# Patient Record
Sex: Female | Born: 1970
Health system: Southern US, Community
[De-identification: ages and names within clinical notes are randomized; demographics above are authoritative.]

## PROBLEM LIST (undated history)

## (undated) DIAGNOSIS — I10 Essential (primary) hypertension: Secondary | ICD-10-CM

## (undated) DIAGNOSIS — D649 Anemia, unspecified: Secondary | ICD-10-CM

## (undated) DIAGNOSIS — I2510 Atherosclerotic heart disease of native coronary artery without angina pectoris: Secondary | ICD-10-CM

## (undated) DIAGNOSIS — E119 Type 2 diabetes mellitus without complications: Secondary | ICD-10-CM

## (undated) DIAGNOSIS — F32A Depression, unspecified: Secondary | ICD-10-CM

## (undated) DIAGNOSIS — G4730 Sleep apnea, unspecified: Secondary | ICD-10-CM

## (undated) DIAGNOSIS — Z9981 Dependence on supplemental oxygen: Secondary | ICD-10-CM

## (undated) HISTORY — PX: CATARACT EXTRACTION: SUR2

## (undated) HISTORY — DX: Atherosclerotic heart disease of native coronary artery without angina pectoris: I25.10

---

## 2012-12-01 ENCOUNTER — Ambulatory Visit: Payer: Self-pay | Admitting: Family Medicine

## 2019-03-03 ENCOUNTER — Other Ambulatory Visit: Payer: Self-pay | Admitting: Family

## 2019-03-03 DIAGNOSIS — Z1231 Encounter for screening mammogram for malignant neoplasm of breast: Secondary | ICD-10-CM

## 2019-12-16 ENCOUNTER — Emergency Department (HOSPITAL_COMMUNITY)
Admission: EM | Admit: 2019-12-16 | Discharge: 2019-12-16 | Disposition: A | Discharge status: Home / Self Care | Payer: Medicare Other | Source: Home / Self Care

## 2019-12-16 ENCOUNTER — Other Ambulatory Visit: Payer: Self-pay

## 2019-12-16 ENCOUNTER — Inpatient Hospital Stay (HOSPITAL_COMMUNITY)
Admission: EM | Admit: 2019-12-16 | Discharge: 2019-12-22 | DRG: 291 | Disposition: A | Discharge status: Home Health | Payer: Medicare Other | Attending: Internal Medicine | Admitting: Internal Medicine

## 2019-12-16 ENCOUNTER — Encounter (HOSPITAL_COMMUNITY): Payer: Self-pay | Admitting: Pediatrics

## 2019-12-16 ENCOUNTER — Emergency Department (HOSPITAL_COMMUNITY): Payer: Medicare Other

## 2019-12-16 ENCOUNTER — Encounter (HOSPITAL_COMMUNITY): Payer: Self-pay

## 2019-12-16 DIAGNOSIS — Z6841 Body Mass Index (BMI) 40.0 and over, adult: Secondary | ICD-10-CM | POA: Diagnosis not present

## 2019-12-16 DIAGNOSIS — Z20822 Contact with and (suspected) exposure to covid-19: Secondary | ICD-10-CM | POA: Diagnosis present

## 2019-12-16 DIAGNOSIS — I441 Atrioventricular block, second degree: Secondary | ICD-10-CM | POA: Diagnosis present

## 2019-12-16 DIAGNOSIS — I110 Hypertensive heart disease with heart failure: Secondary | ICD-10-CM | POA: Diagnosis not present

## 2019-12-16 DIAGNOSIS — Z79899 Other long term (current) drug therapy: Secondary | ICD-10-CM | POA: Diagnosis not present

## 2019-12-16 DIAGNOSIS — E66813 Obesity, class 3: Secondary | ICD-10-CM

## 2019-12-16 DIAGNOSIS — Z7984 Long term (current) use of oral hypoglycemic drugs: Secondary | ICD-10-CM | POA: Diagnosis not present

## 2019-12-16 DIAGNOSIS — F329 Major depressive disorder, single episode, unspecified: Secondary | ICD-10-CM | POA: Diagnosis present

## 2019-12-16 DIAGNOSIS — I248 Other forms of acute ischemic heart disease: Secondary | ICD-10-CM | POA: Diagnosis present

## 2019-12-16 DIAGNOSIS — Z5321 Procedure and treatment not carried out due to patient leaving prior to being seen by health care provider: Secondary | ICD-10-CM | POA: Insufficient documentation

## 2019-12-16 DIAGNOSIS — J9601 Acute respiratory failure with hypoxia: Secondary | ICD-10-CM

## 2019-12-16 DIAGNOSIS — I509 Heart failure, unspecified: Secondary | ICD-10-CM

## 2019-12-16 DIAGNOSIS — R222 Localized swelling, mass and lump, trunk: Secondary | ICD-10-CM | POA: Insufficient documentation

## 2019-12-16 DIAGNOSIS — N1831 Chronic kidney disease, stage 3a: Secondary | ICD-10-CM | POA: Diagnosis present

## 2019-12-16 DIAGNOSIS — Z7401 Bed confinement status: Secondary | ICD-10-CM | POA: Diagnosis not present

## 2019-12-16 DIAGNOSIS — N179 Acute kidney failure, unspecified: Secondary | ICD-10-CM

## 2019-12-16 DIAGNOSIS — E119 Type 2 diabetes mellitus without complications: Secondary | ICD-10-CM

## 2019-12-16 DIAGNOSIS — R601 Generalized edema: Secondary | ICD-10-CM

## 2019-12-16 DIAGNOSIS — E1122 Type 2 diabetes mellitus with diabetic chronic kidney disease: Secondary | ICD-10-CM | POA: Diagnosis present

## 2019-12-16 DIAGNOSIS — E11649 Type 2 diabetes mellitus with hypoglycemia without coma: Secondary | ICD-10-CM | POA: Diagnosis not present

## 2019-12-16 DIAGNOSIS — I10 Essential (primary) hypertension: Secondary | ICD-10-CM

## 2019-12-16 DIAGNOSIS — I130 Hypertensive heart and chronic kidney disease with heart failure and stage 1 through stage 4 chronic kidney disease, or unspecified chronic kidney disease: Principal | ICD-10-CM | POA: Diagnosis present

## 2019-12-16 DIAGNOSIS — I5033 Acute on chronic diastolic (congestive) heart failure: Secondary | ICD-10-CM

## 2019-12-16 DIAGNOSIS — I5031 Acute diastolic (congestive) heart failure: Secondary | ICD-10-CM | POA: Diagnosis present

## 2019-12-16 DIAGNOSIS — F79 Unspecified intellectual disabilities: Secondary | ICD-10-CM

## 2019-12-16 DIAGNOSIS — I160 Hypertensive urgency: Secondary | ICD-10-CM

## 2019-12-16 DIAGNOSIS — F32A Depression, unspecified: Secondary | ICD-10-CM

## 2019-12-16 HISTORY — DX: Heart failure, unspecified: I50.9

## 2019-12-16 HISTORY — DX: Unspecified intellectual disabilities: F79

## 2019-12-16 HISTORY — DX: Type 2 diabetes mellitus without complications: E11.9

## 2019-12-16 HISTORY — DX: Morbid (severe) obesity due to excess calories: E66.01

## 2019-12-16 HISTORY — DX: Essential (primary) hypertension: I10

## 2019-12-16 HISTORY — DX: Acute diastolic (congestive) heart failure: I50.31

## 2019-12-16 HISTORY — DX: Generalized edema: R60.1

## 2019-12-16 HISTORY — DX: Acute respiratory failure with hypoxia: J96.01

## 2019-12-16 HISTORY — DX: Obesity, class 3: E66.813

## 2019-12-16 HISTORY — DX: Acute kidney failure, unspecified: N17.9

## 2019-12-16 HISTORY — DX: Hypertensive urgency: I16.0

## 2019-12-16 LAB — CBC
HCT: 34.4 % — ABNORMAL LOW (ref 36.0–46.0)
Hemoglobin: 10.3 g/dL — ABNORMAL LOW (ref 12.0–15.0)
MCH: 26 pg (ref 26.0–34.0)
MCHC: 29.9 g/dL — ABNORMAL LOW (ref 30.0–36.0)
MCV: 86.9 fL (ref 80.0–100.0)
Platelets: 355 10*3/uL (ref 150–400)
RBC: 3.96 MIL/uL (ref 3.87–5.11)
RDW: 17.8 % — ABNORMAL HIGH (ref 11.5–15.5)
WBC: 6.4 10*3/uL (ref 4.0–10.5)
nRBC: 0 % (ref 0.0–0.2)

## 2019-12-16 LAB — HEPATIC FUNCTION PANEL
ALT: 22 U/L (ref 0–44)
AST: 22 U/L (ref 15–41)
Albumin: 2.4 g/dL — ABNORMAL LOW (ref 3.5–5.0)
Alkaline Phosphatase: 43 U/L (ref 38–126)
Bilirubin, Direct: 0.2 mg/dL (ref 0.0–0.2)
Indirect Bilirubin: 0.4 mg/dL (ref 0.3–0.9)
Total Bilirubin: 0.6 mg/dL (ref 0.3–1.2)
Total Protein: 5.8 g/dL — ABNORMAL LOW (ref 6.5–8.1)

## 2019-12-16 LAB — BASIC METABOLIC PANEL
Anion gap: 9 (ref 5–15)
BUN: 20 mg/dL (ref 6–20)
CO2: 22 mmol/L (ref 22–32)
Calcium: 8.4 mg/dL — ABNORMAL LOW (ref 8.9–10.3)
Chloride: 108 mmol/L (ref 98–111)
Creatinine, Ser: 1.79 mg/dL — ABNORMAL HIGH (ref 0.44–1.00)
GFR calc Af Amer: 39 mL/min — ABNORMAL LOW (ref >60)
GFR calc non Af Amer: 33 mL/min — ABNORMAL LOW (ref >60)
Glucose, Bld: 169 mg/dL — ABNORMAL HIGH (ref 70–99)
Potassium: 4.2 mmol/L (ref 3.5–5.1)
Sodium: 139 mmol/L (ref 135–145)

## 2019-12-16 LAB — CBG MONITORING, ED: Glucose-Capillary: 86 mg/dL (ref 70–99)

## 2019-12-16 LAB — BRAIN NATRIURETIC PEPTIDE: B Natriuretic Peptide: 1325.4 pg/mL — ABNORMAL HIGH (ref 0.0–100.0)

## 2019-12-16 MED ORDER — FUROSEMIDE 10 MG/ML IJ SOLN
20.0000 mg | Freq: Once | INTRAMUSCULAR | Status: AC
  Administered 2019-12-16: 20 mg via INTRAVENOUS
  Filled 2019-12-16: qty 2

## 2019-12-16 MED ORDER — SODIUM CHLORIDE 0.9 % IV SOLN: 250.0000 mL | INTRAVENOUS | Status: DC | PRN

## 2019-12-16 MED ORDER — SODIUM CHLORIDE 0.9% FLUSH
3.0000 mL | Freq: Two times a day (BID) | INTRAVENOUS | Status: DC
  Administered 2019-12-16 – 2019-12-22 (×12): 3 mL via INTRAVENOUS

## 2019-12-16 MED ORDER — INSULIN ASPART 100 UNIT/ML ~~LOC~~ SOLN: 0.0000 [IU] | Freq: Every day | SUBCUTANEOUS | Status: DC

## 2019-12-16 MED ORDER — ACETAMINOPHEN 325 MG PO TABS
650.0000 mg | ORAL_TABLET | ORAL | Status: DC | PRN
  Administered 2019-12-17 (×2): 650 mg via ORAL
  Filled 2019-12-16 (×2): qty 2

## 2019-12-16 MED ORDER — SODIUM CHLORIDE 0.9% FLUSH: 3.0000 mL | INTRAVENOUS | Status: DC | PRN

## 2019-12-16 MED ORDER — ASPIRIN EC 81 MG PO TBEC
81.0000 mg | DELAYED_RELEASE_TABLET | Freq: Every day | ORAL | Status: DC
  Administered 2019-12-17 – 2019-12-22 (×6): 81 mg via ORAL
  Filled 2019-12-16 (×6): qty 1

## 2019-12-16 MED ORDER — ENOXAPARIN SODIUM 40 MG/0.4ML ~~LOC~~ SOLN
40.0000 mg | Freq: Every day | SUBCUTANEOUS | Status: DC
  Administered 2019-12-17 – 2019-12-22 (×6): 40 mg via SUBCUTANEOUS
  Filled 2019-12-16 (×6): qty 0.4

## 2019-12-16 MED ORDER — FUROSEMIDE 10 MG/ML IJ SOLN
60.0000 mg | Freq: Two times a day (BID) | INTRAMUSCULAR | Status: DC
  Administered 2019-12-16 – 2019-12-20 (×9): 60 mg via INTRAVENOUS
  Filled 2019-12-16 (×10): qty 6

## 2019-12-16 MED ORDER — SODIUM CHLORIDE 0.9% FLUSH: 3.0000 mL | Freq: Once | INTRAVENOUS | Status: DC

## 2019-12-16 MED ORDER — HYDRALAZINE HCL 25 MG PO TABS
25.0000 mg | ORAL_TABLET | Freq: Three times a day (TID) | ORAL | Status: DC
  Administered 2019-12-16 – 2019-12-18 (×6): 25 mg via ORAL
  Filled 2019-12-16 (×6): qty 1

## 2019-12-16 MED ORDER — INSULIN ASPART 100 UNIT/ML ~~LOC~~ SOLN: 0.0000 [IU] | Freq: Three times a day (TID) | SUBCUTANEOUS | Status: DC

## 2019-12-16 MED ORDER — ONDANSETRON HCL 4 MG/2ML IJ SOLN: 4.0000 mg | Freq: Four times a day (QID) | INTRAMUSCULAR | Status: DC | PRN

## 2019-12-16 NOTE — ED Triage Notes (Signed)
Patient c/o abdominal swelling and redness; patient concern for bed bite. Noted patient is edematous on bilateral legs as well.

## 2019-12-16 NOTE — ED Provider Notes (Signed)
[facility] EMERGENCY DEPARTMENT Provider Note   CSN: [id number] Arrival date & time: 12/16/19  1952     History No chief complaint on file.   Joanna [name] is a 49 y.o. female presenting for evaluation of shortness of breath.  Patient states for the past several days, she has had increased shortness of breath.  She also reports abdominal swelling as well as bilateral leg swelling.  She was in the ER earlier today, but left without being seen.  She went to an urgent care, was found to be hypoxic, thus sent to the ER.  She reports a history of diabetes and hypertension for which he takes medication, no other medical problems.  She denies recent fever, chills, cough, chest pain, nausea, vomiting, abd pain, urinary symptoms, normal bowel movements.  She denies a previous history of heart failure, is not on any fluid pills.  Patient reports shortness of breath is worse with exertion.  She states she never lays flat, does not know if she has orthopnea.  Additional history obtained from chart review.  Reviewed labs and x-ray from earlier today.  HPI     Past Medical History:  Diagnosis Date  . Diabetes mellitus without complication ([place])   . Hypertension     There are no problems to display for this patient.   History reviewed. No pertinent surgical history.   OB History   No obstetric history on file.     History reviewed. No pertinent family history.  Social History   Tobacco Use  . Smoking status: Never Smoker  . Smokeless tobacco: Never Used  Substance Use Topics  . Alcohol use: Not Currently  . Drug use: Not Currently    Home Medications Prior to Admission medications   Not on File    Allergies    Patient has no known allergies.  Review of Systems   Review of Systems  Respiratory: Positive for shortness of breath.   Cardiovascular: Positive for leg swelling.  Gastrointestinal: Positive for abdominal distention.  All other systems reviewed  and are negative.   Physical Exam Updated Vital Signs BP (!) 190/95   Pulse (!) 103   Temp 97.7 F (36.5 C) (Oral)   Resp 19   SpO2 95%   Physical Exam Vitals and nursing note reviewed.  Constitutional:      General: She is not in acute distress.    Appearance: She is well-developed.     Comments: Appears nontoxic  HENT:     Head: Normocephalic and atraumatic.  Eyes:     Conjunctiva/sclera: Conjunctivae normal.     Pupils: Pupils are equal, round, and reactive to light.  Cardiovascular:     Rate and Rhythm: Normal rate and regular rhythm.     Pulses: Normal pulses.  Pulmonary:     Effort: Pulmonary effort is normal. No respiratory distress.     Breath sounds: Normal breath sounds. No wheezing.     Comments: Rales in lower lobes bilaterally Abdominal:     General: There is distension.     Palpations: Abdomen is soft. There is no mass.     Tenderness: There is no abdominal tenderness. There is no guarding or rebound.     Comments: abd distended, nontender  Musculoskeletal:        General: Normal range of motion.     Cervical back: Normal range of motion and neck supple.     Right lower leg: Edema present.     Left lower leg:  Edema present.     Comments: Bilateral pitting edema. Symmetrical.   Skin:    General: Skin is warm and dry.     Capillary Refill: Capillary refill takes less than 2 seconds.  Neurological:     Mental Status: She is alert and oriented to person, place, and time.     ED Results / Procedures / Treatments   Labs (all labs ordered are listed, but only abnormal results are displayed) Labs Reviewed  HEPATIC FUNCTION PANEL - Abnormal; Notable for the following components:      Result Value   Total Protein 5.8 (*)    Albumin 2.4 (*)    All other components within normal limits  SARS CORONAVIRUS 2 BY RT PCR (HOSPITAL ORDER, [facility])    EKG None  Radiology DG Chest 2 View  Result Date: 12/16/2019 CLINICAL DATA:   Provided history: Swelling. Additional history provided: Patient reports abdominal swelling and redness; concern for bug bite, bilateral leg swelling and shortness of breath. EXAM: CHEST - 2 VIEW COMPARISON:  No pertinent prior studies available for comparison. FINDINGS: A metallic clothes pin projects in the region of the left hemithorax. Shallow inspiration radiograph with accentuation of heart size. There is apparent mild cardiomegaly. There is prominence of the interstitial lung markings within the mid to lower lung fields. No definite pleural effusion. No evidence of pneumothorax. No acute bony abnormality identified. IMPRESSION: Shallow inspiration radiograph. Prominence of the interstitial lung markings within the mid to lower lung field, which may reflect atelectasis, interstitial edema or atypical/viral pneumonia. Mild cardiomegaly. A metallic clothes pin projects in the region of the left hemithorax. This is presumed external to the patient. However, clinical correlation is necessary. Electronically Signed   By: Kellie Simmering DO   On: 12/16/2019 14:07    Procedures Procedures (including critical care time)  Medications Ordered in ED Medications  furosemide (LASIX) injection 20 mg (20 mg Intravenous Given 12/16/19 2022)    ED Course  I have reviewed the triage vital signs and the nursing notes.  Pertinent labs & imaging results that were available during my care of the patient were reviewed by me and considered in my medical decision making (see chart for details).    MDM Rules/Calculators/A&P                          Patient presenting for evaluation of shortness of breath, leg swelling, abdominal swelling.  On exam, patient appears nontoxic.  She does have some mild rales in the bilateral lower bases.  Clearly has edema of the lower extremities extending to her stomach.  Per EMS, patient was hypoxic on arrival, this improved with 2 L via nasal cannula.  Sats maintaining in the ER on room  air.  Patient is mildly tachycardic.  Exam consistent with new onset heart failure.  I reviewed labs from earlier today, patient was found to have an elevated BNP at 1500.  Mildly elevated creatinine at 1.79.  EKG nonischemic.  Chest x-ray viewed interpreted by me, shows interstitial fluid consistent with pulmonary edema.  Electrolytes stable.  Will add on LFTs.  Will give Lasix and reassess.  On reassessment, patient reports improvement with Lasix.  Considering she has new onset heart failure, episodes of hypoxia, and pulmonary edema, will call for admission.  Case discussed with attending, Dr. Tyrone Nine evaluated the patient.  Discussed with Dr. Damita Dunnings from [facility] hospital[facility], patient to be admitted.  Final  Clinical Impression(s) / ED Diagnoses Final diagnoses:  New onset of congestive heart failure [facility])    Rx / DC Orders ED Discharge Orders    None       Franchot Heidelberg, PA-C 12/16/19 2310    Deno Etienne, DO 12/16/19 2337

## 2019-12-16 NOTE — ED Triage Notes (Signed)
Pt bib ems, coming from [facility]. Was beeing seen for shortness of breath and abdominal itching. Pt presents with diffuse edema and pt was satting 88% on ra.   170/100 90hr 100% on 2L 90cbg

## 2019-12-16 NOTE — ED Notes (Signed)
Pt decided to leave the emergency department

## 2019-12-16 NOTE — H&P (Signed)
History and Physical    Joanna [name] CBU:[id number] DOB: [birth year] DOA: 12/16/2019  PCP: Marliss Coots, NP   Patient coming from: Home  I have personally briefly reviewed patient's old medical records in [place]  Chief Complaint: Swelling in the legs and abdomen, shortness of breath, hypoxia sent from urgent care  HPI: Joanna [name] is a 49 y.o. female with medical history significant for diabetes, hypertension, morbid obesity and intellectual disability/illiteracy who presents to the emergency room from urgent care with hypoxia with O2 sats 88% and concern for congestive heart failure.  Patient initially presented to [facility] emergency room gloomy in the day but left without being seen and went to the urgent care.  History is taken from patient, but also from her niece and caregiver and her caregivers mother.  Patient is intellectually disabled and is unable to read and is cared for by her sister.  Patient has been having lower extremity edema for several months and over the past 2 weeks she started having swelling in the lower abdomen.  Neither patient or family noticed shortness of breath but when questioned further, patient states she sometimes gets out of breath when she is walking in the home.  She mentions that she got bitten by an insect a few days ago on her lower abdomen and her aunt said she had been treating it with Benadryl and topical hydrocortisone.  She has had no fever or chills.  Reported no cough.  Patient's blood pressure was noted to be high in urgent care and she did say that her blood pressure is usually up when she goes to the doctor.  The aunt said they spoke to the doctor about the swelling that has been ongoing for months but no fluid pill have been prescribed.  She denies nausea or vomiting, abdominal pain or change in bowel habits.  Denies dysuria.  History is limited due to patient being mentally challenged.   ED Course: Patient arrived to the ER on O2 at 2 L with sats of  95%.  Blood pressure 190/95 heart rate 103.  She was afebrile.  Blood work revealed BNP of 1324, creatinine of 1.79.  Normal liver function.  WBC 6.4, hemoglobin 10.3.  Troponin not done in the ER.  EKG showed sinus rhythm at a rate of 98 with no acute ST-T wave changes.  Chest x-ray showed mild cardiomegaly, prominence of the interstitial lung markings reflecting either atelectasis and interstitial edema or atypical/viral pneumonia.  Patient was given a dose of Lasix.  Hospitalist consulted for admission.   Review of Systems: As per HPI otherwise all other systems on review of systems negative.    Past Medical History:  Diagnosis Date  . Diabetes mellitus without complication ([place])   . Hypertension     History reviewed. No pertinent surgical history.   reports that she has never smoked. She has never used smokeless tobacco. She reports previous alcohol use. She reports previous drug use.  No Known Allergies  History reviewed. Patient unaware of family history, limited due to mental disability   Prior to Admission medications   Medication Sig Start Date End Date Taking? Authorizing Provider  amLODipine (NORVASC) 10 MG tablet Take 10 mg by mouth daily.    [provider]  glimepiride (AMARYL) 2 MG tablet Take 2 mg by mouth daily with breakfast.    [provider]  hydrochlorothiazide (HYDRODIURIL) 25 MG tablet Take 25 mg by mouth daily.    [provider]  metformin (FORTAMET) 500 MG (OSM) 24 hr tablet Take 500 mg by mouth daily with breakfast.    [provider]    Physical Exam: Vitals:   12/16/19 2016 12/16/19 2019  BP: (!) 190/95   Pulse: (!) 103   Resp: 19   Temp:  97.7 F (36.5 C)  TempSrc:  Oral  SpO2: 95%      Vitals:   12/16/19 2016 12/16/19 2019  BP: (!) 190/95   Pulse: (!) 103   Resp: 19   Temp:  97.7 F (36.5 C)  TempSrc:  Oral  SpO2: 95%     Constitutional: Alert and oriented x 2 .  Conversational dyspnea HEENT:       Head: Normocephalic and atraumatic.         Eyes: PERLA, EOMI, Conjunctivae are normal. Sclera is non-icteric.       Mouth/Throat: Mucous membranes are moist.       Neck: Supple with no signs of meningismus. Cardiovascular: Regular rate and rhythm. No murmurs, gallops, or rubs. 2+ symmetrical distal pulses are present upper extremities.  Difficult to locate lower extremities due to edema. No JVD. 4+ pitting LE edema Respiratory: Respiratory effort increased.Lungs sounds diminished bilaterally.  Basilar wheezes and crackles Gastrointestinal:  Marked abdominal wall edema.  Soft, non tender, and, positive bowel sounds. No rebound or guarding. Genitourinary: No CVA tenderness. Musculoskeletal: Nontender with normal range of motion in all extremities.  Marked edema up to thighs. No cyanosis, or erythema of extremities. Neurologic: Normal speech and language. Face is symmetric. Moving all extremities. No gross focal neurologic deficits . Skin: Skin is warm, dry.  No rash or ulcers Psychiatric: Mood and affect are normal Speech and behavior are normal   Labs on Admission: I have personally reviewed following labs and imaging studies  CBC: Recent Labs  Lab 12/16/19 1339  WBC 6.4  HGB 10.3*  HCT 34.4*  MCV 86.9  PLT 159   Basic Metabolic Panel: Recent Labs  Lab 12/16/19 1339  NA 139  K 4.2  CL 108  CO2 22  GLUCOSE 169*  BUN 20  CREATININE 1.79*  CALCIUM 8.4*   GFR: CrCl cannot be calculated (Unknown ideal weight.). Liver Function Tests: Recent Labs  Lab 12/16/19 2006  AST 22  ALT 22  ALKPHOS 43  BILITOT 0.6  PROT 5.8*  ALBUMIN 2.4*   No results for input(s): LIPASE, AMYLASE in the last 168 hours. No results for input(s): AMMONIA in the last 168 hours. Coagulation Profile: No results for input(s): INR, PROTIME in the last 168 hours. Cardiac Enzymes: No results for input(s): CKTOTAL, CKMB, CKMBINDEX, TROPONINI in the last 168 hours. BNP (last 3 results) No results for  input(s): PROBNP in the last 8760 hours. HbA1C: No results for input(s): HGBA1C in the last 72 hours. CBG: No results for input(s): GLUCAP in the last 168 hours. Lipid Profile: No results for input(s): CHOL, HDL, LDLCALC, TRIG, CHOLHDL, LDLDIRECT in the last 72 hours. Thyroid Function Tests: No results for input(s): TSH, T4TOTAL, FREET4, T3FREE, THYROIDAB in the last 72 hours. Anemia Panel: No results for input(s): VITAMINB12, FOLATE, FERRITIN, TIBC, IRON, RETICCTPCT in the last 72 hours. Urine analysis: No results found for: COLORURINE, APPEARANCEUR, LABSPEC, PHURINE, GLUCOSEU, HGBUR, BILIRUBINUR, KETONESUR, PROTEINUR, UROBILINOGEN, NITRITE, LEUKOCYTESUR  Radiological Exams on Admission: DG Chest 2 View  Result Date: 12/16/2019 CLINICAL DATA:  Provided history: Swelling. Additional history provided: Patient reports abdominal swelling and redness; concern for bug bite, bilateral leg swelling and shortness of breath.  EXAM: CHEST - 2 VIEW COMPARISON:  No pertinent prior studies available for comparison. FINDINGS: A metallic clothes pin projects in the region of the left hemithorax. Shallow inspiration radiograph with accentuation of heart size. There is apparent mild cardiomegaly. There is prominence of the interstitial lung markings within the mid to lower lung fields. No definite pleural effusion. No evidence of pneumothorax. No acute bony abnormality identified. IMPRESSION: Shallow inspiration radiograph. Prominence of the interstitial lung markings within the mid to lower lung field, which may reflect atelectasis, interstitial edema or atypical/viral pneumonia. Mild cardiomegaly. A metallic clothes pin projects in the region of the left hemithorax. This is presumed external to the patient. However, clinical correlation is necessary. Electronically Signed   By: Kellie Simmering DO   On: 12/16/2019 14:07    EKG: Independently reviewed. Interpretation : Normal sinus rhythm rate 98 with no acute ST-T  wave changes  Assessment/Plan 49 year old female with medical history significant for diabetes, hypertension, morbid obesity and intellectual disability/illiteracy who presents to the emergency room from urgent care with a several month history of progressively worsening lower extremity swelling now involving the abdomen and breast, associated with elevated blood pressure, hypoxia with O2 sats 88%.  Work-up consistent with congestive heart failure    New onset of congestive heart failure with acute exacerbation ([place])   Anasarca -Patient presents with 4+ edema, interstitial edema on chest x-ray BNP over 1300 -Likely related to uncontrolled hypertension, but possible wide differential -Follow sed rate and TSH -Follow troponins, not ordered from the ER, although no chest pain no acute changes on EKG -IV Lasix -Blood pressure control with hydralazine for now.  No ACE/ARB due to AKI.  No beta-blocker due to acute heart failure -Daily weights, intake and output monitoring -Echocardiogram in the a.m. -Cardiology consult in the a.m.  Acute respiratory failure with hypoxia ([facility]) -O2 sat 88% on room air with EMS -Secondary to above -Continue O2 to keep sats over 94% and wean as tolerated  Hypertensive urgency -BP 190/95 -Hydralazine 25 mg 3 times daily -We will hold home amlodipine due to anasarca.  Will hold home HCTZ    Type 2 diabetes mellitus without complication ([facility]) -Sliding scale insulin coverage pending med rec.  Holding oral glimepiride and Metformin    AKI (acute kidney injury) ([place]) -Creatinine 1.79 -Renal sonogram ordered    Obesity, Class III, BMI 40-49.9 (morbid obesity) ([place]) -This complicates overall prognosis and care    Intellectual disability with illiteracy -Patient aunt that she is intellectually delayed and unable to read -Increase nursing assistance with written communication   DVT prophylaxis: Lovenox  Code Status: full code  Family Communication: Naaman Plummer at [phone] Disposition Plan: Back to previous home environment Consults called: none  Status:At the time of admission, it appears that the appropriate admission status for this patient is INPATIENT. This is judged to be reasonable and necessary in order to provide the required intensity of service to ensure the patient's safety given the presenting symptoms, physical exam findings, and initial radiographic and laboratory data in the context of their  Comorbid conditions.   Patient requires inpatient status due to high intensity of service, high risk for further deterioration and high frequency of surveillance required.   I certify that at the point of admission it is my clinical judgment that the patient will require inpatient hospital care spanning beyond Between [facility]     12/16/2019, 10:19 PM

## 2019-12-17 ENCOUNTER — Inpatient Hospital Stay (HOSPITAL_COMMUNITY): Payer: Medicare Other

## 2019-12-17 DIAGNOSIS — I5033 Acute on chronic diastolic (congestive) heart failure: Secondary | ICD-10-CM

## 2019-12-17 DIAGNOSIS — R601 Generalized edema: Secondary | ICD-10-CM

## 2019-12-17 DIAGNOSIS — J9601 Acute respiratory failure with hypoxia: Secondary | ICD-10-CM

## 2019-12-17 LAB — CBC
HCT: 34.7 % — ABNORMAL LOW (ref 36.0–46.0)
Hemoglobin: 10.1 g/dL — ABNORMAL LOW (ref 12.0–15.0)
MCH: 25.6 pg — ABNORMAL LOW (ref 26.0–34.0)
MCHC: 29.1 g/dL — ABNORMAL LOW (ref 30.0–36.0)
MCV: 87.8 fL (ref 80.0–100.0)
Platelets: 337 10*3/uL (ref 150–400)
RBC: 3.95 MIL/uL (ref 3.87–5.11)
RDW: 17.8 % — ABNORMAL HIGH (ref 11.5–15.5)
WBC: 6.8 10*3/uL (ref 4.0–10.5)
nRBC: 0 % (ref 0.0–0.2)

## 2019-12-17 LAB — CBG MONITORING, ED
Glucose-Capillary: 78 mg/dL (ref 70–99)
Glucose-Capillary: 68 mg/dL — ABNORMAL LOW (ref 70–99)
Glucose-Capillary: 64 mg/dL — ABNORMAL LOW (ref 70–99)
Glucose-Capillary: 38 mg/dL — CL (ref 70–99)
Glucose-Capillary: 62 mg/dL — ABNORMAL LOW (ref 70–99)
Glucose-Capillary: 78 mg/dL (ref 70–99)
Glucose-Capillary: 48 mg/dL — ABNORMAL LOW (ref 70–99)
Glucose-Capillary: 65 mg/dL — ABNORMAL LOW (ref 70–99)
Glucose-Capillary: 58 mg/dL — ABNORMAL LOW (ref 70–99)

## 2019-12-17 LAB — BASIC METABOLIC PANEL
Anion gap: 10 (ref 5–15)
BUN: 24 mg/dL — ABNORMAL HIGH (ref 6–20)
CO2: 22 mmol/L (ref 22–32)
Calcium: 8.4 mg/dL — ABNORMAL LOW (ref 8.9–10.3)
Chloride: 108 mmol/L (ref 98–111)
Creatinine, Ser: 1.86 mg/dL — ABNORMAL HIGH (ref 0.44–1.00)
GFR calc Af Amer: 37 mL/min — ABNORMAL LOW (ref >60)
GFR calc non Af Amer: 32 mL/min — ABNORMAL LOW (ref >60)
Glucose, Bld: 83 mg/dL (ref 70–99)
Potassium: 4.9 mmol/L (ref 3.5–5.1)
Sodium: 140 mmol/L (ref 135–145)

## 2019-12-17 LAB — HIV ANTIBODY (ROUTINE TESTING W REFLEX): HIV Screen 4th Generation wRfx: NONREACTIVE

## 2019-12-17 LAB — GLUCOSE, CAPILLARY
Glucose-Capillary: 41 mg/dL — CL (ref 70–99)
Glucose-Capillary: 61 mg/dL — ABNORMAL LOW (ref 70–99)
Glucose-Capillary: 68 mg/dL — ABNORMAL LOW (ref 70–99)

## 2019-12-17 LAB — HEMOGLOBIN A1C
Hgb A1c MFr Bld: 7.4 % — ABNORMAL HIGH (ref 4.8–5.6)
Mean Plasma Glucose: 165.68 mg/dL

## 2019-12-17 LAB — C-REACTIVE PROTEIN: CRP: 4.6 mg/dL — ABNORMAL HIGH (ref <1.0)

## 2019-12-17 LAB — SARS CORONAVIRUS 2 BY RT PCR (HOSPITAL ORDER, PERFORMED IN ~~LOC~~ HOSPITAL LAB): SARS Coronavirus 2: NEGATIVE

## 2019-12-17 LAB — TROPONIN I (HIGH SENSITIVITY): Troponin I (High Sensitivity): 31 ng/L — ABNORMAL HIGH (ref <18)

## 2019-12-17 LAB — TSH: TSH: 2.496 u[IU]/mL (ref 0.350–4.500)

## 2019-12-17 MED ORDER — INSULIN ASPART 100 UNIT/ML ~~LOC~~ SOLN
0.0000 [IU] | Freq: Three times a day (TID) | SUBCUTANEOUS | Status: DC
  Administered 2019-12-19: 1 [IU] via SUBCUTANEOUS
  Administered 2019-12-19 (×2): 2 [IU] via SUBCUTANEOUS
  Administered 2019-12-20: 1 [IU] via SUBCUTANEOUS
  Administered 2019-12-20 – 2019-12-21 (×3): 2 [IU] via SUBCUTANEOUS
  Administered 2019-12-21: 1 [IU] via SUBCUTANEOUS
  Administered 2019-12-22: 2 [IU] via SUBCUTANEOUS

## 2019-12-17 MED ORDER — INSULIN ASPART 100 UNIT/ML ~~LOC~~ SOLN: 0.0000 [IU] | Freq: Every day | SUBCUTANEOUS | Status: DC

## 2019-12-17 MED ORDER — DEXTROSE 10 % IV SOLN: INTRAVENOUS | Status: DC

## 2019-12-17 MED ORDER — DEXTROSE 50 % IV SOLN
INTRAVENOUS | Status: AC
  Filled 2019-12-17: qty 50

## 2019-12-17 NOTE — ED Notes (Signed)
Gave patient some orange juice and apple sauce patient is resting with call bell in reach

## 2019-12-17 NOTE — ED Notes (Signed)
Tele  Breakfast Ordered

## 2019-12-17 NOTE — Progress Notes (Signed)
PROGRESS NOTE    Joanna [name]  JKD:[id number] DOB: [birth year] DOA: 12/16/2019 PCP: Marliss Coots, NP    Brief Narrative:  49 y.o. female with medical history significant for diabetes, hypertension, morbid obesity and intellectual disability/illiteracy who presents to the emergency room from urgent care with hypoxia with O2 sats 88% and concern for congestive heart failure.  Patient initially presented to [facility] emergency room gloomy in the day but left without being seen and went to the urgent care.  History is taken from patient, but also from her niece and caregiver and her caregivers mother.  Patient is intellectually disabled and is unable to read and is cared for by her sister.  Patient has been having lower extremity edema for several months and over the past 2 weeks she started having swelling in the lower abdomen.  Neither patient or family noticed shortness of breath but when questioned further, patient states she sometimes gets out of breath when she is walking in the home.  She mentions that she got bitten by an insect a few days ago on her lower abdomen and her aunt said she had been treating it with Benadryl and topical hydrocortisone.  She has had no fever or chills.  Reported no cough.  Patient's blood pressure was noted to be high in urgent care and she did say that her blood pressure is usually up when she goes to the doctor.  The aunt said they spoke to the doctor about the swelling that has been ongoing for months but no fluid pill have been prescribed.  She denies nausea or vomiting, abdominal pain or change in bowel habits.  Denies dysuria.  History is limited due to patient being mentally challenged.   ED Course: Patient arrived to the ER on O2 at 2 L with sats of 95%.  Blood pressure 190/95 heart rate 103.  She was afebrile.  Blood work revealed BNP of 1324, creatinine of 1.79.  Normal liver function.  WBC 6.4, hemoglobin 10.3.  Troponin not done in the ER.  EKG showed sinus rhythm at a  rate of 98 with no acute ST-T wave changes.  Chest x-ray showed mild cardiomegaly, prominence of the interstitial lung markings reflecting either atelectasis and interstitial edema or atypical/viral pneumonia.  Patient was given a dose of Lasix.  Hospitalist consulted for admission.  Assessment & Plan:   Principal Problem:   New onset of congestive heart failure (HCC) Active Problems:   HTN (hypertension)   Type 2 diabetes mellitus without complication (HCC)   Anasarca   AKI (acute kidney injury) ([place])   Obesity, Class III, BMI 40-49.9 (morbid obesity) ([place])   Depression   Intellectual disability   Acute diastolic heart failure (HCC)   Acute respiratory failure with hypoxia (HCC)   Hypertensive urgency   New onset of congestive heart failure with acute exacerbation ([place])   Anasarca -Patient presents with 4+ edema, interstitial edema on chest x-ray BNP over 1300 -Will continue with hydralazine for now for bp management.  No ACE/ARB due to AKI.  No beta-blocker due to acute heart failure -Will continue with lasix 38m IV bid -Echocardiogram performed, pending results -repeat bmet in AM  Acute respiratory failure with hypoxia ([place] -O2 sat 88% on room air with EMS -Secondary to above -weaned to room air this afternoon  Hypertensive urgency -BP 190/95 at presentation -continued with Hydralazine 25 mg 3 times daily -Holding home amlodipine due to anasarca.  Holding home HCTZ -bp currently much better controlled  Type 2 diabetes mellitus without complication (HCC) -Sliding scale insulin coverage pending med rec.  Holding oral glimepiride and Metformin while in hospital -Hypoglycemic this afternoon, will start D10 at 20cc/hr    AKI (acute kidney injury) ([place]) -Creatinine 1.79 at presentation -Repeat bmet in AM    Obesity, Class III, BMI 40-49.9 (morbid obesity) ([place]) -This complicates overall prognosis and care    Intellectual disability with illiteracy -Patient  aunt that she is intellectually delayed and unable to read -Increase nursing assistance with written communication  DVT prophylaxis: Lovenox subq Code Status: Full Family Communication: Pt in room, family not at bedside  Status is: Inpatient  Remains inpatient appropriate because:IV treatments appropriate due to intensity of illness or inability to take PO   Dispo: The patient is from: Home              Anticipated d/c is to: Home              Anticipated d/c date is: 3 days              Patient currently is not medically stable to d/c.       Consultants:     Procedures:     Antimicrobials: Anti-infectives (From admission, onward)   None       Subjective: Reports feeling better. Still swollen all over  Objective: Vitals:   12/16/19 2343 12/17/19 1017 12/17/19 1616 12/17/19 1620  BP: 131/73 (!) 142/79 135/78   Pulse: 84 75 90 90  Resp: 16 14 (!) 33 (!) 21  Temp:      TempSrc:      SpO2: 95% 100% 100% 100%   No intake or output data in the 24 hours ending 12/17/19 1745 There were no vitals filed for this visit.  Examination:  General exam: Appears calm and comfortable  Respiratory system: Clear to auscultation. Respiratory effort normal. Cardiovascular system: S1 & S2 heard, Regular Gastrointestinal system: Abdomen is nondistended, anasarca Central nervous system: Alert and oriented. No focal neurological deficits. Extremities: Symmetric 5 x 5 power gross anasarca throughout Skin: No rashes, lesions Psychiatry: Judgement and insight appear normal. Mood & affect appropriate.   Data Reviewed: I have personally reviewed following labs and imaging studies  CBC: Recent Labs  Lab 12/16/19 1339 12/17/19 0758  WBC 6.4 6.8  HGB 10.3* 10.1*  HCT 34.4* 34.7*  MCV 86.9 87.8  PLT 355 782   Basic Metabolic Panel: Recent Labs  Lab 12/16/19 1339 12/17/19 0758  NA 139 140  K 4.2 4.9  CL 108 108  CO2 22 22  GLUCOSE 169* 83  BUN 20 24*  CREATININE  1.79* 1.86*  CALCIUM 8.4* 8.4*   GFR: CrCl cannot be calculated (Unknown ideal weight.). Liver Function Tests: Recent Labs  Lab 12/16/19 2006  AST 22  ALT 22  ALKPHOS 43  BILITOT 0.6  PROT 5.8*  ALBUMIN 2.4*   No results for input(s): LIPASE, AMYLASE in the last 168 hours. No results for input(s): AMMONIA in the last 168 hours. Coagulation Profile: No results for input(s): INR, PROTIME in the last 168 hours. Cardiac Enzymes: No results for input(s): CKTOTAL, CKMB, CKMBINDEX, TROPONINI in the last 168 hours. BNP (last 3 results) No results for input(s): PROBNP in the last 8760 hours. HbA1C: Recent Labs    12/17/19 0758  HGBA1C 7.4*   CBG: Recent Labs  Lab 12/17/19 1509 12/17/19 1535 12/17/19 1602 12/17/19 1637 12/17/19 1712  GLUCAP 38* 78 62* 48* 65*   Lipid Profile: No  results for input(s): CHOL, HDL, LDLCALC, TRIG, CHOLHDL, LDLDIRECT in the last 72 hours. Thyroid Function Tests: Recent Labs    12/17/19 0758  TSH 2.496   Anemia Panel: No results for input(s): VITAMINB12, FOLATE, FERRITIN, TIBC, IRON, RETICCTPCT in the last 72 hours. Sepsis Labs: No results for input(s): PROCALCITON, LATICACIDVEN in the last 168 hours.  Recent Results (from the past 240 hour(s))  SARS Coronavirus 2 by RT PCR (hospital order, performed in [facility] hospital lab) Nasopharyngeal Nasopharyngeal Swab     Status: None   Collection Time: 12/16/19  9:32 PM   Specimen: Nasopharyngeal Swab  Result Value Ref Range Status   SARS Coronavirus 2 NEGATIVE NEGATIVE Final    Comment: (NOTE) SARS-CoV-2 target nucleic acids are NOT DETECTED.  The SARS-CoV-2 RNA is generally detectable in upper and lower respiratory specimens during the acute phase of infection. The lowest concentration of SARS-CoV-2 viral copies this assay can detect is 250 copies / mL. A negative result does not preclude SARS-CoV-2 infection and should not be used as the sole basis for treatment or other patient  management decisions.  A negative result may occur with improper specimen collection / handling, submission of specimen other than nasopharyngeal swab, presence of viral mutation(s) within the areas targeted by this assay, and inadequate number of viral copies (<250 copies / mL). A negative result must be combined with clinical observations, patient history, and epidemiological information.  Fact Sheet for Patients:   StrictlyIdeas.no  Fact Sheet for Healthcare Providers: BankingDealers.co.za  This test is not yet approved or  cleared by the [place] FDA and has been authorized for detection and/or diagnosis of SARS-CoV-2 by FDA under an Emergency Use Authorization (EUA).  This EUA will remain in effect (meaning this test can be used) for the duration of the COVID-19 declaration under Section 564(b)(1) of the Act, 21 U.S.C. section 360bbb-3(b)(1), unless the authorization is terminated or revoked sooner.  Performed at [facility], [place] [address] [place], [place] [address]      Radiology Studies: DG Chest 2 View  Result Date: 12/16/2019 CLINICAL DATA:  Provided history: Swelling. Additional history provided: Patient reports abdominal swelling and redness; concern for bug bite, bilateral leg swelling and shortness of breath. EXAM: CHEST - 2 VIEW COMPARISON:  No pertinent prior studies available for comparison. FINDINGS: A metallic clothes pin projects in the region of the left hemithorax. Shallow inspiration radiograph with accentuation of heart size. There is apparent mild cardiomegaly. There is prominence of the interstitial lung markings within the mid to lower lung fields. No definite pleural effusion. No evidence of pneumothorax. No acute bony abnormality identified. IMPRESSION: Shallow inspiration radiograph. Prominence of the interstitial lung markings within the mid to lower lung field, which may reflect atelectasis,  interstitial edema or atypical/viral pneumonia. Mild cardiomegaly. A metallic clothes pin projects in the region of the left hemithorax. This is presumed external to the patient. However, clinical correlation is necessary. Electronically Signed   By: Kellie Simmering DO   On: 12/16/2019 14:07    Scheduled Meds: . aspirin EC  81 mg Oral Daily  . enoxaparin (LOVENOX) injection  40 mg Subcutaneous Daily  . furosemide  60 mg Intravenous Q12H  . hydrALAZINE  25 mg Oral Q8H  . insulin aspart  0-5 Units Subcutaneous QHS  . insulin aspart  0-9 Units Subcutaneous TID WC  . sodium chloride flush  3 mL Intravenous Q12H   Continuous Infusions: . sodium chloride    . dextrose  20 mL/hr at 12/17/19 1645     LOS: 1 day   Marylu Lund, MD [facility] Pager On Amion  If 7PM-7AM, please contact night-coverage 12/17/2019, 5:45 PM

## 2019-12-17 NOTE — Progress Notes (Signed)
  Echocardiogram 2D Echocardiogram has been performed.  Joanna [name] 12/17/2019, 5:29 PM

## 2019-12-17 NOTE — Progress Notes (Signed)
Inpatient Diabetes Program Recommendations  AACE/ADA: New Consensus Statement on Inpatient Glycemic Control (2015)  Target Ranges:  Prepandial:   less than 140 mg/dL      Peak postprandial:   less than 180 mg/dL (1-2 hours)      Critically ill patients:  140 - 180 mg/dL   Lab Results  Component Value Date   GLUCAP 68 (L) 12/17/2019   HGBA1C 7.4 (H) 12/17/2019    Review of Glycemic Control Results for Joanna [name], Joanna [name] (MRN [id number]) as of 12/17/2019 12:20  Ref. Range 12/16/2019 22:47 12/17/2019 08:02 12/17/2019 12:06  Glucose-Capillary Latest Ref Range: 70 - 99 mg/dL 86 78 68 (L)   Diabetes history: DM 2 Outpatient Diabetes medications: Fortamet (metformin) 500 mg Daily, Amaryl 2 mg Daily Current orders for Inpatient glycemic control:  Novolog 0-20 units tid + hs  Inpatient Diabetes Program Recommendations:    Elevated renal function. Glucose trends below 100, only on oral meds at home. Hypoglycemia 68.  -  Consider reducing Novolog Correction scale to 0-9 units tid + hs.  Thanks,  Tama Headings RN, MSN, BC-ADM Inpatient Diabetes Coordinator Team Pager [phone] (8a-5p)

## 2019-12-17 NOTE — ED Notes (Signed)
Checked patient cbg it was 56 notified RN of blood sugar gave patient her lunch tray patient is resting wth call bell in reach

## 2019-12-17 NOTE — ED Notes (Signed)
Report called to [place]

## 2019-12-17 NOTE — ED Notes (Signed)
PT BG 38. Amp D50 administered IV. Pt asymptomatic. A&Ox4, NAD. Pt provided with juice, crackers, and sandwich at this time. Will monitor.

## 2019-12-17 NOTE — Progress Notes (Addendum)
Patient arrived to unit, is alert and oriented X4, denies any complaints at this time. Current vitals are as follows:   12/17/19 1834  Vitals  Temp 98.7 F (37.1 C)  Temp Source Oral  BP (!) 153/105  MAP (mmHg) 119  BP Location Left Arm  BP Method Automatic  Patient Position (if appropriate) Lying  Pulse Rate 89  Pulse Rate Source Monitor  Resp 16  Oxygen Therapy  SpO2 98 %  O2 Device Nasal Cannula  Pain Assessment  Pain Scale 0-10  Pain Score 0  MEWS Score  MEWS Temp 0  MEWS Systolic 0  MEWS Pulse 0  MEWS RR 0  MEWS LOC 0  MEWS Score 0  MEWS Score Color Green   Current CBG is 41, patient is asymptomatic (denies dizziness or visual changes, no tremors, no sweating) . Patient eating dinner tray currently (pork chops, green beans, mashed potatoes, side salad, fruit cup). Crackers and peanut butter given with apple juice and chocolate ensure. Patient ate 100 % of meal and requesting more food. IV team paged for IV team consult as Right AC PIV is occluded.  ADDENDUM: Upon rechecking CBG is 61.

## 2019-12-17 NOTE — ED Notes (Signed)
Pt provided with more OJ, crackers, and apple sauce for BG 62. Dr. Wyline Copas paged

## 2019-12-17 NOTE — ED Notes (Signed)
Pt BG 68 but asymptomatic. Pt provided with crackers, cheese, and OJ. Will reassess

## 2019-12-17 NOTE — ED Notes (Signed)
GAVE PATIENT SOME ORANGE JUICE PATIENT IS RESTING WITH CALL BELL IN REACH

## 2019-12-18 ENCOUNTER — Encounter (HOSPITAL_COMMUNITY): Payer: Self-pay | Admitting: Internal Medicine

## 2019-12-18 ENCOUNTER — Inpatient Hospital Stay (HOSPITAL_COMMUNITY): Payer: Medicare Other

## 2019-12-18 DIAGNOSIS — I160 Hypertensive urgency: Secondary | ICD-10-CM

## 2019-12-18 LAB — BASIC METABOLIC PANEL
Anion gap: 8 (ref 5–15)
BUN: 27 mg/dL — ABNORMAL HIGH (ref 6–20)
CO2: 28 mmol/L (ref 22–32)
Calcium: 8.3 mg/dL — ABNORMAL LOW (ref 8.9–10.3)
Chloride: 104 mmol/L (ref 98–111)
Creatinine, Ser: 2.06 mg/dL — ABNORMAL HIGH (ref 0.44–1.00)
GFR calc Af Amer: 33 mL/min — ABNORMAL LOW (ref >60)
GFR calc non Af Amer: 28 mL/min — ABNORMAL LOW (ref >60)
Glucose, Bld: 61 mg/dL — ABNORMAL LOW (ref 70–99)
Potassium: 4.8 mmol/L (ref 3.5–5.1)
Sodium: 140 mmol/L (ref 135–145)

## 2019-12-18 LAB — URINALYSIS, ROUTINE W REFLEX MICROSCOPIC
Bilirubin Urine: NEGATIVE
Glucose, UA: NEGATIVE mg/dL
Hgb urine dipstick: NEGATIVE
Ketones, ur: NEGATIVE mg/dL
Leukocytes,Ua: NEGATIVE
Nitrite: NEGATIVE
Protein, ur: NEGATIVE mg/dL
Specific Gravity, Urine: 1.005 (ref 1.005–1.030)
pH: 7 (ref 5.0–8.0)

## 2019-12-18 LAB — GLUCOSE, CAPILLARY
Glucose-Capillary: 84 mg/dL (ref 70–99)
Glucose-Capillary: 177 mg/dL — ABNORMAL HIGH (ref 70–99)
Glucose-Capillary: 349 mg/dL — ABNORMAL HIGH (ref 70–99)
Glucose-Capillary: 98 mg/dL (ref 70–99)
Glucose-Capillary: 88 mg/dL (ref 70–99)
Glucose-Capillary: 93 mg/dL (ref 70–99)
Glucose-Capillary: 123 mg/dL — ABNORMAL HIGH (ref 70–99)

## 2019-12-18 LAB — ECHOCARDIOGRAM COMPLETE: Height: 59 in

## 2019-12-18 MED ORDER — PRO-STAT SUGAR FREE PO LIQD
30.0000 mL | Freq: Two times a day (BID) | ORAL | Status: DC
  Administered 2019-12-18 – 2019-12-22 (×8): 30 mL via ORAL
  Filled 2019-12-18 (×9): qty 30

## 2019-12-18 MED ORDER — ISOSORBIDE MONONITRATE ER 30 MG PO TB24
30.0000 mg | ORAL_TABLET | Freq: Every day | ORAL | Status: DC
  Administered 2019-12-19: 30 mg via ORAL
  Filled 2019-12-18: qty 1

## 2019-12-18 MED ORDER — HYDRALAZINE HCL 50 MG PO TABS
50.0000 mg | ORAL_TABLET | Freq: Three times a day (TID) | ORAL | Status: DC
  Administered 2019-12-18 – 2019-12-22 (×12): 50 mg via ORAL
  Filled 2019-12-18 (×12): qty 1

## 2019-12-18 MED ORDER — ISOSORBIDE MONONITRATE ER 30 MG PO TB24
15.0000 mg | ORAL_TABLET | Freq: Once | ORAL | Status: AC
  Administered 2019-12-18: 15 mg via ORAL
  Filled 2019-12-18: qty 1

## 2019-12-18 MED ORDER — HYDRALAZINE HCL 25 MG PO TABS
25.0000 mg | ORAL_TABLET | Freq: Once | ORAL | Status: AC
  Administered 2019-12-18: 25 mg via ORAL
  Filled 2019-12-18: qty 1

## 2019-12-18 MED ORDER — DEXTROSE 50 % IV SOLN
INTRAVENOUS | Status: AC
  Administered 2019-12-18: 50 mL
  Filled 2019-12-18: qty 50

## 2019-12-18 MED ORDER — ADULT MULTIVITAMIN W/MINERALS CH
1.0000 | ORAL_TABLET | Freq: Every day | ORAL | Status: DC
  Administered 2019-12-18 – 2019-12-22 (×5): 1 via ORAL
  Filled 2019-12-18 (×4): qty 1

## 2019-12-18 MED ORDER — ISOSORBIDE MONONITRATE ER 30 MG PO TB24
15.0000 mg | ORAL_TABLET | Freq: Every day | ORAL | Status: DC
  Administered 2019-12-18: 15 mg via ORAL
  Filled 2019-12-18: qty 1

## 2019-12-18 NOTE — Progress Notes (Signed)
PROGRESS NOTE    Joanna [name]  OIT:[id number] DOB: 07/25/1970 DOA: 12/16/2019 PCP: Marliss Coots, NP    Brief Narrative:  49 y.o. female with medical history significant for diabetes, hypertension, morbid obesity and intellectual disability/illiteracy who presents to the emergency room from urgent care with hypoxia with O2 sats 88% and concern for congestive heart failure.  Patient initially presented to [facility] emergency room gloomy in the day but left without being seen and went to the urgent care.  History is taken from patient, but also from her niece and caregiver and her caregivers mother.  Patient is intellectually disabled and is unable to read and is cared for by her sister.  Patient has been having lower extremity edema for several months and over the past 2 weeks she started having swelling in the lower abdomen.  Neither patient or family noticed shortness of breath but when questioned further, patient states she sometimes gets out of breath when she is walking in the home.  She mentions that she got bitten by an insect a few days ago on her lower abdomen and her aunt said she had been treating it with Benadryl and topical hydrocortisone.  She has had no fever or chills.  Reported no cough.  Patient's blood pressure was noted to be high in urgent care and she did say that her blood pressure is usually up when she goes to the doctor.  The aunt said they spoke to the doctor about the swelling that has been ongoing for months but no fluid pill have been prescribed.  She denies nausea or vomiting, abdominal pain or change in bowel habits.  Denies dysuria.  History is limited due to patient being mentally challenged.   ED Course: Patient arrived to the ER on O2 at 2 L with sats of 95%.  Blood pressure 190/95 heart rate 103.  She was afebrile.  Blood work revealed BNP of 1324, creatinine of 1.79.  Normal liver function.  WBC 6.4, hemoglobin 10.3.  Troponin not done in the ER.  EKG showed sinus rhythm at a  rate of 98 with no acute ST-T wave changes.  Chest x-ray showed mild cardiomegaly, prominence of the interstitial lung markings reflecting either atelectasis and interstitial edema or atypical/viral pneumonia.  Patient was given a dose of Lasix.  Hospitalist consulted for admission.  Assessment & Plan:   Principal Problem:   New onset of congestive heart failure (HCC) Active Problems:   HTN (hypertension)   Type 2 diabetes mellitus without complication (HCC)   Anasarca   AKI (acute kidney injury) ([place])   Obesity, Class III, BMI 40-49.9 (morbid obesity) ([place])   Depression   Intellectual disability   Acute diastolic heart failure (HCC)   Acute respiratory failure with hypoxia (HCC)   Hypertensive urgency   New onset of diastolic congestive heart failure with acute exacerbation ([place])   Anasarca -Patient presents with 4+ edema, interstitial edema on chest x-ray BNP over 1300 -Will continue with hydralazine for now for bp management.  No ACE/ARB due to AKI.  No beta-blocker due to acute heart failure,  -Patient is continued with lasix 17m IV bid with very good output today -Echocardiogram performed, normal LVEF -Given rising Cr to over 2 and new hx of heart failure, have consulted Cardiology for assistance, appreciate input -Repeat bmet in AM  Acute respiratory failure with [place] -O2 sat 88% on room air with EMS at time of presentation -Secondary to above -now on minimal O2 support  Hypertensive  urgency -BP 190/95 at presentation -continued with Hydralazine 25 mg 3 times daily, now increased to 58m q8h per Cardiology -Holding home amlodipine due to anasarca.  Holding home HCTZ -bp remains poorly controlled. Have added Imdur initially at 15mdaily with dose increased to 3063mer Cardiology    Type 2 diabetes mellitus without complication (HCC) -Sliding scale insulin coverage pending med rec.  Holding oral glimepiride and Metformin while in hospital -Hypoglycemic  recently, continued on gentle d10 IVF. Glucose thus far stable    AKI (acute kidney injury) ([place] 1.79 at presentation -Cr has trended up to over 2 -Ordered and reviewed renal US.[place] evidence of obstructive disease, however L kidney not visualized -Repeat bmet in AM    Obesity, Class III, BMI 40-49.9 (morbid obesity) ([place] complicates overall prognosis and care -Recommend diet/lifestyle modification    Intellectual disability with illiteracy -Patient aunt that she is intellectually delayed and unable to read -Increase nursing assistance with written communication  DVT prophylaxis: Lovenox subq Code Status: Full Family Communication: Pt in room, family not at bedside  Status is: Inpatient  Remains inpatient appropriate because:IV treatments appropriate due to intensity of illness or inability to take PO   Dispo: The patient is from: Home              Anticipated d/c is to: Home              Anticipated d/c date is: > 3 days              Patient currently is not medically stable to d/c.  Consultants:   Cardiology  Procedures:     Antimicrobials: Anti-infectives (From admission, onward)   None      Subjective: States feeling better and breathing better  Objective: Vitals:   12/18/19 1005 12/18/19 1123 12/18/19 1422 12/18/19 1630  BP: (!) 151/93 (!) 168/100 (!) 170/97 (!) 166/92  Pulse: 90 84 94 88  Resp: 16 20  20   Temp:  97.8 F (36.6 C)  97.9 F (36.6 C)  TempSrc:  Oral  Oral  SpO2: 100% 100%  100%  Weight:      Height:        Intake/Output Summary (Last 24 hours) at 12/18/2019 1732 Last data filed at 12/18/2019 1541 Gross per 24 hour  Intake 699.44 ml  Output 4450 ml  Net -3750.56 ml   Filed Weights   12/17/19 1759 12/17/19 1813 12/18/19 0357  Weight: (!) 146 kg (!) 144.5 kg (!) 143.3 kg    Examination: General exam: Awake, laying in bed, in nad Respiratory system: Normal respiratory effort, no wheezing Cardiovascular  system: regular rate, s1, s2 Gastrointestinal system: Soft, nondistended, positive BS Central nervous system: CN2-12 grossly intact, strength intact Extremities: Perfused, no clubbing Skin: Normal skin turgor, no notable skin lesions seen Psychiatry: Mood normal // no visual hallucinations   Data Reviewed: I have personally reviewed following labs and imaging studies  CBC: Recent Labs  Lab 12/16/19 1339 12/17/19 0758  WBC 6.4 6.8  HGB 10.3* 10.1*  HCT 34.4* 34.7*  MCV 86.9 87.8  PLT 355 [id number] Metabolic Panel: Recent Labs  Lab 12/16/19 1339 12/17/19 0758 12/18/19 0441  NA 139 140 140  K 4.2 4.9 4.8  CL 108 108 104  CO2 22 22 28   GLUCOSE 169* 83 61*  BUN 20 24* 27*  CREATININE 1.79* 1.86* 2.06*  CALCIUM 8.4* 8.4* 8.3*   GFR: Estimated Creatinine Clearance: 44.8 mL/min (A) (by C-G  formula based on SCr of 2.06 mg/dL (H)). Liver Function Tests: Recent Labs  Lab 12/16/19 2006  AST 22  ALT 22  ALKPHOS 43  BILITOT 0.6  PROT 5.8*  ALBUMIN 2.4*   No results for input(s): LIPASE, AMYLASE in the last 168 hours. No results for input(s): AMMONIA in the last 168 hours. Coagulation Profile: No results for input(s): INR, PROTIME in the last 168 hours. Cardiac Enzymes: No results for input(s): CKTOTAL, CKMB, CKMBINDEX, TROPONINI in the last 168 hours. BNP (last 3 results) No results for input(s): PROBNP in the last 8760 hours. HbA1C: Recent Labs    12/17/19 0758  HGBA1C 7.4*   CBG: Recent Labs  Lab 12/18/19 0609 12/18/19 0611 12/18/19 0806 12/18/19 1124 12/18/19 1618  GLUCAP 349* 177* 98 88 93   Lipid Profile: No results for input(s): CHOL, HDL, LDLCALC, TRIG, CHOLHDL, LDLDIRECT in the last 72 hours. Thyroid Function Tests: Recent Labs    12/17/19 0758  TSH 2.496   Anemia Panel: No results for input(s): VITAMINB12, FOLATE, FERRITIN, TIBC, IRON, RETICCTPCT in the last 72 hours. Sepsis Labs: No results for input(s): PROCALCITON, LATICACIDVEN in the  last 168 hours.  Recent Results (from the past 240 hour(s))  SARS Coronavirus 2 by RT PCR (hospital order, performed in [facility] hospital lab) Nasopharyngeal Nasopharyngeal Swab     Status: None   Collection Time: 12/16/19  9:32 PM   Specimen: Nasopharyngeal Swab  Result Value Ref Range Status   SARS Coronavirus 2 NEGATIVE NEGATIVE Final    Comment: (NOTE) SARS-CoV-2 target nucleic acids are NOT DETECTED.  The SARS-CoV-2 RNA is generally detectable in upper and lower respiratory specimens during the acute phase of infection. The lowest concentration of SARS-CoV-2 viral copies this assay can detect is 250 copies / mL. A negative result does not preclude SARS-CoV-2 infection and should not be used as the sole basis for treatment or other patient management decisions.  A negative result may occur with improper specimen collection / handling, submission of specimen other than nasopharyngeal swab, presence of viral mutation(s) within the areas targeted by this assay, and inadequate number of viral copies (<250 copies / mL). A negative result must be combined with clinical observations, patient history, and epidemiological information.  Fact Sheet for Patients:   StrictlyIdeas.no  Fact Sheet for Healthcare Providers: BankingDealers.co.za  This test is not yet approved or  cleared by the [place] FDA and has been authorized for detection and/or diagnosis of SARS-CoV-2 by FDA under an Emergency Use Authorization (EUA).  This EUA will remain in effect (meaning this test can be used) for the duration of the COVID-19 declaration under Section 564(b)(1) of the Act, 21 U.S.C. section 360bbb-3(b)(1), unless the authorization is terminated or revoked sooner.  Performed at [facility], [place] [address] [place], [place] [address]      Radiology Studies: US RENAL  Result Date: 12/18/2019 CLINICAL DATA:  Acute renal failure.   Hypertension and diabetes. EXAM: RENAL / URINARY TRACT ULTRASOUND COMPLETE COMPARISON:  None. FINDINGS: Right Kidney: Renal measurements: 9.2 x 4.5 x 5.0 cm = volume: 107 mL. Increased echogenicity of the renal parenchymal tissue. No hydronephrosis. No focal lesion. Left Kidney: Not found by sonography. Bladder: Appears normal for degree of bladder distention. Other: None. IMPRESSION: Right kidney slightly small and echogenic but without obstruction. Left kidney could not be found by the sonographer. Urine present in the bladder. Electronically Signed   By: Nelson Chimes M.D.   On: 12/18/2019 11:32  ECHOCARDIOGRAM COMPLETE  Result Date: 12/18/2019    ECHOCARDIOGRAM REPORT   Patient Name:   Joanna [name]  Date of Exam: 12/17/2019 Medical Rec #:  [id number]  Height:       59.0 in Accession #:    [id number] Weight:       318.6 lb Date of Birth:  [birth year]  BSA:          2.248 m Patient Age:    43 years   BP:           135/78 mmHg Patient Gender: F          HR:           90 bpm. Exam Location:  Inpatient Procedure: 2D Echo, Cardiac Doppler and Color Doppler Indications:    I50.33 Acute on chronic diastolic (congestive) heart failure  History:        Patient has no prior history of Echocardiogram examinations.                 Risk Factors:Hypertension and Diabetes.  Sonographer:    Jonelle Sidle Dance Referring Phys: [id number] Athena Masse  Sonographer Comments: Patient is morbidly obese. IMPRESSIONS  1. Left ventricular ejection fraction, by estimation, is 60 to 65%. The left ventricle has normal function. The left ventricle has no regional wall motion abnormalities. There is moderate left ventricular hypertrophy. Left ventricular diastolic parameters are indeterminate.  2. Right ventricular systolic function is normal. The right ventricular size is normal. There is mildly elevated pulmonary artery systolic pressure. The estimated right ventricular systolic pressure is 14.9 mmHg.  3. Left atrial size was mild to moderately  dilated.  4. Right atrial size was mildly dilated.  5. Small pericardial effusion. The pericardial effusion is circumferential. Mild septal shudder and dilated IVC. No respiratory variation in mitral inflow, no RV diastolic collapse. No tamponade physiology noted.  6. The mitral valve is normal in structure. Trivial mitral valve regurgitation. No evidence of mitral stenosis.  7. The aortic valve is grossly normal. Aortic valve regurgitation is trivial. No aortic stenosis is present.  8. The inferior vena cava is dilated in size with <50% respiratory variability, suggesting right atrial pressure of 15 mmHg. FINDINGS  Left Ventricle: Left ventricular ejection fraction, by estimation, is 60 to 65%. The left ventricle has normal function. The left ventricle has no regional wall motion abnormalities. The left ventricular internal cavity size was normal in size. There is  moderate left ventricular hypertrophy. Left ventricular diastolic parameters are indeterminate. Right Ventricle: The right ventricular size is normal. No increase in right ventricular wall thickness. Right ventricular systolic function is normal. There is mildly elevated pulmonary artery systolic pressure. The tricuspid regurgitant velocity is 2.67  m/s, and with an assumed right atrial pressure of 15 mmHg, the estimated right ventricular systolic pressure is 70.2 mmHg. Left Atrium: Left atrial size was mild to moderately dilated. Right Atrium: Right atrial size was mildly dilated. Pericardium: Small pericardial effusion. A small pericardial effusion is present. The pericardial effusion is circumferential. Mitral Valve: The mitral valve is normal in structure. Normal mobility of the mitral valve leaflets. Trivial mitral valve regurgitation. No evidence of mitral valve stenosis. Tricuspid Valve: The tricuspid valve is normal in structure. Tricuspid valve regurgitation is trivial. No evidence of tricuspid stenosis. Aortic Valve: The aortic valve is grossly  normal. Aortic valve regurgitation is trivial. No aortic stenosis is present. Pulmonic Valve: The pulmonic valve was not well visualized. Pulmonic valve regurgitation is mild. No evidence of pulmonic  stenosis. Aorta: The aortic root is normal in size and structure. Venous: The inferior vena cava is dilated in size with less than 50% respiratory variability, suggesting right atrial pressure of 15 mmHg. IAS/Shunts: The interatrial septum was not well visualized.  LEFT VENTRICLE PLAX 2D LVIDd:         4.92 cm  Diastology LVIDs:         3.40 cm  LV e' lateral:   6.20 cm/s LV PW:         1.56 cm  LV E/e' lateral: 17.7 LV IVS:        1.40 cm  LV e' medial:    6.42 cm/s LVOT diam:     2.00 cm  LV E/e' medial:  17.1 LV SV:         60 LV SV Index:   27 LVOT Area:     3.14 cm  RIGHT VENTRICLE RV Basal diam:  3.06 cm RV Mid diam:    2.65 cm RV S prime:     14.80 cm/s TAPSE (M-mode): 2.1 cm LEFT ATRIUM              Index       RIGHT ATRIUM           Index LA diam:        5.40 cm  2.40 cm/m  RA Area:     21.10 cm LA Vol (A2C):   101.0 ml 44.93 ml/m RA Volume:   60.00 ml  26.69 ml/m LA Vol (A4C):   87.7 ml  39.02 ml/m LA Biplane Vol: 96.0 ml  42.71 ml/m  AORTIC VALVE LVOT Vmax:   103.00 cm/s LVOT Vmean:  66.900 cm/s LVOT VTI:    0.190 m  AORTA Ao Root diam: 3.10 cm Ao Asc diam:  3.30 cm MITRAL VALVE                TRICUSPID VALVE MV Area (PHT): 2.83 cm     TR Peak grad:   28.5 mmHg MV Decel Time: 268 msec     TR Vmax:        267.00 cm/s MV E velocity: 109.50 cm/s                             SHUNTS                             Systemic VTI:  0.19 m                             Systemic Diam: 2.00 cm Cherlynn Kaiser MD Electronically signed by Cherlynn Kaiser MD Signature Date/Time: 12/18/2019/12:36:02 AM    Final     Scheduled Meds: . aspirin EC  81 mg Oral Daily  . enoxaparin (LOVENOX) injection  40 mg Subcutaneous Daily  . feeding supplement (PRO-STAT SUGAR FREE 64)  30 mL Oral BID  . furosemide  60 mg Intravenous  Q12H  . hydrALAZINE  50 mg Oral Q8H  . insulin aspart  0-5 Units Subcutaneous QHS  . insulin aspart  0-9 Units Subcutaneous TID WC  . isosorbide mononitrate  15 mg Oral Once  . [START ON 12/19/2019] isosorbide mononitrate  30 mg Oral Daily  . multivitamin with minerals  1 tablet Oral Daily  . sodium chloride flush  3 mL Intravenous Q12H   Continuous  Infusions: . sodium chloride    . dextrose 20 mL/hr at 12/18/19 0100     LOS: 2 days   Marylu Lund, MD [facility] Pager On Amion  If 7PM-7AM, please contact night-coverage 12/18/2019, 5:32 PM

## 2019-12-18 NOTE — Progress Notes (Signed)
Initial Nutrition Assessment  DOCUMENTATION CODES:   Morbid obesity  INTERVENTION:  - will order 30 ml prostat BID, each supplement provides 100 kcal and 15 grams protein. - will order 1 tablet multivitamin with minerals/day. - will place diet education handout in discharge instructions.  NUTRITION DIAGNOSIS:   Food and nutrition related knowledge deficit related to other (see comment) (new dx of CHF) as evidenced by other (comment) (need for diet education).  GOAL:   Patient will meet greater than or equal to 90% of their needs  MONITOR:   PO intake, Supplement acceptance, Labs, Weight trends  REASON FOR ASSESSMENT:   Consult Assessment of nutrition requirement/status, Diet education  ASSESSMENT:   49 y.o. female with medical history of DM, HTN, morbid obesity, and intellectual disability/illiteracy. She presented to the ED from Urgent Care with hypoxia and concern for CHF. She is unable to read and is cared for by her sister and another caregiver.  RD is working on another campus and was unable to reach patient by phone. Notes indicate that several people help care for patient. Patient is unable to read but caregivers are able so will place handout in Discharge Instruction.   Patient ate 100% of breakfast and 100% of lunch today. No PTA weight information is available. Yesterday she weighed 318 lb and today she weighs 316 lb.   Cardiology is following. Patient with anasarca and new dx of CHF and noted renal insufficiency. She was also admitted with hypertensive urgency.    Labs reviewed; CBGs: 84, 349, 177, 98, 88 mg/dl, BUN: 27 mg/dl, creatinine: 2.06 mg/dl, Ca: 8.3 mg/dl, GFR: 28 ml/min. Medications reviewed; 60 mg IV lasix BID, sliding scale novolog. IVF; D10 @ 20 ml/hr (163 kcal).      NUTRITION - FOCUSED PHYSICAL EXAM:  unable to complete as RD is working on another campus.  Diet Order:   Diet Order            Diet heart healthy/carb modified Room service  appropriate? Yes; Fluid consistency: Thin; Fluid restriction: 1800 mL Fluid  Diet effective now                 EDUCATION NEEDS:   Education needs have been addressed  Skin:  Skin Assessment: Reviewed RN Assessment  Last BM:  PTA/unknown  Height:   Ht Readings from Last 1 Encounters:  12/17/19 4' 11"  (1.499 m)    Weight:   Wt Readings from Last 1 Encounters:  12/18/19 (!) 143.3 kg     Estimated Nutritional Needs:  Kcal:  2000-2200 kcal Protein:  105-115 grams Fluid:  >/= 1.3 L/day     Jarome Matin, MS, RD, LDN, CNSC Inpatient Clinical Dietitian RD pager # available in AMION  After hours/weekend pager # available in [facility]

## 2019-12-18 NOTE — Consult Note (Addendum)
Cardiology Consultation:   Patient ID: Joanna [name] MRN: [id number]; DOB: 1971/03/01  Admit date: 12/16/2019 Date of Consult: 12/18/2019  Primary Care Provider: Marliss Coots, NP [facility] Cardiologist: New to [facility] Electrophysiologist:  None    Patient Profile:   Joanna [name] is a 49 y.o. female with a hx of DM, HTN, severe morbid obesity, intellectual disability/illiteracy who is being seen today for the evaluation of CHF at the request of Dr. Wyline Copas.  History of Present Illness:   The patient is a poor historian at times, so history is supplemented by admission documentation. She perseverates on the comment that her sister urged her to come up to the hospital many times before she was finally willing to. Per notes, she presented to the hospital 12/16/2019 with several month history of progressively worsening lower extremity edema involving her abdomen. She reports it was especially bad over the last week. She initially said something had bit her on her stomach so they were using Benadryl/hydrocortisone to treat this. She also appears to have impressive lower extremity edema on exam. She initially denied SOB but later said she had difficulty breathing at times, requiring her to stand up against a wall. She denies any chest pain. Upon arrival to the ED, she was found to have hypertensive urgency, acute respiratory failure with hypoxia (pulse ox of 88%) , and renal insufficiency with Cr of 1.79 (of unknown chronicity). Labwork otherwise reveals Hgb of 10.3, albumin 2.4, BNP 1325, CRP 4.6, hsTroponin of 31, TSH wnl, A1C 7.4 although hypoglycemic at times, Covid negative. CXR showed shallow inspiration with prominence of interstitial lung markings and mild cardiomegaly. Renal US showed slightly small right kidney without obstruction, left kidney could not be located. Attempted to call contact on chart but no answer. 2D Echo 7/1 showed EF 60-65%, moderate LVH, mildly elevated PASP  43.48mHg, mild-moderately dilated LA, mildly dilated RA, small circumferential pericardial effusion without tamponade physiology, dilated IVC.  She received 285mIV Lasix on arrival and has been started on 6057mV BID with further rise in Cr to 2.06, - 1.5L, weight 321.8->315.9 today. She reports symptomatic improvement but still appears grossly volume overloaded.    Past Medical History:  Diagnosis Date  . [place] mellitus without complication ([place] . Hypertension   . [place]bid [place] ([place]   History reviewed. No pertinent surgical history.   Home Medications:  Prior to Admission medications   Medication Sig Start Date End Date Taking? Authorizing Provider  amLODipine (NORVASC) 10 MG tablet Take 10 mg by mouth daily.   Yes [provider]  glimepiride (AMARYL) 2 MG tablet Take 2 mg by mouth daily with breakfast.   Yes [provider]  hydrochlorothiazide (HYDRODIURIL) 25 MG tablet Take 25 mg by mouth daily.   Yes [provider]  metformin (FORTAMET) 500 MG (OSM) 24 hr tablet Take 500 mg by mouth daily with breakfast.   Yes [provider]    Inpatient Medications: Scheduled Meds: . aspirin EC  81 mg Oral Daily  . enoxaparin (LOVENOX) injection  40 mg Subcutaneous Daily  . furosemide  60 mg Intravenous Q12H  . hydrALAZINE  25 mg Oral Q8H  . insulin aspart  0-5 Units Subcutaneous QHS  . insulin aspart  0-9 Units Subcutaneous TID WC  . isosorbide mononitrate  15 mg Oral Daily  . sodium chloride flush  3 mL Intravenous Q12H   Continuous Infusions: . sodium chloride    . dextrose 20  mL/hr at 12/18/19 0100   PRN Meds: sodium chloride, acetaminophen, ondansetron (ZOFRAN) IV, sodium chloride flush  Allergies:   No Known Allergies  Social History:   Social History   Socioeconomic History  . Marital status: Single    Spouse name: Not on file  . Number of children: Not on file  . Years of education: Not on file  . Highest education  level: Not on file  Occupational History  . Not on file  Tobacco Use  . Smoking status: Never Smoker  . Smokeless tobacco: Never Used  Substance and Sexual Activity  . Alcohol use: Not Currently  . Drug use: Not Currently  . Sexual activity: Not on file  Other Topics Concern  . Not on file  Social History Narrative  . Not on file   Social Determinants of Health   Financial Resource Strain:   . Difficulty of Paying Living Expenses:   Food Insecurity:   . Worried About Charity fundraiser in the Last Year:   . Arboriculturist in the Last Year:   Transportation Needs:   . Film/video editor (Medical):   Marland Kitchen Lack of Transportation (Non-Medical):   Physical Activity:   . Days of Exercise per Week:   . Minutes of Exercise per Session:   Stress:   . Feeling of Stress :   Social Connections:   . Frequency of Communication with Friends and Family:   . Frequency of Social Gatherings with Friends and Family:   . Attends Religious Services:   . Active Member of Clubs or Organizations:   . Attends Archivist Meetings:   Marland Kitchen Marital Status:   Intimate Partner Violence:   . Fear of Current or Ex-Partner:   . Emotionally Abused:   Marland Kitchen Physically Abused:   . Sexually Abused:     Family History:   Unknown to patient due to intellectual disability   ROS:  Please see the history of present illness.  All other ROS reviewed and negative.     Physical Exam/Data:   Vitals:   12/18/19 0357 12/18/19 0810 12/18/19 1005 12/18/19 1123  BP: (!) 179/111 (!) 157/88 (!) 151/93 (!) 168/100  Pulse: 93 91 90 84  Resp: 19 (!) 23 16 20   Temp: (!) 97.3 F (36.3 C) 98.5 F (36.9 C)  97.8 F (36.6 C)  TempSrc: Oral Oral  Oral  SpO2: 100% 100% 100% 100%  Weight: (!) 143.3 kg     Height:        Intake/Output Summary (Last 24 hours) at 12/18/2019 1346 Last data filed at 12/18/2019 1315 Gross per 24 hour  Intake 699.44 ml  Output 3450 ml  Net -2750.56 ml   Last 3 Weights 12/18/2019  12/17/2019 12/17/2019  Weight (lbs) 315 lb 14.7 oz 318 lb 9 oz 321 lb 14 oz  Weight (kg) 143.3 kg 144.5 kg 146 kg     Body mass index is 63.81 kg/m.  General:  Morbidly obese AAF in no acute distress. Head: Normocephalic, atraumatic, sclera non-icteric, no xanthomas, nares are without discharge. Neck: Negative for carotid bruits. JVP not elevated. Lungs: Diffusely diminished bilaterally to auscultation without wheezes, rales, or rhonchi. Breathing is unlabored. Heart: RRR S1 S2 without murmurs, rubs, or gallops.  Abdomen: Distended, nontender with diffuse striae. No rebound/guarding. Extremities: No clubbing or cyanosis. Distal pulses difficult to assess given severity of edema. Severe edema bilateral lower extremities up to thighs and sacrum/abdomen Neuro: Alert and oriented X 3 although poor  historian at times, requiring repeat of question, perseveration/stutter noted at times, moves all extremities spontaneously and follows commands Psych:  Pleasant affect.  EKG:  The EKG was personally reviewed and demonstrates:  12/16/19 NSR 98bpm, low voltage QRS, cannot rule out prior anterior infarct,  nonspecific TW changes, no prior to compare to  Telemetry:  Telemetry was personally reviewed and demonstrates: NSR with 1 episode of Wenckebach  Relevant CV Studies: 2D Echo 12/18/19  1. Left ventricular ejection fraction, by estimation, is 60 to 65%. The  left ventricle has normal function. The left ventricle has no regional  wall motion abnormalities. There is moderate left ventricular hypertrophy.  Left ventricular diastolic  parameters are indeterminate.  2. Right ventricular systolic function is normal. The right ventricular  size is normal. There is mildly elevated pulmonary artery systolic  pressure. The estimated right ventricular systolic pressure is 85.0 mmHg.  3. Left atrial size was mild to moderately dilated.  4. Right atrial size was mildly dilated.  5. Small pericardial effusion.  The pericardial effusion is  circumferential. Mild septal shudder and dilated IVC. No respiratory  variation in mitral inflow, no RV diastolic collapse. No tamponade  physiology noted.  6. The mitral valve is normal in structure. Trivial mitral valve  regurgitation. No evidence of mitral stenosis.  7. The aortic valve is grossly normal. Aortic valve regurgitation is  trivial. No aortic stenosis is present.  8. The inferior vena cava is dilated in size with <50% respiratory  variability, suggesting right atrial pressure of 15 mmHg.   Laboratory Data:  High Sensitivity Troponin:   Recent Labs  Lab 12/17/19 0758  TROPONINIHS 31*     Chemistry Recent Labs  Lab 12/16/19 1339 12/17/19 0758 12/18/19 0441  NA 139 140 140  K 4.2 4.9 4.8  CL 108 108 104  CO2 22 22 28   GLUCOSE 169* 83 61*  BUN 20 24* 27*  CREATININE 1.79* 1.86* 2.06*  CALCIUM 8.4* 8.4* 8.3*  GFRNONAA 33* 32* 28*  GFRAA 39* 37* 33*  ANIONGAP 9 10 8     Recent Labs  Lab 12/16/19 2006  PROT 5.8*  ALBUMIN 2.4*  AST 22  ALT 22  ALKPHOS 43  BILITOT 0.6   Hematology Recent Labs  Lab 12/16/19 1339 12/17/19 0758  WBC 6.4 6.8  RBC 3.96 3.95  HGB 10.3* 10.1*  HCT 34.4* 34.7*  MCV 86.9 87.8  MCH 26.0 25.6*  MCHC 29.9* 29.1*  RDW 17.8* 17.8*  PLT 355 337   BNP Recent Labs  Lab 12/16/19 1339  BNP 1,325.4*    DDimer No results for input(s): DDIMER in the last 168 hours.   Radiology/Studies:  DG Chest 2 View  Result Date: 12/16/2019 CLINICAL DATA:  Provided history: Swelling. Additional history provided: Patient reports abdominal swelling and redness; concern for bug bite, bilateral leg swelling and shortness of breath. EXAM: CHEST - 2 VIEW COMPARISON:  No pertinent prior studies available for comparison. FINDINGS: A metallic clothes pin projects in the region of the left hemithorax. Shallow inspiration radiograph with accentuation of heart size. There is apparent mild cardiomegaly. There is  prominence of the interstitial lung markings within the mid to lower lung fields. No definite pleural effusion. No evidence of pneumothorax. No acute bony abnormality identified. IMPRESSION: Shallow inspiration radiograph. Prominence of the interstitial lung markings within the mid to lower lung field, which may reflect atelectasis, interstitial edema or atypical/viral pneumonia. Mild cardiomegaly. A metallic clothes pin projects in the region of the left hemithorax.  This is presumed external to the patient. However, clinical correlation is necessary. Electronically Signed   By: Kellie Simmering DO   On: 12/16/2019 14:07   US RENAL  Result Date: 12/18/2019 CLINICAL DATA:  Acute renal failure.  Hypertension and diabetes. EXAM: RENAL / URINARY TRACT ULTRASOUND COMPLETE COMPARISON:  None. FINDINGS: Right Kidney: Renal measurements: 9.2 x 4.5 x 5.0 cm = volume: 107 mL. Increased echogenicity of the renal parenchymal tissue. No hydronephrosis. No focal lesion. Left Kidney: Not found by sonography. Bladder: Appears normal for degree of bladder distention. Other: None. IMPRESSION: Right kidney slightly small and echogenic but without obstruction. Left kidney could not be found by the sonographer. Urine present in the bladder. Electronically Signed   By: Nelson Chimes M.D.   On: 12/18/2019 11:32   ECHOCARDIOGRAM COMPLETE  Result Date: 12/18/2019    ECHOCARDIOGRAM REPORT   Patient Name:   Joanna [name]  Date of Exam: 12/17/2019 Medical Rec #:  [id number]  Height:       59.0 in Accession #:    [id number] Weight:       318.6 lb Date of Birth:  Apr 02, 1971  BSA:          2.248 m Patient Age:    13 years   BP:           135/78 mmHg Patient Gender: F          HR:           90 bpm. Exam Location:  Inpatient Procedure: 2D Echo, Cardiac Doppler and Color Doppler Indications:    I50.33 Acute on chronic diastolic (congestive) heart failure  History:        Patient has no prior history of Echocardiogram examinations.                 Risk  Factors:Hypertension and Diabetes.  Sonographer:    Jonelle Sidle Dance Referring Phys: [id number] Athena Masse  Sonographer Comments: Patient is morbidly obese. IMPRESSIONS  1. Left ventricular ejection fraction, by estimation, is 60 to 65%. The left ventricle has normal function. The left ventricle has no regional wall motion abnormalities. There is moderate left ventricular hypertrophy. Left ventricular diastolic parameters are indeterminate.  2. Right ventricular systolic function is normal. The right ventricular size is normal. There is mildly elevated pulmonary artery systolic pressure. The estimated right ventricular systolic pressure is 32.9 mmHg.  3. Left atrial size was mild to moderately dilated.  4. Right atrial size was mildly dilated.  5. Small pericardial effusion. The pericardial effusion is circumferential. Mild septal shudder and dilated IVC. No respiratory variation in mitral inflow, no RV diastolic collapse. No tamponade physiology noted.  6. The mitral valve is normal in structure. Trivial mitral valve regurgitation. No evidence of mitral stenosis.  7. The aortic valve is grossly normal. Aortic valve regurgitation is trivial. No aortic stenosis is present.  8. The inferior vena cava is dilated in size with <50% respiratory variability, suggesting right atrial pressure of 15 mmHg. FINDINGS  Left Ventricle: Left ventricular ejection fraction, by estimation, is 60 to 65%. The left ventricle has normal function. The left ventricle has no regional wall motion abnormalities. The left ventricular internal cavity size was normal in size. There is  moderate left ventricular hypertrophy. Left ventricular diastolic parameters are indeterminate. Right Ventricle: The right ventricular size is normal. No increase in right ventricular wall thickness. Right ventricular systolic function is normal. There is mildly elevated pulmonary artery systolic pressure. The tricuspid regurgitant velocity is  2.67  m/s, and with an  assumed right atrial pressure of 15 mmHg, the estimated right ventricular systolic pressure is 65.4 mmHg. Left Atrium: Left atrial size was mild to moderately dilated. Right Atrium: Right atrial size was mildly dilated. Pericardium: Small pericardial effusion. A small pericardial effusion is present. The pericardial effusion is circumferential. Mitral Valve: The mitral valve is normal in structure. Normal mobility of the mitral valve leaflets. Trivial mitral valve regurgitation. No evidence of mitral valve stenosis. Tricuspid Valve: The tricuspid valve is normal in structure. Tricuspid valve regurgitation is trivial. No evidence of tricuspid stenosis. Aortic Valve: The aortic valve is grossly normal. Aortic valve regurgitation is trivial. No aortic stenosis is present. Pulmonic Valve: The pulmonic valve was not well visualized. Pulmonic valve regurgitation is mild. No evidence of pulmonic stenosis. Aorta: The aortic root is normal in size and structure. Venous: The inferior vena cava is dilated in size with less than 50% respiratory variability, suggesting right atrial pressure of 15 mmHg. IAS/Shunts: The interatrial septum was not well visualized.  LEFT VENTRICLE PLAX 2D LVIDd:         4.92 cm  Diastology LVIDs:         3.40 cm  LV e' lateral:   6.20 cm/s LV PW:         1.56 cm  LV E/e' lateral: 17.7 LV IVS:        1.40 cm  LV e' medial:    6.42 cm/s LVOT diam:     2.00 cm  LV E/e' medial:  17.1 LV SV:         60 LV SV Index:   27 LVOT Area:     3.14 cm  RIGHT VENTRICLE RV Basal diam:  3.06 cm RV Mid diam:    2.65 cm RV S prime:     14.80 cm/s TAPSE (M-mode): 2.1 cm LEFT ATRIUM              Index       RIGHT ATRIUM           Index LA diam:        5.40 cm  2.40 cm/m  RA Area:     21.10 cm LA Vol (A2C):   101.0 ml 44.93 ml/m RA Volume:   60.00 ml  26.69 ml/m LA Vol (A4C):   87.7 ml  39.02 ml/m LA Biplane Vol: 96.0 ml  42.71 ml/m  AORTIC VALVE LVOT Vmax:   103.00 cm/s LVOT Vmean:  66.900 cm/s LVOT VTI:    0.190  m  AORTA Ao Root diam: 3.10 cm Ao Asc diam:  3.30 cm MITRAL VALVE                TRICUSPID VALVE MV Area (PHT): 2.83 cm     TR Peak grad:   28.5 mmHg MV Decel Time: 268 msec     TR Vmax:        267.00 cm/s MV E velocity: 109.50 cm/s                             SHUNTS                             Systemic VTI:  0.19 m  Systemic Diam: 2.00 cm Cherlynn Kaiser MD Electronically signed by Cherlynn Kaiser MD Signature Date/Time: 12/18/2019/12:36:02 AM    Final      Assessment and Plan:   1. [place], likely multifactorial from acute diastolic CHF in the context of acute respiratory failure and severe morbid obesity with possible OHS, complicated by renal insufficiency of unknown chronicity - by I/O's and daily weights, appears to be diuresing, although hindered by elevation in renal function. May be component of cardiorenal syndrome - not yet clear where baseline creatinine lies. Albumin noted to be 2.4 although UA without significant proteinuria. Will discuss diuretic regimen with Dr. Debara Pickett. Place fluid restriction on diet. Recommend sleep study as OP as she likely has undiagnosed OSA. She seems to understand the importance of lifestyle modification/weight management as she says her sister stressed the importance - she would benefit from outpatient assistance with this, perhaps referral to medical nutrition program. Will consult inpatient dietitian.  2. Renal insufficiency of unknown chronicity - slight bump in creatinine with diuresis, would continue to follow daily for now. If she develops further worsening will need nephrology to see. Renal US showed small R kidney but interestingly cannot locate L kidney. Could not find prior labwork in [place] or [facility] (which pulls in labs from primary care).  3. Type I second degree AV block - asymptomatic with this, of no specific hemodynamic consequence at this time. Not currently on AVN blocking agents.  4. Hypertensive urgency - home  amlodipine on hold due to swelling and HCTZ due to need for Lasix. Will discuss management in context of #1. Has been started on Imdur/hydralazine combination which likely needs further titration.  5. Diabetes mellitus - per primary team. Has required dextrose infusion for episodic hypoglycemia.  6. Minimally elevated troponin - no recent anginal chest pain. LVEF preserved. Suspect demand process in context of #1.  7. Normocytic anemia - unclear baseline, check anemia panel in AM, monitor for bleeding.  For questions or updates, please contact [place] Please consult [email or web address] for contact info under    Signed, Charlie Pitter, PA-C  12/18/2019 1:46 PM

## 2019-12-18 NOTE — Discharge Instructions (Signed)
Low-Sodium Nutrition Therapy Eating less sodium can help you if you have high blood pressure, heart failure, or kidney or liver disease. Your body needs a little sodium, but too much sodium can cause your body to hold onto extra water.  This extra water will raise your blood pressure and can cause damage to your heart, kidneys, or liver as they are forced to work harder. Sometimes you can see how the extra fluid affects you because your hands, legs, or belly swell.  You may also hold water around your heart and lungs, which makes it hard to breathe. Even if you take medication for blood pressure or a water pill (diuretic) to remove fluid, it is still important to have less salt in your diet. Check with your primary care provider before drinking alcohol since it may affect the amount of fluid in your body and how your heart, kidneys, or liver work.  Sodium in Food A low-sodium meal plan limits the sodium that you get from food and beverages to 1,500-2,000 milligrams (mg) per day.  Salt is the main source of sodium.  Read the nutrition label on the package to find out how much sodium is in one serving of a food. Select foods with 140 milligrams (mg) of sodium or less per serving. You may be able to eat one or two servings of foods with a little more than 140 milligrams (mg) of sodium if you are closely watching how much sodium you eat in a day. Check the serving size on the label.  The amount of sodium listed on the label shows the amount in one serving of the food.  So, if you eat more than one serving, you will get more sodium than the amount listed.  Cutting Back on Sodium Eat more fresh foods. Fresh fruits and vegetables are low in sodium, as well as frozen vegetables and fruits that have no added juices or sauces. Fresh meats are lower in sodium than processed meats, such as bacon, sausage, and hotdogs. Not all processed foods are unhealthy, but some processed foods may have too much  sodium. Eat less salt at the table and when cooking.  One of the ingredients in salt is sodium. One teaspoon of table salt has 2,300 milligrams of sodium. Leave the salt out of recipes for pasta, casseroles, and soups. Be a Paramedic. Food packages that say "Salt-free", sodium-free", "very low sodium," and "low sodium" have less than 140 milligrams of sodium per serving. Beware of products identified as "Unsalted," "No Salt Added," "Reduced Sodium," or "Lower Sodium."  These items may still be high in sodium.  You should always check the nutrition label. Add flavors to your food without adding sodium. Try lemon juice, lime juice, or vinegar. Dry or fresh herbs add flavor. Buy a sodium-free seasoning blend or make your own at home. You can purchase salt-free or sodium-free condiments like barbeque sauce in stores and online.   Eating in Restaurants Choose foods carefully when you eat outside your home.  Restaurant foods can be very high in sodium.  Many restaurants provide nutrition facts on their menus or their websites.  If you cannot find that information, ask your server.  Let your server know that you want your food to be cooked without salt and that you would like your salad dressing and sauces to be served on the side.  Foods Recommended  Grains Bread, bagels, rolls without salted tops Homemade bread made with reduced-sodium baking powder Cold cereals, especially shredded  wheat and puffed rice Oats, grits, or cream of wheat Pastas, quinoa, and rice Popcorn, pretzels or crackers without salt Corn tortillas Protein Foods Fresh meats and fish; [place] bacon (check the nutrition labels - make sure they are not packaged in a sodium solution) Canned or packed tuna (no more than 4 ounces at 1 serving) Beans and peas Soybeans) and tofu Eggs Nuts or nut butters without salt Dairy Milk or milk powder Plant milks, such as rice and soy Yogurt, including Greek yogurt Small  amounts of natural cheese (blocks of cheese) or reduced-sodium cheese can be used in moderation. (Swiss, ricotta, and fresh mozzarella cheese are lower in sodium than the others) Cream cheese Low sodium cottage cheese Vegetables Fresh and frozen vegetables without added sauces or salt Homemade soups (without salt) Low-sodium, salt-free or sodium-free canned vegetables and soups Fruit Fresh and canned fruits Dried fruits, such as raisins, cranberries, and prunes Oils Tub or liquid margarine, regular or without salt Canola, corn, peanut, olive, safflower, or sunflower oils Condiments Fresh or dried herbs such as basil, bay leaf, dill, mustard (dry), nutmeg, paprika, parsley, rosemary, sage, or thyme. Low sodium ketchup Vinegar Lemon or lime juice Pepper, red pepper flakes, and cayenne. Hot sauce contains sodium, but if you use just a drop or two, it will not add up to much. Salt-free or sodium-free seasoning mixes and marinades Simple salad dressings: vinegar and oil  Foods Not Recommended  Grains Breads or crackers topped with salt Cereals (hot/cold) with more than 300 mg sodium per serving Biscuits, cornbread, and other "quick" breads prepared with baking soda Pre-packaged bread crumbs Seasoned and packaged rice and pasta mixes Self-rising flours Protein Foods Cured meats: Bacon, [place], sausage, pepperoni and hot dogs Canned meats (chili, vienna sausage, or sardines) Smoked fish and meats Frozen meals that have more than 600 mg of sodium per serving Egg substitute (with added sodium) Dairy Buttermilk Processed cheese spreads Cottage cheese (1 cup may have over 500 mg of sodium; look for low-sodium.) American or feta cheese Shredded Cheese has more sodium than blocks of cheese String cheese Vegetables Canned vegetables (unless they are salt-free, sodium-free or low sodium) Frozen vegetables with seasoning and sauces Sauerkraut and pickled vegetables Canned or dried  soups (unless they are salt-free, sodium-free, or low sodium) [place] fries and onion rings Fruit  Dried fruits preserved with additives that have sodium Oils  Salted butter or margarine, all types of olives Condiments Salt, sea salt, kosher salt, onion salt, and garlic salt Seasoning mixes with salt Bouillon cubes Ketchup Barbeque sauce and Worcestershire sauce unless low sodium Soy sauce Salsa, pickles, olives, relish Salad dressings: ranch, blue cheese, [place], and [place].    Sodium-Free Flavoring Tips  When cooking, the following items may be used for flavoring instead of salt or seasonings that contain sodium. Remember: A little bit of spice goes a long way! Be careful not to overseason. Spice Blend Recipe (makes about 1/4 cup) 5 teaspoons onion powder 2 teaspoons garlic powder 2 teaspoons paprika 2 teaspoon dry mustard 1 teaspoon crushed thyme leaves  teaspoon white pepper  teaspoon celery seed  Food Item Flavorings Beef  Basil, bay leaf, caraway, curry, dill, dry mustard, garlic, grape jelly, green pepper, mace, marjoram, mushrooms (fresh), nutmeg, onion or onion powder, parsley, pepper, rosemary, sage Chicken  Basil, cloves, cranberries, mace, mushrooms (fresh), nutmeg, oregano, paprika, parsley, pineapple, saffron, sage, savory, tarragon, thyme, tomato, turmeric Egg  Chervil, curry, dill, dry mustard, garlic or garlic powder, green pepper, jelly, mushrooms (fresh),  nutmeg, onion powder, paprika, parsley, rosemary, tarragon, tomato Fish  Basil, bay leaf, chervil, curry, dill, dry mustard, green pepper, lemon juice, marjoram, mushrooms (fresh), paprika, pepper, tarragon, tomato, turmeric Lamb  Cloves, curry, dill, garlic or garlic powder, mace, mint, mint jelly, onion, oregano, parsley, pineapple, rosemary, tarragon, thyme Pork  Applesauce, basil, caraway, chives, cloves, garlic or garlic powder, onion or onion powder, rosemary, thyme Veal  Apricots, basil,  bay leaf, currant jelly, curry, ginger, marjoram, mushrooms (fresh), oregano, paprika Vegetables  Basil, dill, garlic or garlic powder, ginger, lemon juice, mace, marjoram, nutmeg, onion or onion powder, tarragon, tomato, sugar or sugar substitute, salt-free salad dressing, vinegar Desserts  Allspice, anise, cinnamon, cloves, ginger, mace, nutmeg, vanilla extract, other extracts

## 2019-12-18 NOTE — Progress Notes (Signed)
Patient returned to unit. No complaints at this time.

## 2019-12-18 NOTE — Progress Notes (Signed)
Patient off of unit to ultrasound.

## 2019-12-19 DIAGNOSIS — N179 Acute kidney failure, unspecified: Secondary | ICD-10-CM

## 2019-12-19 LAB — FOLATE: Folate: 8.6 ng/mL (ref >5.9)

## 2019-12-19 LAB — FERRITIN: Ferritin: 51 ng/mL (ref 11–307)

## 2019-12-19 LAB — BASIC METABOLIC PANEL
Anion gap: 7 (ref 5–15)
BUN: 25 mg/dL — ABNORMAL HIGH (ref 6–20)
CO2: 29 mmol/L (ref 22–32)
Calcium: 8.3 mg/dL — ABNORMAL LOW (ref 8.9–10.3)
Chloride: 102 mmol/L (ref 98–111)
Creatinine, Ser: 1.74 mg/dL — ABNORMAL HIGH (ref 0.44–1.00)
GFR calc Af Amer: 40 mL/min — ABNORMAL LOW (ref >60)
GFR calc non Af Amer: 35 mL/min — ABNORMAL LOW (ref >60)
Glucose, Bld: 166 mg/dL — ABNORMAL HIGH (ref 70–99)
Potassium: 4.3 mmol/L (ref 3.5–5.1)
Sodium: 138 mmol/L (ref 135–145)

## 2019-12-19 LAB — RETICULOCYTES
Immature Retic Fract: 28.8 % — ABNORMAL HIGH (ref 2.3–15.9)
RBC.: 3.8 MIL/uL — ABNORMAL LOW (ref 3.87–5.11)
Retic Count, Absolute: 84.7 10*3/uL (ref 19.0–186.0)
Retic Ct Pct: 2.2 % (ref 0.4–3.1)

## 2019-12-19 LAB — IRON AND TIBC
Iron: 32 ug/dL (ref 28–170)
Saturation Ratios: 9 % — ABNORMAL LOW (ref 10.4–31.8)
TIBC: 360 ug/dL (ref 250–450)
UIBC: 328 ug/dL

## 2019-12-19 LAB — GLUCOSE, CAPILLARY
Glucose-Capillary: 125 mg/dL — ABNORMAL HIGH (ref 70–99)
Glucose-Capillary: 136 mg/dL — ABNORMAL HIGH (ref 70–99)
Glucose-Capillary: 137 mg/dL — ABNORMAL HIGH (ref 70–99)
Glucose-Capillary: 145 mg/dL — ABNORMAL HIGH (ref 70–99)
Glucose-Capillary: 153 mg/dL — ABNORMAL HIGH (ref 70–99)
Glucose-Capillary: 164 mg/dL — ABNORMAL HIGH (ref 70–99)

## 2019-12-19 LAB — VITAMIN B12: Vitamin B-12: 357 pg/mL (ref 180–914)

## 2019-12-19 MED ORDER — ISOSORBIDE MONONITRATE ER 60 MG PO TB24
120.0000 mg | ORAL_TABLET | Freq: Every day | ORAL | Status: DC
  Administered 2019-12-20 – 2019-12-22 (×3): 120 mg via ORAL
  Filled 2019-12-19 (×3): qty 2

## 2019-12-19 NOTE — Progress Notes (Signed)
Progress Note  Patient Name: Joanna [name] Date of Encounter: 12/19/2019  Primary Cardiologist:   Pixie Casino, MD   Subjective   No chest pain.  Breathing better but not at baseline.    Inpatient Medications    Scheduled Meds: . aspirin EC  81 mg Oral Daily  . enoxaparin (LOVENOX) injection  40 mg Subcutaneous Daily  . feeding supplement (PRO-STAT SUGAR FREE 64)  30 mL Oral BID  . furosemide  60 mg Intravenous Q12H  . hydrALAZINE  50 mg Oral Q8H  . insulin aspart  0-5 Units Subcutaneous QHS  . insulin aspart  0-9 Units Subcutaneous TID WC  . isosorbide mononitrate  30 mg Oral Daily  . multivitamin with minerals  1 tablet Oral Daily  . sodium chloride flush  3 mL Intravenous Q12H   Continuous Infusions: . sodium chloride    . dextrose 20 mL/hr at 12/18/19 0100   PRN Meds: sodium chloride, acetaminophen, ondansetron (ZOFRAN) IV, sodium chloride flush   Vital Signs    Vitals:   12/18/19 1957 12/19/19 0225 12/19/19 0354 12/19/19 0835  BP: (!) 174/105 (!) 161/88 (!) 184/97 (!) 152/100  Pulse: 95 100 88 97  Resp: 20 19 19    Temp: 97.8 F (36.6 C) 97.7 F (36.5 C) 97.8 F (36.6 C)   TempSrc: Oral Oral Oral   SpO2: 98% 97% 100% 93%  Weight:  (!) 140.2 kg    Height:        Intake/Output Summary (Last 24 hours) at 12/19/2019 1034 Last data filed at 12/19/2019 0900 Gross per 24 hour  Intake 750.04 ml  Output 5475 ml  Net -4724.96 ml   Filed Weights   12/17/19 1813 12/18/19 0357 12/19/19 0225  Weight: (!) 144.5 kg (!) 143.3 kg (!) 140.2 kg    Telemetry    NSR - Personally Reviewed  ECG    NA - Personally Reviewed  Physical Exam   GEN: No acute distress.   Neck: No  JVD Cardiac: RRR, no murmurs, rubs, or gallops.  Respiratory:    Decreased breath sounds at the bases.  GI: Soft, nontender, non-distended  MS:    Moderately severe edema bilateral legs.  Neuro:  Nonfocal  Psych: Normal affect   Labs    Chemistry Recent Labs  Lab 12/16/19 1339  12/16/19 2006 12/17/19 0758 12/18/19 0441 12/19/19 0532  NA   < >  --  140 140 138  K   < >  --  4.9 4.8 4.3  CL   < >  --  108 104 102  CO2   < >  --  22 28 29   GLUCOSE   < >  --  83 61* 166*  BUN   < >  --  24* 27* 25*  CREATININE   < >  --  1.86* 2.06* 1.74*  CALCIUM   < >  --  8.4* 8.3* 8.3*  PROT  --  5.8*  --   --   --   ALBUMIN  --  2.4*  --   --   --   AST  --  22  --   --   --   ALT  --  22  --   --   --   ALKPHOS  --  43  --   --   --   BILITOT  --  0.6  --   --   --   GFRNONAA   < >  --  32* 28* 35*  GFRAA   < >  --  37* 33* 40*  ANIONGAP   < >  --  10 8 7    < > = values in this interval not displayed.     Hematology Recent Labs  Lab 12/16/19 1339 12/17/19 0758 12/19/19 0532  WBC 6.4 6.8  --   RBC 3.96 3.95 3.80*  HGB 10.3* 10.1*  --   HCT 34.4* 34.7*  --   MCV 86.9 87.8  --   MCH 26.0 25.6*  --   MCHC 29.9* 29.1*  --   RDW 17.8* 17.8*  --   PLT 355 337  --     Cardiac EnzymesNo results for input(s): TROPONINI in the last 168 hours. No results for input(s): TROPIPOC in the last 168 hours.   BNP Recent Labs  Lab 12/16/19 1339  BNP 1,325.4*     DDimer No results for input(s): DDIMER in the last 168 hours.   Radiology    US RENAL  Result Date: 12/18/2019 CLINICAL DATA:  Acute renal failure.  Hypertension and diabetes. EXAM: RENAL / URINARY TRACT ULTRASOUND COMPLETE COMPARISON:  None. FINDINGS: Right Kidney: Renal measurements: 9.2 x 4.5 x 5.0 cm = volume: 107 mL. Increased echogenicity of the renal parenchymal tissue. No hydronephrosis. No focal lesion. Left Kidney: Not found by sonography. Bladder: Appears normal for degree of bladder distention. Other: None. IMPRESSION: Right kidney slightly small and echogenic but without obstruction. Left kidney could not be found by the sonographer. Urine present in the bladder. Electronically Signed   By: Nelson Chimes M.D.   On: 12/18/2019 11:32   ECHOCARDIOGRAM COMPLETE  Result Date: 12/18/2019     ECHOCARDIOGRAM REPORT   Patient Name:   Joanna [name]  Date of Exam: 12/17/2019 Medical Rec #:  [id number]  Height:       59.0 in Accession #:    [id number] Weight:       318.6 lb Date of Birth:  1970/12/17  BSA:          2.248 m Patient Age:    49 years   BP:           135/78 mmHg Patient Gender: F          HR:           90 bpm. Exam Location:  Inpatient Procedure: 2D Echo, Cardiac Doppler and Color Doppler Indications:    I50.33 Acute on chronic diastolic (congestive) heart failure  History:        Patient has no prior history of Echocardiogram examinations.                 Risk Factors:Hypertension and Diabetes.  Sonographer:    Jonelle Sidle Dance Referring Phys: [id number] Athena Masse  Sonographer Comments: Patient is morbidly obese. IMPRESSIONS  1. Left ventricular ejection fraction, by estimation, is 60 to 65%. The left ventricle has normal function. The left ventricle has no regional wall motion abnormalities. There is moderate left ventricular hypertrophy. Left ventricular diastolic parameters are indeterminate.  2. Right ventricular systolic function is normal. The right ventricular size is normal. There is mildly elevated pulmonary artery systolic pressure. The estimated right ventricular systolic pressure is 03.7 mmHg.  3. Left atrial size was mild to moderately dilated.  4. Right atrial size was mildly dilated.  5. Small pericardial effusion. The pericardial effusion is circumferential. Mild septal shudder and dilated IVC. No respiratory variation in mitral inflow, no RV diastolic collapse. No tamponade physiology noted.  6. The mitral valve is normal in structure. Trivial mitral valve regurgitation. No evidence of mitral stenosis.  7. The aortic valve is grossly normal. Aortic valve regurgitation is trivial. No aortic stenosis is present.  8. The inferior vena cava is dilated in size with <50% respiratory variability, suggesting right atrial pressure of 15 mmHg. FINDINGS  Left Ventricle: Left ventricular ejection  fraction, by estimation, is 60 to 65%. The left ventricle has normal function. The left ventricle has no regional wall motion abnormalities. The left ventricular internal cavity size was normal in size. There is  moderate left ventricular hypertrophy. Left ventricular diastolic parameters are indeterminate. Right Ventricle: The right ventricular size is normal. No increase in right ventricular wall thickness. Right ventricular systolic function is normal. There is mildly elevated pulmonary artery systolic pressure. The tricuspid regurgitant velocity is 2.67  m/s, and with an assumed right atrial pressure of 15 mmHg, the estimated right ventricular systolic pressure is 79.0 mmHg. Left Atrium: Left atrial size was mild to moderately dilated. Right Atrium: Right atrial size was mildly dilated. Pericardium: Small pericardial effusion. A small pericardial effusion is present. The pericardial effusion is circumferential. Mitral Valve: The mitral valve is normal in structure. Normal mobility of the mitral valve leaflets. Trivial mitral valve regurgitation. No evidence of mitral valve stenosis. Tricuspid Valve: The tricuspid valve is normal in structure. Tricuspid valve regurgitation is trivial. No evidence of tricuspid stenosis. Aortic Valve: The aortic valve is grossly normal. Aortic valve regurgitation is trivial. No aortic stenosis is present. Pulmonic Valve: The pulmonic valve was not well visualized. Pulmonic valve regurgitation is mild. No evidence of pulmonic stenosis. Aorta: The aortic root is normal in size and structure. Venous: The inferior vena cava is dilated in size with less than 50% respiratory variability, suggesting right atrial pressure of 15 mmHg. IAS/Shunts: The interatrial septum was not well visualized.  LEFT VENTRICLE PLAX 2D LVIDd:         4.92 cm  Diastology LVIDs:         3.40 cm  LV e' lateral:   6.20 cm/s LV PW:         1.56 cm  LV E/e' lateral: 17.7 LV IVS:        1.40 cm  LV e' medial:     6.42 cm/s LVOT diam:     2.00 cm  LV E/e' medial:  17.1 LV SV:         60 LV SV Index:   27 LVOT Area:     3.14 cm  RIGHT VENTRICLE RV Basal diam:  3.06 cm RV Mid diam:    2.65 cm RV S prime:     14.80 cm/s TAPSE (M-mode): 2.1 cm LEFT ATRIUM              Index       RIGHT ATRIUM           Index LA diam:        5.40 cm  2.40 cm/m  RA Area:     21.10 cm LA Vol (A2C):   101.0 ml 44.93 ml/m RA Volume:   60.00 ml  26.69 ml/m LA Vol (A4C):   87.7 ml  39.02 ml/m LA Biplane Vol: 96.0 ml  42.71 ml/m  AORTIC VALVE LVOT Vmax:   103.00 cm/s LVOT Vmean:  66.900 cm/s LVOT VTI:    0.190 m  AORTA Ao Root diam: 3.10 cm Ao Asc diam:  3.30 cm MITRAL VALVE  TRICUSPID VALVE MV Area (PHT): 2.83 cm     TR Peak grad:   28.5 mmHg MV Decel Time: 268 msec     TR Vmax:        267.00 cm/s MV E velocity: 109.50 cm/s                             SHUNTS                             Systemic VTI:  0.19 m                             Systemic Diam: 2.00 cm Cherlynn Kaiser MD Electronically signed by Cherlynn Kaiser MD Signature Date/Time: 12/18/2019/12:36:02 AM    Final     Cardiac Studies   ECHO:  1. Left ventricular ejection fraction, by estimation, is 60 to 65%. The  left ventricle has normal function. The left ventricle has no regional  wall motion abnormalities. There is moderate left ventricular hypertrophy.  Left ventricular diastolic  parameters are indeterminate.  2. Right ventricular systolic function is normal. The right ventricular  size is normal. There is mildly elevated pulmonary artery systolic  pressure. The estimated right ventricular systolic pressure is 46.2 mmHg.  3. Left atrial size was mild to moderately dilated.  4. Right atrial size was mildly dilated.  5. Small pericardial effusion. The pericardial effusion is  circumferential. Mild septal shudder and dilated IVC. No respiratory  variation in mitral inflow, no RV diastolic collapse. No tamponade  physiology noted.  6. The mitral  valve is normal in structure. Trivial mitral valve  regurgitation. No evidence of mitral stenosis.  7. The aortic valve is grossly normal. Aortic valve regurgitation is  trivial. No aortic stenosis is present.  8. The inferior vena cava is dilated in size with <50% respiratory  variability, suggesting right atrial pressure of 15 mmHg.    Patient Profile     49 y.o. female with morbid obesity, hypertension type 2 diabetes as well as intellectual disability who receives care from her cousin and mother and recently has had progressive dyspnea, acute on chronic weight gain and generalized anasarca. Additionally she presented with hypertensive urgency  Assessment & Plan    ACUTE ON CHRONIC DIASTOLIC DYSFUNCTION:      Net negative 6 liters this admission.  Net negative 4.4 liters yesterday.  Continue IV diuresis.   CKD IIIA:    Creat is slightly improved. Continue to follow.      HYPERTENSIVE URGENCY:     BPs still elevated.  I will increase the nitrate.  Continue current hydralazine.    ELEVATED TROPONIN:  Not diagnostic of an acute coronary syndrome.  Likely related to hypertension.  Manage in the context of treating  BP.      For questions or updates, please contact [facility] Please consult [email or web address] for contact info under Cardiology/STEMI.   Signed, Minus Breeding, MD  12/19/2019, 10:34 AM

## 2019-12-19 NOTE — Progress Notes (Signed)
PROGRESS NOTE    Rilea Arutyunyan  OQH:[id number] DOB: September 22, 1970 DOA: 12/16/2019 PCP: Marliss Coots, NP    Brief Narrative:  49 y.o. female with medical history significant for diabetes, hypertension, morbid obesity and intellectual disability/illiteracy who presents to the emergency room from urgent care with hypoxia with O2 sats 88% and concern for congestive heart failure.  Patient initially presented to [facility] emergency room gloomy in the day but left without being seen and went to the urgent care.  History is taken from patient, but also from her niece and caregiver and her caregivers mother.  Patient is intellectually disabled and is unable to read and is cared for by her sister.  Patient has been having lower extremity edema for several months and over the past 2 weeks she started having swelling in the lower abdomen.  Neither patient or family noticed shortness of breath but when questioned further, patient states she sometimes gets out of breath when she is walking in the home.  She mentions that she got bitten by an insect a few days ago on her lower abdomen and her aunt said she had been treating it with Benadryl and topical hydrocortisone.  She has had no fever or chills.  Reported no cough.  Patient's blood pressure was noted to be high in urgent care and she did say that her blood pressure is usually up when she goes to the doctor.  The aunt said they spoke to the doctor about the swelling that has been ongoing for months but no fluid pill have been prescribed.  She denies nausea or vomiting, abdominal pain or change in bowel habits.  Denies dysuria.  History is limited due to patient being mentally challenged.   ED Course: Patient arrived to the ER on O2 at 2 L with sats of 95%.  Blood pressure 190/95 heart rate 103.  She was afebrile.  Blood work revealed BNP of 1324, creatinine of 1.79.  Normal liver function.  WBC 6.4, hemoglobin 10.3.  Troponin not done in the ER.  EKG showed sinus rhythm at a  rate of 98 with no acute ST-T wave changes.  Chest x-ray showed mild cardiomegaly, prominence of the interstitial lung markings reflecting either atelectasis and interstitial edema or atypical/viral pneumonia.  Patient was given a dose of Lasix.  Hospitalist consulted for admission.  Assessment & Plan:   Principal Problem:   New onset of congestive heart failure (HCC) Active Problems:   HTN (hypertension)   Type 2 diabetes mellitus without complication (HCC)   Anasarca   AKI (acute kidney injury) ([place])   Obesity, Class III, BMI 40-49.9 (morbid obesity) ([place])   Depression   Intellectual disability   Acute diastolic heart failure (HCC)   Acute respiratory failure with hypoxia (HCC)   Hypertensive urgency   New onset of diastolic congestive heart failure with acute exacerbation ([place])   Anasarca -Patient presented with 4+ edema, interstitial edema on chest x-ray BNP over 1300 -Will continue with hydralazine for now for bp management.  No ACE/ARB due to AKI.  No beta-blocker due to acute heart failure,  -Patient is continued with lasix 74m IV bid with very good output today -Echocardiogram performed, normal LVEF -Given Cr peaking to over 2 and new hx of heart failure, consulted Cardiology for assistance, appreciate input -Continue to diurese per Cardiology -Repeat bmet in AM  Acute respiratory failure with hypoxia ([place] -O2 sat 88% on room air with EMS at time of presentation -Secondary to above -now on minimal  O2 support, appears to be breathing more comfortably  Hypertensive urgency -BP 190/95 at presentation -Holding home amlodipine due to anasarca.  Holding home HCTZ -bp remains poorly controlled. Currently on 72m q8h hydralazine and 354mimdur. Imdur increased to 12025mer Cardiology    Type 2 diabetes mellitus without complication (HCC) -Sliding scale insulin coverage pending med rec.  Holding oral glimepiride and Metformin while in hospital -Hypoglycemic recently,  continued on gentle d10 IVF. Glucose now trending up. Will hold further dextrose fluids    AKI (acute kidney injury) ([place] -Cr had trended up to over 2 -Ordered and reviewed renal US.[place] evidence of obstructive disease, however L kidney not visualized -Renal function somewhat improved. Repeat bmet in AM    Obesity, Class III, BMI 40-49.9 (morbid obesity) ([place] complicates overall prognosis and care -Recommend diet/lifestyle modification -Pt appears to remain bed bound. Will consult PT    Intellectual disability with illiteracy -Patient aunt that she is intellectually delayed and unable to read -Increase nursing assistance with written communication  DVT prophylaxis: Lovenox subq Code Status: Full Family Communication: Pt in room, family not at bedside  Status is: Inpatient  Remains inpatient appropriate because:IV treatments appropriate due to intensity of illness or inability to take PO   Dispo: The patient is from: Home              Anticipated d/c is to: Home              Anticipated d/c date is: > 3 days              Patient currently is not medically stable to d/c.  Consultants:   Cardiology  Procedures:     Antimicrobials: Anti-infectives (From admission, onward)   None      Subjective: Reports feeling better today  Objective: Vitals:   12/19/19 0225 12/19/19 0354 12/19/19 0835 12/19/19 1132  BP: (!) 161/88 (!) 184/97 (!) 152/100 (!) 164/89  Pulse: 100 88 97 87  Resp: 19 19  20   Temp: 97.7 F (36.5 C) 97.8 F (36.6 C)  97.7 F (36.5 C)  TempSrc: Oral Oral  Oral  SpO2: 97% 100% 93% 100%  Weight: (!) 140.2 kg     Height:        Intake/Output Summary (Last 24 hours) at 12/19/2019 1443 Last data filed at 12/19/2019 1300 Gross per 24 hour  Intake 790.57 ml  Output 4975 ml  Net -4184.43 ml   Filed Weights   12/17/19 1813 12/18/19 0357 12/19/19 0225  Weight: (!) 144.5 kg (!) 143.3 kg (!) 140.2 kg     Examination: General exam: Conversant, in no acute distress Respiratory system: normal chest rise, clear, no audible wheezing Cardiovascular system: regular rhythm, s1-s2 Gastrointestinal system: Nondistended, nontender, pos BS Central nervous system: No seizures, no tremors Extremities: No cyanosis, no joint deformities, BLE edema improving Skin: No rashes, no pallor Psychiatry: Affect normal // no auditory hallucinations   Data Reviewed: I have personally reviewed following labs and imaging studies  CBC: Recent Labs  Lab 12/16/19 1339 12/17/19 0758  WBC 6.4 6.8  HGB 10.3* 10.1*  HCT 34.4* 34.7*  MCV 86.9 87.8  PLT 355 [id number] Metabolic Panel: Recent Labs  Lab 12/16/19 1339 12/17/19 0758 12/18/19 0441 12/19/19 0532  NA 139 140 140 138  K 4.2 4.9 4.8 4.3  CL 108 108 104 102  CO2 22 22 28 29   GLUCOSE 169* 83 61* 166*  BUN 20  24* 27* 25*  CREATININE 1.79* 1.86* 2.06* 1.74*  CALCIUM 8.4* 8.4* 8.3* 8.3*   GFR: Estimated Creatinine Clearance: 52.3 mL/min (A) (by C-G formula based on SCr of 1.74 mg/dL (H)). Liver Function Tests: Recent Labs  Lab 12/16/19 2006  AST 22  ALT 22  ALKPHOS 43  BILITOT 0.6  PROT 5.8*  ALBUMIN 2.4*   No results for input(s): LIPASE, AMYLASE in the last 168 hours. No results for input(s): AMMONIA in the last 168 hours. Coagulation Profile: No results for input(s): INR, PROTIME in the last 168 hours. Cardiac Enzymes: No results for input(s): CKTOTAL, CKMB, CKMBINDEX, TROPONINI in the last 168 hours. BNP (last 3 results) No results for input(s): PROBNP in the last 8760 hours. HbA1C: Recent Labs    12/17/19 0758  HGBA1C 7.4*   CBG: Recent Labs  Lab 12/18/19 2103 12/19/19 0045 12/19/19 0352 12/19/19 0719 12/19/19 1129  GLUCAP 123* 125* 136* 137* 164*   Lipid Profile: No results for input(s): CHOL, HDL, LDLCALC, TRIG, CHOLHDL, LDLDIRECT in the last 72 hours. Thyroid Function Tests: Recent Labs    12/17/19 0758   TSH 2.496   Anemia Panel: Recent Labs    12/19/19 0532  VITAMINB12 357  FOLATE 8.6  FERRITIN 51  TIBC 360  IRON 32  RETICCTPCT 2.2   Sepsis Labs: No results for input(s): PROCALCITON, LATICACIDVEN in the last 168 hours.  Recent Results (from the past 240 hour(s))  SARS Coronavirus 2 by RT PCR (hospital order, performed in [facility] hospital lab) Nasopharyngeal Nasopharyngeal Swab     Status: None   Collection Time: 12/16/19  9:32 PM   Specimen: Nasopharyngeal Swab  Result Value Ref Range Status   SARS Coronavirus 2 NEGATIVE NEGATIVE Final    Comment: (NOTE) SARS-CoV-2 target nucleic acids are NOT DETECTED.  The SARS-CoV-2 RNA is generally detectable in upper and lower respiratory specimens during the acute phase of infection. The lowest concentration of SARS-CoV-2 viral copies this assay can detect is 250 copies / mL. A negative result does not preclude SARS-CoV-2 infection and should not be used as the sole basis for treatment or other patient management decisions.  A negative result may occur with improper specimen collection / handling, submission of specimen other than nasopharyngeal swab, presence of viral mutation(s) within the areas targeted by this assay, and inadequate number of viral copies (<250 copies / mL). A negative result must be combined with clinical observations, patient history, and epidemiological information.  Fact Sheet for Patients:   StrictlyIdeas.no  Fact Sheet for Healthcare Providers: BankingDealers.co.za  This test is not yet approved or  cleared by the [place] FDA and has been authorized for detection and/or diagnosis of SARS-CoV-2 by FDA under an Emergency Use Authorization (EUA).  This EUA will remain in effect (meaning this test can be used) for the duration of the COVID-19 declaration under Section 564(b)(1) of the Act, 21 U.S.C. section 360bbb-3(b)(1), unless the authorization is  terminated or revoked sooner.  Performed at [facility], [place] [address], [place], [place] [address]      Radiology Studies: US RENAL  Result Date: 12/18/2019 CLINICAL DATA:  Acute renal failure.  Hypertension and diabetes. EXAM: RENAL / URINARY TRACT ULTRASOUND COMPLETE COMPARISON:  None. FINDINGS: Right Kidney: Renal measurements: 9.2 x 4.5 x 5.0 cm = volume: 107 mL. Increased echogenicity of the renal parenchymal tissue. No hydronephrosis. No focal lesion. Left Kidney: Not found by sonography. Bladder: Appears normal for degree of bladder distention. Other: None. IMPRESSION:  Right kidney slightly small and echogenic but without obstruction. Left kidney could not be found by the sonographer. Urine present in the bladder. Electronically Signed   By: Nelson Chimes M.D.   On: 12/18/2019 11:32   ECHOCARDIOGRAM COMPLETE  Result Date: 12/18/2019    ECHOCARDIOGRAM REPORT   Patient Name:   Charnita Sambrano  Date of Exam: 12/17/2019 Medical Rec #:  [id number]  Height:       59.0 in Accession #:    [id number] Weight:       318.6 lb Date of Birth:  1970/07/29  BSA:          2.248 m Patient Age:    35 years   BP:           135/78 mmHg Patient Gender: F          HR:           90 bpm. Exam Location:  Inpatient Procedure: 2D Echo, Cardiac Doppler and Color Doppler Indications:    I50.33 Acute on chronic diastolic (congestive) heart failure  History:        Patient has no prior history of Echocardiogram examinations.                 Risk Factors:Hypertension and Diabetes.  Sonographer:    Jonelle Sidle Dance Referring Phys: [id number] Athena Masse  Sonographer Comments: Patient is morbidly obese. IMPRESSIONS  1. Left ventricular ejection fraction, by estimation, is 60 to 65%. The left ventricle has normal function. The left ventricle has no regional wall motion abnormalities. There is moderate left ventricular hypertrophy. Left ventricular diastolic parameters are indeterminate.  2. Right ventricular systolic function is normal.  The right ventricular size is normal. There is mildly elevated pulmonary artery systolic pressure. The estimated right ventricular systolic pressure is 98.3 mmHg.  3. Left atrial size was mild to moderately dilated.  4. Right atrial size was mildly dilated.  5. Small pericardial effusion. The pericardial effusion is circumferential. Mild septal shudder and dilated IVC. No respiratory variation in mitral inflow, no RV diastolic collapse. No tamponade physiology noted.  6. The mitral valve is normal in structure. Trivial mitral valve regurgitation. No evidence of mitral stenosis.  7. The aortic valve is grossly normal. Aortic valve regurgitation is trivial. No aortic stenosis is present.  8. The inferior vena cava is dilated in size with <50% respiratory variability, suggesting right atrial pressure of 15 mmHg. FINDINGS  Left Ventricle: Left ventricular ejection fraction, by estimation, is 60 to 65%. The left ventricle has normal function. The left ventricle has no regional wall motion abnormalities. The left ventricular internal cavity size was normal in size. There is  moderate left ventricular hypertrophy. Left ventricular diastolic parameters are indeterminate. Right Ventricle: The right ventricular size is normal. No increase in right ventricular wall thickness. Right ventricular systolic function is normal. There is mildly elevated pulmonary artery systolic pressure. The tricuspid regurgitant velocity is 2.67  m/s, and with an assumed right atrial pressure of 15 mmHg, the estimated right ventricular systolic pressure is 38.2 mmHg. Left Atrium: Left atrial size was mild to moderately dilated. Right Atrium: Right atrial size was mildly dilated. Pericardium: Small pericardial effusion. A small pericardial effusion is present. The pericardial effusion is circumferential. Mitral Valve: The mitral valve is normal in structure. Normal mobility of the mitral valve leaflets. Trivial mitral valve regurgitation. No  evidence of mitral valve stenosis. Tricuspid Valve: The tricuspid valve is normal in structure. Tricuspid valve regurgitation is trivial. No evidence  of tricuspid stenosis. Aortic Valve: The aortic valve is grossly normal. Aortic valve regurgitation is trivial. No aortic stenosis is present. Pulmonic Valve: The pulmonic valve was not well visualized. Pulmonic valve regurgitation is mild. No evidence of pulmonic stenosis. Aorta: The aortic root is normal in size and structure. Venous: The inferior vena cava is dilated in size with less than 50% respiratory variability, suggesting right atrial pressure of 15 mmHg. IAS/Shunts: The interatrial septum was not well visualized.  LEFT VENTRICLE PLAX 2D LVIDd:         4.92 cm  Diastology LVIDs:         3.40 cm  LV e' lateral:   6.20 cm/s LV PW:         1.56 cm  LV E/e' lateral: 17.7 LV IVS:        1.40 cm  LV e' medial:    6.42 cm/s LVOT diam:     2.00 cm  LV E/e' medial:  17.1 LV SV:         60 LV SV Index:   27 LVOT Area:     3.14 cm  RIGHT VENTRICLE RV Basal diam:  3.06 cm RV Mid diam:    2.65 cm RV S prime:     14.80 cm/s TAPSE (M-mode): 2.1 cm LEFT ATRIUM              Index       RIGHT ATRIUM           Index LA diam:        5.40 cm  2.40 cm/m  RA Area:     21.10 cm LA Vol (A2C):   101.0 ml 44.93 ml/m RA Volume:   60.00 ml  26.69 ml/m LA Vol (A4C):   87.7 ml  39.02 ml/m LA Biplane Vol: 96.0 ml  42.71 ml/m  AORTIC VALVE LVOT Vmax:   103.00 cm/s LVOT Vmean:  66.900 cm/s LVOT VTI:    0.190 m  AORTA Ao Root diam: 3.10 cm Ao Asc diam:  3.30 cm MITRAL VALVE                TRICUSPID VALVE MV Area (PHT): 2.83 cm     TR Peak grad:   28.5 mmHg MV Decel Time: 268 msec     TR Vmax:        267.00 cm/s MV E velocity: 109.50 cm/s                             SHUNTS                             Systemic VTI:  0.19 m                             Systemic Diam: 2.00 cm Cherlynn Kaiser MD Electronically signed by Cherlynn Kaiser MD Signature Date/Time: 12/18/2019/12:36:02 AM    Final       Scheduled Meds: . aspirin EC  81 mg Oral Daily  . enoxaparin (LOVENOX) injection  40 mg Subcutaneous Daily  . feeding supplement (PRO-STAT SUGAR FREE 64)  30 mL Oral BID  . furosemide  60 mg Intravenous Q12H  . hydrALAZINE  50 mg Oral Q8H  . insulin aspart  0-5 Units Subcutaneous QHS  . insulin aspart  0-9 Units Subcutaneous TID WC  . [START ON  12/20/2019] isosorbide mononitrate  120 mg Oral Daily  . multivitamin with minerals  1 tablet Oral Daily  . sodium chloride flush  3 mL Intravenous Q12H   Continuous Infusions: . sodium chloride    . dextrose 20 mL/hr at 12/18/19 0100     LOS: 3 days   Marylu Lund, MD [facility] Pager On Amion  If 7PM-7AM, please contact night-coverage 12/19/2019, 2:43 PM

## 2019-12-20 DIAGNOSIS — I5031 Acute diastolic (congestive) heart failure: Secondary | ICD-10-CM

## 2019-12-20 DIAGNOSIS — N1831 Chronic kidney disease, stage 3a: Secondary | ICD-10-CM

## 2019-12-20 DIAGNOSIS — I110 Hypertensive heart disease with heart failure: Secondary | ICD-10-CM

## 2019-12-20 LAB — GLUCOSE, CAPILLARY
Glucose-Capillary: 112 mg/dL — ABNORMAL HIGH (ref 70–99)
Glucose-Capillary: 175 mg/dL — ABNORMAL HIGH (ref 70–99)
Glucose-Capillary: 151 mg/dL — ABNORMAL HIGH (ref 70–99)
Glucose-Capillary: 173 mg/dL — ABNORMAL HIGH (ref 70–99)

## 2019-12-20 LAB — BASIC METABOLIC PANEL
Anion gap: 10 (ref 5–15)
BUN: 26 mg/dL — ABNORMAL HIGH (ref 6–20)
CO2: 29 mmol/L (ref 22–32)
Calcium: 8.5 mg/dL — ABNORMAL LOW (ref 8.9–10.3)
Chloride: 98 mmol/L (ref 98–111)
Creatinine, Ser: 1.48 mg/dL — ABNORMAL HIGH (ref 0.44–1.00)
GFR calc Af Amer: 49 mL/min — ABNORMAL LOW (ref >60)
GFR calc non Af Amer: 42 mL/min — ABNORMAL LOW (ref >60)
Glucose, Bld: 120 mg/dL — ABNORMAL HIGH (ref 70–99)
Potassium: 4.3 mmol/L (ref 3.5–5.1)
Sodium: 137 mmol/L (ref 135–145)

## 2019-12-20 NOTE — Evaluation (Signed)
Physical Therapy Evaluation Patient Details Name: Joanna [name] MRN: [id number] DOB: 29-Jan-1971 Today's Date: 12/20/2019   History of Present Illness  49 y.o. female with medical history significant for diabetes, hypertension, morbid obesity and intellectual disability/illiteracy who presents to the emergency room from urgent care with hypoxia with O2 sats 88% and concern for congestive heart failure. Patient is intellectually disabled and is unable to read and is cared for by her sister. Patient has been having lower extremity edema for several months and over the past 2 weeks she started having swelling in the lower abdomen. In ED pt found to have 4+edema interstitial edema on chest x-ray BNP over 1300. Admitted for New onset of congestive heart failure with acute exacerbation and acute respiratory failure with hypoxia  Clinical Impression  PTA pt living with sister and niece in single story home with steps to enter. Pt reports independence with mobility, walking 2x day for exercise, pt able to bathe/dress on her own, has a caregiver 1x/week  who aides with "portion control" and sister assists with iADLs. Pt is currently limited in safe mobility by decreased understanding of supplemental O2 usage and purse lip breathing to recover oxygen saturation, despite maximal visual and auditory cuing, this in presence of ambulation with increased velocity that causes increased O2 demand. Pt is min guard for transfers and ambulation without AD, and min A for ascent/descent of 4 steps with rail. PT recommending HHPT for energy conservation and management of supplemental O2 if it is still required at d/c.    Follow Up Recommendations Home health PT;Supervision/Assistance - 24 hour    Equipment Recommendations  None recommended by PT       Precautions / Restrictions Precautions Precautions: None Restrictions Weight Bearing Restrictions: No      Mobility  Bed Mobility               General bed  mobility comments: sitting up in recliner on entry   Transfers Overall transfer level: Needs assistance Equipment used: None Transfers: Sit to/from Stand Sit to Stand: Min guard         General transfer comment: min guard for safety   Ambulation/Gait Ambulation/Gait assistance: Min guard Gait Distance (Feet): 450 Feet Assistive device: None Gait Pattern/deviations: Step-through pattern;Decreased stride length;Wide base of support Gait velocity: too fast for conditions   General Gait Details: min guard for safety with waddling gait no over LoB, mild instability due to increased gait velocity   Stairs Stairs: Yes Stairs assistance: Min assist Stair Management: One rail Right;Forwards;Step to pattern;Alternating pattern Number of Stairs: 4 General stair comments: min A for steadying with use of R on R, pt has bilateral rails at home, step to pattern to ascend, step over step to descend        Balance Overall balance assessment: Needs assistance Sitting-balance support: Feet supported;No upper extremity supported Sitting balance-Leahy Scale: Fair     Standing balance support: No upper extremity supported;During functional activity Standing balance-Leahy Scale: Fair                               Pertinent Vitals/Pain Pain Assessment: No/denies pain    Home Living Family/patient expects to be discharged to:: Private residence Living Arrangements: Other relatives (sister, niece, and caregiver ) Available Help at Discharge: Family;Personal care attendant Type of Home: House Home Access: Stairs to enter   [employer] of Steps:  (unclear how many) Home Layout: One level Home  Equipment: None Additional Comments: unable to attain full house set up     Prior Function Level of Independence: Needs assistance   Gait / Transfers Assistance Needed: ambulates for exercise   ADL's / Homemaking Assistance Needed: bathes and dresses on own,  sister/caregiver assist with iADLs    Comments: caregiver works on portion Conservation officer, nature        Extremity/Trunk Assessment   Upper Extremity Assessment Upper Extremity Assessment: Defer to OT evaluation    Lower Extremity Assessment Lower Extremity Assessment: Generalized weakness       Communication   Communication: Expressive difficulties;Receptive difficulties  Cognition Arousal/Alertness: Awake/alert Behavior During Therapy: Impulsive Overall Cognitive Status: History of cognitive impairments - at baseline Area of Impairment: Safety/judgement;Following commands;Awareness;Problem solving                       Following Commands: Follows multi-step commands inconsistently;Follows one step commands inconsistently Safety/Judgement: Decreased awareness of safety;Decreased awareness of deficits Awareness: Emergent Problem Solving: Slow processing;Requires verbal cues;Requires tactile cues;Difficulty sequencing General Comments: pt with poor understanding of current condition especially how to maintain O2 saturation with use of [place], difficulty with breathing in through mouth out through mouth, can breath in/out mouth or nose      General Comments General comments (skin integrity, edema, etc.): pitting edema, increased O2 demand with ambulation pt 90%O2 on RA at rest, drops to 83%O2 with mobility, ambulated with 2L O2 via [place] unable to maintain SaO2 >85%O2    Exercises     Assessment/Plan    PT Assessment Patient needs continued PT services  PT Problem List Decreased cognition;Decreased safety awareness;Cardiopulmonary status limiting activity;Obesity       PT Treatment Interventions DME instruction;Gait training;Stair training;Functional mobility training;Therapeutic activities;Therapeutic exercise;Balance training;Cognitive remediation;Patient/family education    PT Goals (Current goals can be found in the Care Plan section)  Acute Rehab PT  Goals Patient Stated Goal: go hme PT Goal Formulation: With patient Time For Goal Achievement: 01/03/20 Potential to Achieve Goals: Good    Frequency Min 3X/week    AM-PAC PT "6 Clicks" Mobility  Outcome Measure Help needed turning from your back to your side while in a flat bed without using bedrails?: None Help needed moving from lying on your back to sitting on the side of a flat bed without using bedrails?: A Little Help needed moving to and from a bed to a chair (including a wheelchair)?: None Help needed standing up from a chair using your arms (e.g., wheelchair or bedside chair)?: None Help needed to walk in hospital room?: None Help needed climbing 3-5 steps with a railing? : A Little 6 Click Score: 22    End of Session Equipment Utilized During Treatment: Gait belt;Oxygen Activity Tolerance: Patient tolerated treatment well Patient left: in chair;with call bell/phone within reach;with nursing/sitter in room Nurse Communication: Mobility status PT Visit Diagnosis: Unsteadiness on feet (R26.81);Other abnormalities of gait and mobility (R26.89);Muscle weakness (generalized) (M62.81);Difficulty in walking, not elsewhere classified (R26.2)    Time: 9163-8466 PT Time Calculation (min) (ACUTE ONLY): 16 min   Charges:   PT Evaluation $PT Eval Moderate Complexity: 1 Mod          Ethel Meisenheimer B. Migdalia Dk PT, DPT Acute Rehabilitation Services Pager [phone] Office [phone]   [place] 12/20/2019, 12:08 PM

## 2019-12-20 NOTE — Progress Notes (Signed)
PROGRESS NOTE    Joanna [name]  GGY:[id number] DOB: 1970-12-10 DOA: 12/16/2019 PCP: Marliss Coots, NP    Brief Narrative:  49 y.o. female with medical history significant for diabetes, hypertension, morbid obesity and intellectual disability/illiteracy who presents to the emergency room from urgent care with hypoxia with O2 sats 88% and concern for congestive heart failure.  Patient initially presented to [facility] emergency room gloomy in the day but left without being seen and went to the urgent care.  History is taken from patient, but also from her niece and caregiver and her caregivers mother.  Patient is intellectually disabled and is unable to read and is cared for by her sister.  Patient has been having lower extremity edema for several months and over the past 2 weeks she started having swelling in the lower abdomen.  Neither patient or family noticed shortness of breath but when questioned further, patient states she sometimes gets out of breath when she is walking in the home.  She mentions that she got bitten by an insect a few days ago on her lower abdomen and her aunt said she had been treating it with Benadryl and topical hydrocortisone.  She has had no fever or chills.  Reported no cough.  Patient's blood pressure was noted to be high in urgent care and she did say that her blood pressure is usually up when she goes to the doctor.  The aunt said they spoke to the doctor about the swelling that has been ongoing for months but no fluid pill have been prescribed.  She denies nausea or vomiting, abdominal pain or change in bowel habits.  Denies dysuria.  History is limited due to patient being mentally challenged.   ED Course: Patient arrived to the ER on O2 at 2 L with sats of 95%.  Blood pressure 190/95 heart rate 103.  She was afebrile.  Blood work revealed BNP of 1324, creatinine of 1.79.  Normal liver function.  WBC 6.4, hemoglobin 10.3.  Troponin not done in the ER.  EKG showed sinus rhythm at a  rate of 98 with no acute ST-T wave changes.  Chest x-ray showed mild cardiomegaly, prominence of the interstitial lung markings reflecting either atelectasis and interstitial edema or atypical/viral pneumonia.  Patient was given a dose of Lasix.  Hospitalist consulted for admission.  Assessment & Plan:   Principal Problem:   New onset of congestive heart failure (HCC) Active Problems:   HTN (hypertension)   Type 2 diabetes mellitus without complication (HCC)   Anasarca   AKI (acute kidney injury) ([place])   Obesity, Class III, BMI 40-49.9 (morbid obesity) ([place])   Depression   Intellectual disability   Acute diastolic heart failure (HCC)   Acute respiratory failure with hypoxia (HCC)   Hypertensive urgency   New onset of diastolic congestive heart failure with acute exacerbation ([place])   Anasarca -Patient presented with 4+ edema, interstitial edema on chest x-ray BNP over 1300 -Will continue with hydralazine for now for bp management.  No ACE/ARB at time of presentation due to AKI.  No beta-blocker due to acute heart failure,  -Patient is continued with lasix 53m IV bid with continued good urine output -Echocardiogram performed, normal LVEF -Given Cr peaking to over 2 and new hx of heart failure, consulted Cardiology for assistance, appreciate input -Continue to diurese for another 24-48hrs per Cardiology -Repeat bmet in AM  Acute respiratory failure with hypoxia (HOlathe -O2 sat 88% on room air with EMS at time of  presentation -Secondary to above -now on minimal O2 support, appears to be breathing more comfortably -Seen in hallway ambulating well with PT  Hypertensive urgency -BP 190/95 at presentation -Holding home amlodipine due to anasarca.  Holding home HCTZ -bp remains poorly controlled. Currently on 87m q8h hydralazine and 34mimdur. Imdur increased to 1204mer Cardiology -Possible starting ACEI or ARB if renal function improves    Type 2 diabetes mellitus without  complication (HCC) -Sliding scale insulin coverage pending med rec.  Holding oral glimepiride and Metformin while in hospital -Hypoglycemic recently,resolved with gentle d10 IVF. D10 d/c'd and glucose remains stable    AKI (acute kidney injury) (HCCColumbusCreatinine 1.79 at presentation -Cr had trended up to over 2 -Ordered and reviewed renal US.[place] evidence of obstructive disease, however L kidney not visualized -Renal function improving with diuresis. Repeat bmet in AM    Obesity, Class III, BMI 40-49.9 (morbid obesity) (HCC[place] MiguelThis complicates overall prognosis and care -Recommend diet/lifestyle modification -seen ambulating with PT    Intellectual disability with illiteracy -Patient aunt that she is intellectually delayed and unable to read -Increase nursing assistance with written communication  DVT prophylaxis: Lovenox subq Code Status: Full Family Communication: Pt in room, family not at bedside  Status is: Inpatient  Remains inpatient appropriate because:IV treatments appropriate due to intensity of illness or inability to take PO   Dispo: The patient is from: Home              Anticipated d/c is to: Home              Anticipated d/c date is: > 3 days              Patient currently is not medically stable to d/c.  Consultants:   Cardiology  Procedures:     Antimicrobials: Anti-infectives (From admission, onward)   None      Subjective: States feeling better, less swollen  Objective: Vitals:   12/19/19 2003 12/20/19 0511 12/20/19 0809 12/20/19 1138  BP: (!) 178/100 (!) 166/92 (!) 152/82 (!) 163/89  Pulse: 98 96 (!) 101 (!) 101  Resp: 20 20 20 20   Temp: 97.6 F (36.4 C) 98.6 F (37 C)  98.3 F (36.8 C)  TempSrc: Oral   Oral  SpO2: 99% 98% 92% 96%  Weight:  134.6 kg    Height:        Intake/Output Summary (Last 24 hours) at 12/20/2019 1604 Last data filed at 12/20/2019 0815 Gross per 24 hour  Intake 480 ml  Output 2500 ml  Net -2020 ml   Filed  Weights   12/18/19 0357 12/19/19 0225 12/20/19 0511  Weight: (!) 143.3 kg (!) 140.2 kg 134.6 kg    Examination: General exam: Awake, laying in bed, in nad Respiratory system: Normal respiratory effort, no wheezing Cardiovascular system: regular rate, s1, s2 Gastrointestinal system: Soft, nondistended, positive BS Central nervous system: CN2-12 grossly intact, strength intact Extremities: Perfused, no clubbing, BLE edema Skin: Normal skin turgor, no notable skin lesions seen Psychiatry: Mood normal // no visual hallucinations   Data Reviewed: I have personally reviewed following labs and imaging studies  CBC: Recent Labs  Lab 12/16/19 1339 12/17/19 0758  WBC 6.4 6.8  HGB 10.3* 10.1*  HCT 34.4* 34.7*  MCV 86.9 87.8  PLT 355 [id number] Metabolic Panel: Recent Labs  Lab 12/16/19 1339 12/17/19 0758 12/18/19 0441 12/19/19 0532 12/20/19 0344  NA 139 140 140 138 137  K 4.2 4.9 4.8 4.3  4.3  CL 108 108 104 102 98  CO2 22 22 28 29 29   GLUCOSE 169* 83 61* 166* 120*  BUN 20 24* 27* 25* 26*  CREATININE 1.79* 1.86* 2.06* 1.74* 1.48*  CALCIUM 8.4* 8.4* 8.3* 8.3* 8.5*   GFR: Estimated Creatinine Clearance: 59.8 mL/min (A) (by C-G formula based on SCr of 1.48 mg/dL (H)). Liver Function Tests: Recent Labs  Lab 12/16/19 2006  AST 22  ALT 22  ALKPHOS 43  BILITOT 0.6  PROT 5.8*  ALBUMIN 2.4*   No results for input(s): LIPASE, AMYLASE in the last 168 hours. No results for input(s): AMMONIA in the last 168 hours. Coagulation Profile: No results for input(s): INR, PROTIME in the last 168 hours. Cardiac Enzymes: No results for input(s): CKTOTAL, CKMB, CKMBINDEX, TROPONINI in the last 168 hours. BNP (last 3 results) No results for input(s): PROBNP in the last 8760 hours. HbA1C: No results for input(s): HGBA1C in the last 72 hours. CBG: Recent Labs  Lab 12/19/19 1129 12/19/19 1612 12/19/19 2000 12/20/19 0607 12/20/19 1142  GLUCAP 164* 153* 145* 112* 175*   Lipid  Profile: No results for input(s): CHOL, HDL, LDLCALC, TRIG, CHOLHDL, LDLDIRECT in the last 72 hours. Thyroid Function Tests: No results for input(s): TSH, T4TOTAL, FREET4, T3FREE, THYROIDAB in the last 72 hours. Anemia Panel: Recent Labs    12/19/19 0532  VITAMINB12 357  FOLATE 8.6  FERRITIN 51  TIBC 360  IRON 32  RETICCTPCT 2.2   Sepsis Labs: No results for input(s): PROCALCITON, LATICACIDVEN in the last 168 hours.  Recent Results (from the past 240 hour(s))  SARS Coronavirus 2 by RT PCR (hospital order, performed in [facility] hospital lab) Nasopharyngeal Nasopharyngeal Swab     Status: None   Collection Time: 12/16/19  9:32 PM   Specimen: Nasopharyngeal Swab  Result Value Ref Range Status   SARS Coronavirus 2 NEGATIVE NEGATIVE Final    Comment: (NOTE) SARS-CoV-2 target nucleic acids are NOT DETECTED.  The SARS-CoV-2 RNA is generally detectable in upper and lower respiratory specimens during the acute phase of infection. The lowest concentration of SARS-CoV-2 viral copies this assay can detect is 250 copies / mL. A negative result does not preclude SARS-CoV-2 infection and should not be used as the sole basis for treatment or other patient management decisions.  A negative result may occur with improper specimen collection / handling, submission of specimen other than nasopharyngeal swab, presence of viral mutation(s) within the areas targeted by this assay, and inadequate number of viral copies (<250 copies / mL). A negative result must be combined with clinical observations, patient history, and epidemiological information.  Fact Sheet for Patients:   StrictlyIdeas.no  Fact Sheet for Healthcare Providers: BankingDealers.co.za  This test is not yet approved or  cleared by the [place] FDA and has been authorized for detection and/or diagnosis of SARS-CoV-2 by FDA under an Emergency Use Authorization (EUA).  This EUA  will remain in effect (meaning this test can be used) for the duration of the COVID-19 declaration under Section 564(b)(1) of the Act, 21 U.S.C. section 360bbb-3(b)(1), unless the authorization is terminated or revoked sooner.  Performed at [facility], [place] [address], [place], [place] [address]      Radiology Studies: No results found.  Scheduled Meds: . aspirin EC  81 mg Oral Daily  . enoxaparin (LOVENOX) injection  40 mg Subcutaneous Daily  . feeding supplement (PRO-STAT SUGAR FREE 64)  30 mL Oral BID  . furosemide  60  mg Intravenous Q12H  . hydrALAZINE  50 mg Oral Q8H  . insulin aspart  0-5 Units Subcutaneous QHS  . insulin aspart  0-9 Units Subcutaneous TID WC  . isosorbide mononitrate  120 mg Oral Daily  . multivitamin with minerals  1 tablet Oral Daily  . sodium chloride flush  3 mL Intravenous Q12H   Continuous Infusions: . sodium chloride       LOS: 4 days   Marylu Lund, MD [facility] Pager On Amion  If 7PM-7AM, please contact night-coverage 12/20/2019, 4:04 PM

## 2019-12-20 NOTE — Progress Notes (Signed)
Progress Note   Subjective   Doing well today, the patient denies CP.  SOB continues to improve.  No new concerns  Inpatient Medications    Scheduled Meds: . aspirin EC  81 mg Oral Daily  . enoxaparin (LOVENOX) injection  40 mg Subcutaneous Daily  . feeding supplement (PRO-STAT SUGAR FREE 64)  30 mL Oral BID  . furosemide  60 mg Intravenous Q12H  . hydrALAZINE  50 mg Oral Q8H  . insulin aspart  0-5 Units Subcutaneous QHS  . insulin aspart  0-9 Units Subcutaneous TID WC  . isosorbide mononitrate  120 mg Oral Daily  . multivitamin with minerals  1 tablet Oral Daily  . sodium chloride flush  3 mL Intravenous Q12H   Continuous Infusions: . sodium chloride     PRN Meds: sodium chloride, acetaminophen, ondansetron (ZOFRAN) IV, sodium chloride flush   Vital Signs    Vitals:   12/19/19 1635 12/19/19 2003 12/20/19 0511 12/20/19 0809  BP: (!) 153/78 (!) 178/100 (!) 166/92 (!) 152/82  Pulse:  98 96 (!) 101  Resp:  20 20 20   Temp:  97.6 F (36.4 C) 98.6 F (37 C)   TempSrc:  Oral    SpO2:  99% 98% 92%  Weight:   134.6 kg   Height:        Intake/Output Summary (Last 24 hours) at 12/20/2019 1043 Last data filed at 12/20/2019 0815 Gross per 24 hour  Intake 720 ml  Output 3700 ml  Net -2980 ml   Filed Weights   12/18/19 0357 12/19/19 0225 12/20/19 0511  Weight: (!) 143.3 kg (!) 140.2 kg 134.6 kg    Telemetry    sinus - Personally Reviewed  Physical Exam   GEN- The patient is overweight appearing, alert and oriented x 3 today.   Head- normocephalic, atraumatic Eyes-  Sclera clear, conjunctiva pink Ears- hearing intact Oropharynx- clear Neck- supple, Lungs-  normal work of breathing Heart- Regular rate and rhythm  GI- soft  Extremities- no clubbing, cyanosis, + edema  MS- no significant deformity or atrophy Skin- no rash or lesion Psych- euthymic mood, full affect Neuro- strength and sensation are intact   Labs    Chemistry Recent Labs  Lab  12/16/19 2006 12/17/19 0758 12/18/19 0441 12/19/19 0532 12/20/19 0344  NA  --    < > 140 138 137  K  --    < > 4.8 4.3 4.3  CL  --    < > 104 102 98  CO2  --    < > 28 29 29   GLUCOSE  --    < > 61* 166* 120*  BUN  --    < > 27* 25* 26*  CREATININE  --    < > 2.06* 1.74* 1.48*  CALCIUM  --    < > 8.3* 8.3* 8.5*  PROT 5.8*  --   --   --   --   ALBUMIN 2.4*  --   --   --   --   AST 22  --   --   --   --   ALT 22  --   --   --   --   ALKPHOS 43  --   --   --   --   BILITOT 0.6  --   --   --   --   GFRNONAA  --    < > 28* 35* 42*  GFRAA  --    < >  33* 40* 49*  ANIONGAP  --    < > 8 7 10    < > = values in this interval not displayed.     Hematology Recent Labs  Lab 12/16/19 1339 12/17/19 0758 12/19/19 0532  WBC 6.4 6.8  --   RBC 3.96 3.95 3.80*  HGB 10.3* 10.1*  --   HCT 34.4* 34.7*  --   MCV 86.9 87.8  --   MCH 26.0 25.6*  --   MCHC 29.9* 29.1*  --   RDW 17.8* 17.8*  --   PLT 355 337  --      Patient ID  49 y.o. female with morbid obesity, hypertension type 2 diabetes as well as intellectual disability who receives care from her cousin and mother and recently has had progressive dyspnea, acute on chronic weight gain and generalized anasarca. Additionally she presented with hypertensive urgency  Assessment & Plan    1.  Acute on chronic diastolic dysfunctoin Continues to improve with diuresis Continue IV lasix for another 24-48 hours I discussed Alleviate HF trial with her today and she would be interested in hearing more from our research team.  This can be done as an outpatient if needed given holiday schedule. Consider addition of ACEinhibitor given CRI and DM once creatinine is stable.  2. Acute on CRI Stable IIIA Improving with diuresis Consider adding ACEi soon (as above)  3. Hypertensive cardiovascular disease with diastolic CHF BP remains elevated Continue imdur/ hydralazine Add ace inhibitor if creatinine remains stable (as above) Could also consider  spironolactone once ACEi is titrated  4. Elevated troponin Demand ischemia No further workup planned at this time.  5. Obesity Body mass index is 59.93 kg/m. Lifestyle modification is necessary for her to do well long term.  Thompson Grayer MD, [facility] 12/20/2019 10:43 AM

## 2019-12-20 NOTE — Progress Notes (Signed)
SATURATION QUALIFICATIONS: (This note is used to comply with regulatory documentation for home oxygen)  Patient Saturations on Room Air at Rest = 90%  Patient Saturations on Room Air while Ambulating = 85%  Patient Saturations on 2 Liters of oxygen while Ambulating = 85%  Please briefly explain why patient needs home oxygen: Pt requires supplemental O2 for maintaining SaO2 >90% with ambulation. Unable to attain reliable O2 level with >2L O2, recheck before discharge.  Talbert Trembath B. Migdalia Dk PT, DPT Acute Rehabilitation Services Pager [phone] Office [phone]

## 2019-12-20 NOTE — Evaluation (Signed)
Occupational Therapy Evaluation Patient Details Name: Joanna [name] MRN: [id number] DOB: 12-Jun-1971 Today's Date: 12/20/2019    History of Present Illness 49 y.o. female with medical history significant for diabetes, hypertension, morbid obesity and intellectual disability/illiteracy who presents to the emergency room from urgent care with hypoxia with O2 sats 88% and concern for congestive heart failure. Patient is intellectually disabled and is unable to read and is cared for by her sister. Patient has been having lower extremity edema for several months and over the past 2 weeks she started having swelling in the lower abdomen. In ED pt found to have 4+edema interstitial edema on chest x-ray BNP over 1300. Admitted for New onset of congestive heart failure with acute exacerbation and acute respiratory failure with hypoxia   Clinical Impression   This 49 y/o female presents with the above. PTA pt reports completing mobility and basic ADL tasks independently, reports has a sister/caregiver who provide supervision/assistance for additional iADL/daily needs. Pt currently requiring close minguard assist for room level mobility without AD, requiring minA for toileting ADL and mod-maxA for LB ADL given increased difficulty accessing LEs at this time. Pt requiring 2L O2 with activity to maintain SpO2 >90%. She will benefit from continued acute OT services and recommend follow up [facility] services after discharge to maximize her safety and independence with ADL and mobility.     Follow Up Recommendations  Home health OT;Supervision/Assistance - 24 hour    Equipment Recommendations  Tub/shower seat           Precautions / Restrictions Precautions Precautions: None Restrictions Weight Bearing Restrictions: No      Mobility Bed Mobility               General bed mobility comments: sitting up in recliner on entry   Transfers Overall transfer level: Needs assistance Equipment used:  None Transfers: Sit to/from Stand Sit to Stand: Min guard         General transfer comment: min guard for safety     Balance Overall balance assessment: Needs assistance Sitting-balance support: Feet supported;No upper extremity supported Sitting balance-Leahy Scale: Fair     Standing balance support: No upper extremity supported;During functional activity Standing balance-Leahy Scale: Fair                             ADL either performed or assessed with clinical judgement   ADL Overall ADL's : Needs assistance/impaired Eating/Feeding: Set up;Sitting   Grooming: Min guard;Standing Grooming Details (indicate cue type and reason): close guarding for balance while standing at sink Upper Body Bathing: Set up;Supervision/ safety;Sitting   Lower Body Bathing: Moderate assistance;Sit to/from stand;Sitting/lateral leans   Upper Body Dressing : Supervision/safety;Set up;Sitting   Lower Body Dressing: Moderate assistance;Sitting/lateral leans;Sit to/from stand Lower Body Dressing Details (indicate cue type and reason): pt requiring assist to don her socks today, unable to reach her feet  Toilet Transfer: Min guard;Ambulation;Regular Toilet   Toileting- Clothing Manipulation and Hygiene: Minimal assistance;Sit to/from stand Toileting - Clothing Manipulation Details (indicate cue type and reason): pt required assist for anterior pericare, unable to perform on her own      Functional mobility during ADLs: Min guard General ADL Comments: pt with increased dyspnea with activity, requiring cues for deep breathing and to self-pace herself      Vision         Perception     Praxis      Pertinent Vitals/Pain Pain Assessment:  No/denies pain     Hand Dominance     Extremity/Trunk Assessment Upper Extremity Assessment Upper Extremity Assessment: Generalized weakness (edemous bil UEs)   Lower Extremity Assessment Lower Extremity Assessment: Generalized weakness        Communication Communication Communication: Expressive difficulties;Receptive difficulties   Cognition Arousal/Alertness: Awake/alert Behavior During Therapy: Impulsive Overall Cognitive Status: History of cognitive impairments - at baseline Area of Impairment: Safety/judgement;Following commands;Awareness;Problem solving                       Following Commands: Follows multi-step commands inconsistently;Follows one step commands inconsistently Safety/Judgement: Decreased awareness of safety;Decreased awareness of deficits Awareness: Emergent Problem Solving: Slow processing;Requires verbal cues;Requires tactile cues;Difficulty sequencing General Comments: pt with poor understanding of current condition especially how to maintain O2 saturation with use of [place], difficulty with breathing in through mouth out through mouth, can breath in/out mouth or nose   General Comments  pitting edema, increased O2 demand with ambulation pt 90%O2 on RA at rest, drops to 83%O2 with mobility, ambulated with 2L O2 via [place] unable to maintain SaO2 >85%O2    Exercises     Shoulder Instructions      Home Living Family/patient expects to be discharged to:: Private residence Living Arrangements: Other relatives (sister, niece, and caregiver ) Available Help at Discharge: Family;Personal care attendant Type of Home: House Home Access: Stairs to enter Technical brewer of Steps:  (unclear how many)   Home Layout: One level     Bathroom Shower/Tub: Tub/shower unit         Home Equipment: None   Additional Comments: unable to attain full house set up       Prior Functioning/Environment Level of Independence: Needs assistance  Gait / Transfers Assistance Needed: ambulates for exercise  ADL's / Homemaking Assistance Needed: bathes and dresses on own, sister/caregiver assist with iADLs     Comments: caregiver works on portion control         OT Problem List: Decreased  strength;Decreased activity tolerance;Obesity;Cardiopulmonary status limiting activity;Decreased knowledge of precautions;Decreased knowledge of use of DME or AE;Decreased cognition      OT Treatment/Interventions: Self-care/ADL training;Therapeutic exercise;Energy conservation;DME and/or AE instruction;Therapeutic activities;Cognitive remediation/compensation;Patient/family education;Balance training    OT Goals(Current goals can be found in the care plan section) Acute Rehab OT Goals Patient Stated Goal: go hme OT Goal Formulation: With patient Time For Goal Achievement: 01/03/20 Potential to Achieve Goals: Good  OT Frequency: Min 2X/week   Barriers to D/C:            Co-evaluation              AM-PAC OT "6 Clicks" Daily Activity     Outcome Measure Help from another person eating meals?: None Help from another person taking care of personal grooming?: A Little Help from another person toileting, which includes using toliet, bedpan, or urinal?: A Lot Help from another person bathing (including washing, rinsing, drying)?: A Lot Help from another person to put on and taking off regular upper body clothing?: A Little Help from another person to put on and taking off regular lower body clothing?: A Lot 6 Click Score: 16   End of Session Equipment Utilized During Treatment: Gait belt;Oxygen Nurse Communication: Mobility status  Activity Tolerance: Patient tolerated treatment well Patient left: in chair;with call bell/phone within reach;with chair alarm set;Other (comment) (NT present - to turn chair alarm on after vitals )  OT Visit Diagnosis: Other abnormalities of gait and mobility (  R26.89);Other (comment) (decreased activity tolerance )                Time: 8453-6468 OT Time Calculation (min): 18 min Charges:  OT General Charges $OT Visit: 1 Visit OT Evaluation $OT Eval Moderate Complexity: 1 Mod  Lou Cal, OT Acute Rehabilitation Services Pager  [phone] Office [phone]   Joanna [name] 12/20/2019, 2:17 PM

## 2019-12-21 DIAGNOSIS — F79 Unspecified intellectual disabilities: Secondary | ICD-10-CM

## 2019-12-21 LAB — GLUCOSE, CAPILLARY
Glucose-Capillary: 124 mg/dL — ABNORMAL HIGH (ref 70–99)
Glucose-Capillary: 155 mg/dL — ABNORMAL HIGH (ref 70–99)
Glucose-Capillary: 155 mg/dL — ABNORMAL HIGH (ref 70–99)
Glucose-Capillary: 141 mg/dL — ABNORMAL HIGH (ref 70–99)

## 2019-12-21 LAB — BASIC METABOLIC PANEL
Anion gap: 9 (ref 5–15)
BUN: 29 mg/dL — ABNORMAL HIGH (ref 6–20)
CO2: 32 mmol/L (ref 22–32)
Calcium: 8.6 mg/dL — ABNORMAL LOW (ref 8.9–10.3)
Chloride: 97 mmol/L — ABNORMAL LOW (ref 98–111)
Creatinine, Ser: 1.69 mg/dL — ABNORMAL HIGH (ref 0.44–1.00)
GFR calc Af Amer: 41 mL/min — ABNORMAL LOW (ref >60)
GFR calc non Af Amer: 36 mL/min — ABNORMAL LOW (ref >60)
Glucose, Bld: 136 mg/dL — ABNORMAL HIGH (ref 70–99)
Potassium: 4 mmol/L (ref 3.5–5.1)
Sodium: 138 mmol/L (ref 135–145)

## 2019-12-21 MED ORDER — FUROSEMIDE 40 MG PO TABS: 60.0000 mg | ORAL_TABLET | Freq: Every day | ORAL | Status: DC

## 2019-12-21 MED ORDER — CARVEDILOL 3.125 MG PO TABS
3.1250 mg | ORAL_TABLET | Freq: Two times a day (BID) | ORAL | Status: DC
  Administered 2019-12-21 – 2019-12-22 (×3): 3.125 mg via ORAL
  Filled 2019-12-21 (×3): qty 1

## 2019-12-21 MED ORDER — FUROSEMIDE 40 MG PO TABS
60.0000 mg | ORAL_TABLET | Freq: Every day | ORAL | Status: DC
  Administered 2019-12-21 – 2019-12-22 (×2): 60 mg via ORAL
  Filled 2019-12-21 (×2): qty 1

## 2019-12-21 NOTE — Progress Notes (Signed)
PROGRESS NOTE    Joanna [name]  KCM:[id number] DOB: 1971/05/21 DOA: 12/16/2019 PCP: Marliss Coots, NP    Brief Narrative:  49 y.o. female with medical history significant for diabetes, hypertension, morbid obesity and intellectual disability/illiteracy who presents to the emergency room from urgent care with hypoxia with O2 sats 88% and concern for congestive heart failure.  Patient initially presented to [facility] emergency room gloomy in the day but left without being seen and went to the urgent care.  History is taken from patient, but also from her niece and caregiver and her caregivers mother.  Patient is intellectually disabled and is unable to read and is cared for by her sister.  Patient has been having lower extremity edema for several months and over the past 2 weeks she started having swelling in the lower abdomen.  Neither patient or family noticed shortness of breath but when questioned further, patient states she sometimes gets out of breath when she is walking in the home.  She mentions that she got bitten by an insect a few days ago on her lower abdomen and her aunt said she had been treating it with Benadryl and topical hydrocortisone.  She has had no fever or chills.  Reported no cough.  Patient's blood pressure was noted to be high in urgent care and she did say that her blood pressure is usually up when she goes to the doctor.  The aunt said they spoke to the doctor about the swelling that has been ongoing for months but no fluid pill have been prescribed.  She denies nausea or vomiting, abdominal pain or change in bowel habits.  Denies dysuria.  History is limited due to patient being mentally challenged.   ED Course: Patient arrived to the ER on O2 at 2 L with sats of 95%.  Blood pressure 190/95 heart rate 103.  She was afebrile.  Blood work revealed BNP of 1324, creatinine of 1.79.  Normal liver function.  WBC 6.4, hemoglobin 10.3.  Troponin not done in the ER.  EKG showed sinus rhythm at a  rate of 98 with no acute ST-T wave changes.  Chest x-ray showed mild cardiomegaly, prominence of the interstitial lung markings reflecting either atelectasis and interstitial edema or atypical/viral pneumonia.  Patient was given a dose of Lasix.  Hospitalist consulted for admission.  Assessment & Plan:   Principal Problem:   New onset of congestive heart failure (HCC) Active Problems:   HTN (hypertension)   Type 2 diabetes mellitus without complication (HCC)   Anasarca   AKI (acute kidney injury) ([place])   Obesity, Class III, BMI 40-49.9 (morbid obesity) ([place])   Depression   Intellectual disability   Acute diastolic heart failure (HCC)   Acute respiratory failure with hypoxia (HCC)   Hypertensive urgency   New onset of diastolic congestive heart failure with acute exacerbation ([place])   Anasarca -Patient presented with 4+ edema, interstitial edema on chest x-ray BNP over 1300 -Will continue with hydralazine for now for bp management.  No ACE/ARB at time of presentation due to AKI.  No beta-blocker due to acute heart failure,  -Patient was continued with lasix 69m IV bid with continued good urine output -Echocardiogram performed, normal LVEF -Given Cr peaking to over 2 and new hx of heart failure, consulted Cardiology for assistance, appreciate input -Per Cardiology, plan to switch to 614mPO lasix, starting low dose BB -Repeat bmet in AM  Acute respiratory failure with hypoxia (HCC) -O2 sat 88% on room air  with EMS at time of presentation -Secondary to above -now on minimal O2 support, appears to be breathing more comfortably -Was ambulating with PT  Hypertensive urgency -BP 190/95 at presentation -Holding home amlodipine due to anasarca.  Holding home HCTZ -bp remains poorly controlled. Currently on 62m q8h hydralazine and 345mimdur 12065mCoreg added per Cardiology -Possible starting ACEI or ARB eventually if renal function improves    Type 2 diabetes mellitus without  complication (HCC) -Sliding scale insulin coverage pending med rec.  Holding oral glimepiride and Metformin while in hospital -Hypoglycemic recently,resolved with gentle d10 IVF. D10 d/c'd and glucose remains stable    AKI (acute kidney injury) [place] 1.[place] at presentation -Cr had trended up to over 2 -Ordered and reviewed renal US.[place] evidence of obstructive disease, however L kidney not visualized -Renal function improving with diuresis. Repeat bmet in AM    Obesity, Class III, BMI 40-49.9 (morbid obesity) ([place] complicates overall prognosis and care -Recommend diet/lifestyle modification -seen ambulating with PT    Intellectual disability with illiteracy -Patient aunt that she is intellectually delayed and unable to read -Increase nursing assistance with written communication  DVT prophylaxis: Lovenox subq Code Status: Full Family Communication: Pt in room, family not at bedside  Status is: Inpatient  Remains inpatient appropriate because:IV treatments appropriate due to intensity of illness or inability to take PO   Dispo: The patient is from: Home              Anticipated d/c is to: Home              Anticipated d/c date is: 1 day              Patient currently is not medically stable to d/c.  Consultants:   Cardiology  Procedures:     Antimicrobials: Anti-infectives (From admission, onward)   None      Subjective: Eager to go home soon  Objective: Vitals:   12/20/19 2001 12/21/19 0107 12/21/19 0511 12/21/19 1157  BP: (!) 173/100  (!) 172/90 (!) 178/88  Pulse: (!) 102  (!) 103 91  Resp: 18  18 18   Temp: 98 F (36.7 C)  98 F (36.7 C) 97.8 F (36.6 C)  TempSrc: Oral  Oral   SpO2: 99%  93% 97%  Weight:  131.4 kg    Height:        Intake/Output Summary (Last 24 hours) at 12/21/2019 1642 Last data filed at 12/21/2019 1600 Gross per 24 hour  Intake 1260 ml  Output 4600 ml  Net -3340 ml   Filed Weights   12/19/19 0225 12/20/19  0511 12/21/19 0107  Weight: (!) 140.2 kg 134.6 kg 131.4 kg    Examination: General exam: Conversant, in no acute distress Respiratory system: normal chest rise, clear, no audible wheezing Cardiovascular system: regular rhythm, s1-s2 Gastrointestinal system: Nondistended, nontender, pos BS Central nervous system: No seizures, no tremors Extremities: No cyanosis, no joint deformities Skin: No rashes, no pallor, BLE edema much improved Psychiatry: Affect normal // no auditory hallucinations   Data Reviewed: I have personally reviewed following labs and imaging studies  CBC: Recent Labs  Lab 12/16/19 1339 12/17/19 0758  WBC 6.4 6.8  HGB 10.3* 10.1*  HCT 34.4* 34.7*  MCV 86.9 87.8  PLT 355 [id number] Metabolic Panel: Recent Labs  Lab 12/17/19 0758 12/18/19 0441 12/19/19 0532 12/20/19 0344 12/21/19 0147  NA 140 140 138 137 138  K 4.9 4.8 4.3 4.3 4.0  CL 108 104 102 98 97*  CO2 22 28 29 29  32  GLUCOSE 83 61* 166* 120* 136*  BUN 24* 27* 25* 26* 29*  CREATININE 1.86* 2.06* 1.74* 1.48* 1.69*  CALCIUM 8.4* 8.3* 8.3* 8.5* 8.6*   GFR: Estimated Creatinine Clearance: 51.5 mL/min (A) (by C-G formula based on SCr of 1.69 mg/dL (H)). Liver Function Tests: Recent Labs  Lab 12/16/19 2006  AST 22  ALT 22  ALKPHOS 43  BILITOT 0.6  PROT 5.8*  ALBUMIN 2.4*   No results for input(s): LIPASE, AMYLASE in the last 168 hours. No results for input(s): AMMONIA in the last 168 hours. Coagulation Profile: No results for input(s): INR, PROTIME in the last 168 hours. Cardiac Enzymes: No results for input(s): CKTOTAL, CKMB, CKMBINDEX, TROPONINI in the last 168 hours. BNP (last 3 results) No results for input(s): PROBNP in the last 8760 hours. HbA1C: No results for input(s): HGBA1C in the last 72 hours. CBG: Recent Labs  Lab 12/20/19 1633 12/20/19 2111 12/21/19 0608 12/21/19 1108 12/21/19 1616  GLUCAP 151* 173* 124* 155* 155*   Lipid Profile: No results for input(s): CHOL,  HDL, LDLCALC, TRIG, CHOLHDL, LDLDIRECT in the last 72 hours. Thyroid Function Tests: No results for input(s): TSH, T4TOTAL, FREET4, T3FREE, THYROIDAB in the last 72 hours. Anemia Panel: Recent Labs    12/19/19 0532  VITAMINB12 357  FOLATE 8.6  FERRITIN 51  TIBC 360  IRON 32  RETICCTPCT 2.2   Sepsis Labs: No results for input(s): PROCALCITON, LATICACIDVEN in the last 168 hours.  Recent Results (from the past 240 hour(s))  SARS Coronavirus 2 by RT PCR (hospital order, performed in [facility] hospital lab) Nasopharyngeal Nasopharyngeal Swab     Status: None   Collection Time: 12/16/19  9:32 PM   Specimen: Nasopharyngeal Swab  Result Value Ref Range Status   SARS Coronavirus 2 NEGATIVE NEGATIVE Final    Comment: (NOTE) SARS-CoV-2 target nucleic acids are NOT DETECTED.  The SARS-CoV-2 RNA is generally detectable in upper and lower respiratory specimens during the acute phase of infection. The lowest concentration of SARS-CoV-2 viral copies this assay can detect is 250 copies / mL. A negative result does not preclude SARS-CoV-2 infection and should not be used as the sole basis for treatment or other patient management decisions.  A negative result may occur with improper specimen collection / handling, submission of specimen other than nasopharyngeal swab, presence of viral mutation(s) within the areas targeted by this assay, and inadequate number of viral copies (<250 copies / mL). A negative result must be combined with clinical observations, patient history, and epidemiological information.  Fact Sheet for Patients:   StrictlyIdeas.no  Fact Sheet for Healthcare Providers: BankingDealers.co.za  This test is not yet approved or  cleared by the [place] FDA and has been authorized for detection and/or diagnosis of SARS-CoV-2 by FDA under an Emergency Use Authorization (EUA).  This EUA will remain in effect (meaning this  test can be used) for the duration of the COVID-19 declaration under Section 564(b)(1) of the Act, 21 U.S.C. section 360bbb-3(b)(1), unless the authorization is terminated or revoked sooner.  Performed at [facility], [place] [address], [place],  [address]      Radiology Studies: No results found.  Scheduled Meds: . aspirin EC  81 mg Oral Daily  . carvedilol  3.125 mg Oral BID WC  . enoxaparin (LOVENOX) injection  40 mg Subcutaneous Daily  . feeding supplement (PRO-STAT SUGAR FREE 64)  30 mL  Oral BID  . furosemide  60 mg Oral Daily  . hydrALAZINE  50 mg Oral Q8H  . insulin aspart  0-5 Units Subcutaneous QHS  . insulin aspart  0-9 Units Subcutaneous TID WC  . isosorbide mononitrate  120 mg Oral Daily  . multivitamin with minerals  1 tablet Oral Daily  . sodium chloride flush  3 mL Intravenous Q12H   Continuous Infusions: . sodium chloride       LOS: 5 days   Marylu Lund, MD [facility] Pager On Amion  If 7PM-7AM, please contact night-coverage 12/21/2019, 4:42 PM

## 2019-12-21 NOTE — Progress Notes (Signed)
Nutrition Education Note  RD consulted for nutrition education regarding new onset CHF. Please see Initial Nutrition Assessment date 12/18/19 for additional details. RD was consulted for assessment and diet education but was unable to reach patient via phone call to provide education on that date and handouts were attached to AVS/discharge instructions.  RD was able to meet with pt today in-person. Per notes, pt with an intellectual disability is unable to read but has multiple caregivers who are able to read. RD provided education to patient and attempted to reach patient's sister Melody via phone call but was unsuccessful. Handouts left in patient's room for caregivers to review. RD contact information also provided if patient's caregivers have questions.  RD provided "Low Sodium Nutrition Therapy" handout from the Academy of Nutrition and Dietetics. Reviewed patient's dietary recall. Pt reports that she typically consumes 2-3 meals daily. Patient states that she sometimes cooks for herself. A meal may include fried chicken, greens, and sweet potato. Patient states that she knows she cannot eat as much fried chicken "for my heart." Patient also able to verbalize the need to restrict fluid intake.  Provided examples on ways to decrease sodium intake in diet. Discouraged intake of processed foods and use of salt shaker. Encouraged fresh fruits and vegetables as well as whole grain sources of carbohydrates to maximize fiber intake.  RD discussed why it is important for patient to adhere to diet recommendations, and emphasized the role of fluids, foods to avoid, and importance of weighing self daily. Teach back method used.  Expect fair compliance.  Body mass index is 58.51 kg/m. Pt meets criteria for obesity class III based on current BMI.  Current diet order is Heart Healthy/Carb Modified with 1800 mL fluid restriction, patient is consuming approximately 75-100% of meals at this time. Labs and  medications reviewed. RD contact information provided.  Pro-stat 30 ml po BID and MVI with minerals ordered at initial assessment. RD will continue to follow pt during this admission.   Gaynell Face, MS, RD, LDN Inpatient Clinical Dietitian Please see AMiON for contact information.

## 2019-12-21 NOTE — Progress Notes (Addendum)
Progress Note  Patient Name: Joanna [name] Date of Encounter: 12/21/2019  Primary Cardiologist:   Pixie Casino, MD   Subjective   Another 3L negaitve overnight - now 12L negative. Creatinine trended up overnight after improvement yesterday (1.48 - 1.69).  May be close to euvolemic at this point.   Inpatient Medications    Scheduled Meds:  aspirin EC  81 mg Oral Daily   enoxaparin (LOVENOX) injection  40 mg Subcutaneous Daily   feeding supplement (PRO-STAT SUGAR FREE 64)  30 mL Oral BID   furosemide  60 mg Intravenous Q12H   hydrALAZINE  50 mg Oral Q8H   insulin aspart  0-5 Units Subcutaneous QHS   insulin aspart  0-9 Units Subcutaneous TID WC   isosorbide mononitrate  120 mg Oral Daily   multivitamin with minerals  1 tablet Oral Daily   sodium chloride flush  3 mL Intravenous Q12H   Continuous Infusions:  sodium chloride     PRN Meds: sodium chloride, acetaminophen, ondansetron (ZOFRAN) IV, sodium chloride flush   Vital Signs    Vitals:   12/20/19 1138 12/20/19 2001 12/21/19 0107 12/21/19 0511  BP: (!) 163/89 (!) 173/100  (!) 172/90  Pulse: (!) 101 (!) 102  (!) 103  Resp: 20 18  18   Temp: 98.3 F (36.8 C) 98 F (36.7 C)  98 F (36.7 C)  TempSrc: Oral Oral  Oral  SpO2: 96% 99%  93%  Weight:   131.4 kg   Height:        Intake/Output Summary (Last 24 hours) at 12/21/2019 0825 Last data filed at 12/21/2019 0524 Gross per 24 hour  Intake 920 ml  Output 4100 ml  Net -3180 ml   Filed Weights   12/19/19 0225 12/20/19 0511 12/21/19 0107  Weight: (!) 140.2 kg 134.6 kg 131.4 kg    Telemetry    Sinus tachycardia - Personally Reviewed  ECG    NA - Personally Reviewed  Physical Exam   GEN: No acute distress.   Neck: No  JVD Cardiac: RRR, no murmurs, rubs, or gallops.  Respiratory:    Decreased breath sounds at the bases.  GI: Soft, nontender, non-distended  MS:   Trace to 1+ LE edema.  Neuro:  Nonfocal  Psych: Normal affect   Labs      Chemistry Recent Labs  Lab 12/16/19 2006 12/17/19 8756 12/19/19 0532 12/20/19 0344 12/21/19 0147  NA  --    < > 138 137 138  K  --    < > 4.3 4.3 4.0  CL  --    < > 102 98 97*  CO2  --    < > 29 29 32  GLUCOSE  --    < > 166* 120* 136*  BUN  --    < > 25* 26* 29*  CREATININE  --    < > 1.74* 1.48* 1.69*  CALCIUM  --    < > 8.3* 8.5* 8.6*  PROT 5.8*  --   --   --   --   ALBUMIN 2.4*  --   --   --   --   AST 22  --   --   --   --   ALT 22  --   --   --   --   ALKPHOS 43  --   --   --   --   BILITOT 0.6  --   --   --   --  GFRNONAA  --    < > 35* 42* 36*  GFRAA  --    < > 40* 49* 41*  ANIONGAP  --    < > 7 10 9    < > = values in this interval not displayed.     Hematology Recent Labs  Lab 12/16/19 1339 12/17/19 0758 12/19/19 0532  WBC 6.4 6.8  --   RBC 3.96 3.95 3.80*  HGB 10.3* 10.1*  --   HCT 34.4* 34.7*  --   MCV 86.9 87.8  --   MCH 26.0 25.6*  --   MCHC 29.9* 29.1*  --   RDW 17.8* 17.8*  --   PLT 355 337  --     Cardiac EnzymesNo results for input(s): TROPONINI in the last 168 hours. No results for input(s): TROPIPOC in the last 168 hours.   BNP Recent Labs  Lab 12/16/19 1339  BNP 1,325.4*     DDimer No results for input(s): DDIMER in the last 168 hours.   Radiology    No results found.  Cardiac Studies   ECHO:  1. Left ventricular ejection fraction, by estimation, is 60 to 65%. The  left ventricle has normal function. The left ventricle has no regional  wall motion abnormalities. There is moderate left ventricular hypertrophy.  Left ventricular diastolic  parameters are indeterminate.  2. Right ventricular systolic function is normal. The right ventricular  size is normal. There is mildly elevated pulmonary artery systolic  pressure. The estimated right ventricular systolic pressure is 34.7 mmHg.  3. Left atrial size was mild to moderately dilated.  4. Right atrial size was mildly dilated.  5. Small pericardial effusion. The  pericardial effusion is  circumferential. Mild septal shudder and dilated IVC. No respiratory  variation in mitral inflow, no RV diastolic collapse. No tamponade  physiology noted.  6. The mitral valve is normal in structure. Trivial mitral valve  regurgitation. No evidence of mitral stenosis.  7. The aortic valve is grossly normal. Aortic valve regurgitation is  trivial. No aortic stenosis is present.  8. The inferior vena cava is dilated in size with <50% respiratory  variability, suggesting right atrial pressure of 15 mmHg.    Patient Profile     49 y.o. female with morbid obesity, hypertension type 2 diabetes as well as intellectual disability who receives care from her cousin and mother and recently has had progressive dyspnea, acute on chronic weight gain and generalized anasarca. Additionally she presented with hypertensive urgency  Assessment & Plan    ACUTE ON CHRONIC DIASTOLIC DYSFUNCTION:      Net negative 12 liters this admission - creatinine improved, now trending up. Would switch to oral lasix 60 mg daily today.  HR's now elevated, may be as a result of hydralazine/diuresis - no evidence of recurrent Wenckebach. Would recommend starting low dose BB and monitor as tachycardia is not good for diastolic HF.  CKD IIIA:    Creat is slightly improved, now uptrending. May be at diuresis endpoint.   HYPERTENSIVE URGENCY:     BPs still elevated - with rising creatinine, hesistant to add ACE-I or ARB at this point. Start low dose coreg and uptitrate as tolerated - monitor for conduction delay.  ELEVATED TROPONIN:  Not diagnostic of an acute coronary syndrome.  Likely related to hypertension.  Manage in the context of treating  BP.     Close to d/c, possibly tomorrow depending on renal function and response to diuresis.  For questions or updates,  please contact [place] Please consult [email or web address] for contact info under Cardiology/STEMI.   Pixie Casino, MD, [facility], [place] Director of the Advanced Lipid Disorders &  Cardiovascular Risk Reduction Clinic Diplomate of the American Board of Clinical Lipidology Attending Cardiologist  Direct Dial: [phone]   Fax: [phone]  Website:  [email or web address]  Pixie Casino, MD  12/21/2019, 8:25 AM

## 2019-12-22 LAB — COMPREHENSIVE METABOLIC PANEL
ALT: 17 U/L (ref 0–44)
AST: 18 U/L (ref 15–41)
Albumin: 2.2 g/dL — ABNORMAL LOW (ref 3.5–5.0)
Alkaline Phosphatase: 36 U/L — ABNORMAL LOW (ref 38–126)
Anion gap: 11 (ref 5–15)
BUN: 31 mg/dL — ABNORMAL HIGH (ref 6–20)
CO2: 30 mmol/L (ref 22–32)
Calcium: 8.7 mg/dL — ABNORMAL LOW (ref 8.9–10.3)
Chloride: 96 mmol/L — ABNORMAL LOW (ref 98–111)
Creatinine, Ser: 1.6 mg/dL — ABNORMAL HIGH (ref 0.44–1.00)
GFR calc Af Amer: 44 mL/min — ABNORMAL LOW (ref >60)
GFR calc non Af Amer: 38 mL/min — ABNORMAL LOW (ref >60)
Glucose, Bld: 132 mg/dL — ABNORMAL HIGH (ref 70–99)
Potassium: 4.3 mmol/L (ref 3.5–5.1)
Sodium: 137 mmol/L (ref 135–145)
Total Bilirubin: 0.6 mg/dL (ref 0.3–1.2)
Total Protein: 6 g/dL — ABNORMAL LOW (ref 6.5–8.1)

## 2019-12-22 LAB — GLUCOSE, CAPILLARY
Glucose-Capillary: 117 mg/dL — ABNORMAL HIGH (ref 70–99)
Glucose-Capillary: 159 mg/dL — ABNORMAL HIGH (ref 70–99)

## 2019-12-22 MED ORDER — HYDRALAZINE HCL 50 MG PO TABS: 50.0000 mg | ORAL_TABLET | Freq: Three times a day (TID) | ORAL | 0 refills | Status: DC

## 2019-12-22 MED ORDER — ISOSORBIDE MONONITRATE ER 120 MG PO TB24: 120.0000 mg | ORAL_TABLET | Freq: Every day | ORAL | 0 refills | Status: AC

## 2019-12-22 MED ORDER — FUROSEMIDE 20 MG PO TABS: 60.0000 mg | ORAL_TABLET | Freq: Every day | ORAL | 0 refills | Status: DC

## 2019-12-22 MED ORDER — ASPIRIN 81 MG PO TBEC: 81.0000 mg | DELAYED_RELEASE_TABLET | Freq: Every day | ORAL | 11 refills | Status: AC

## 2019-12-22 MED ORDER — CARVEDILOL 3.125 MG PO TABS: 3.1250 mg | ORAL_TABLET | Freq: Two times a day (BID) | ORAL | 0 refills | Status: DC

## 2019-12-22 MED FILL — ASPIRIN LOW DOSE 81 MG TBEC: 81 | 30 days supply | Qty: 30 | Fill #0

## 2019-12-22 MED FILL — hydrALAZINE HCL 50 MG TABS: 50 | 30 days supply | Qty: 90 | Fill #0

## 2019-12-22 MED FILL — ISOSORBIDE MN ER 60 MG TAB: 60 | 30 days supply | Qty: 60 | Fill #0

## 2019-12-22 MED FILL — FUROSEMIDE 20 MG TAB: 20 | 30 days supply | Qty: 90 | Fill #0

## 2019-12-22 MED FILL — CARVEDILOL 3.125 MG TABLET: 3.125 | 30 days supply | Qty: 60 | Fill #0

## 2019-12-22 NOTE — TOC Transition Note (Signed)
Transition of Care [facility]) - CM/SW Discharge Note   Patient Details  Name: Francesa Eugenio MRN: [id number] Date of Birth: 03/20/1971  Transition of Care [facility]) CM/SW Contact:  Zenon Mayo, RN Phone Number: 12/22/2019, 2:40 PM   Clinical Narrative:    Patient is for dc today, NCM offered choice, she states she does not have a preference.  NCM made referral to [facility] with [facility]. She is able to take referral for [place], [place].  Soc will begin 24 to 48 hrs post dc.  Also Staff RN Rona Ravens states patient does not need any home oxygen, she checked ambulatory sats prior to dc.  TOC filled medications. Patient family member transported her home via car.    Final next level of care: [place] Barriers to Discharge: No Barriers Identified   Patient Goals and CMS Choice Patient states their goals for this hospitalization and ongoing recovery are:: get better CMS Medicare.gov Compare Post Acute Care list provided to:: Patient Choice offered to / list presented to : Patient  Discharge Placement                       Discharge Plan and Services                  DME Agency: NA       HH Arranged: PT, OT HH Agency: [place] Date [place]: 12/22/19 Time [place]: 1440 Representative spoke with at [place]: [place] Determinants of Health ([place]) Interventions     Readmission Risk Interventions No flowsheet data found.

## 2019-12-22 NOTE — Progress Notes (Signed)
Occupational Therapy Treatment Patient Details Name: Joanna [name] MRN: [id number] DOB: February 03, 1971 Today's Date: 12/22/2019    History of present illness 49 y.o. female presenting with new onset CHF with acute exacerbation and acute respiratory failure with hypoxia. PMHx significant for DM II, HTN, morbid obesity, and intellectual disability/illiteracy.    OT comments  Patient met seated in recliner in agreement with OT treatment session with focus on household mobility, self-care re-education, and education on safety/energy conservation. Therapy session limited this date 2/2 hypertension with systolic BP in 830'N and diastolic in 40'H. RN aware. Patient would benefit from continued acute OT services in prep for safe d/c home with recommendation for HHOT and 24 hour supervision/assist from family/caregiver.    Follow Up Recommendations  Home health OT;Supervision/Assistance - 24 hour    Equipment Recommendations  Tub/shower seat    Recommendations for Other Services      Precautions / Restrictions Precautions Precautions: None Restrictions Weight Bearing Restrictions: No       Mobility Bed Mobility Overal bed mobility: Needs Assistance Bed Mobility: Supine to Sit     Supine to sit: Supervision     General bed mobility comments: Seated in recliner upon entry.   Transfers Overall transfer level: Needs assistance Equipment used: None Transfers: Sit to/from Stand Sit to Stand: Min guard;Supervision         General transfer comment: min guard for safety     Balance Overall balance assessment: Needs assistance Sitting-balance support: Feet supported;No upper extremity supported Sitting balance-Leahy Scale: Fair     Standing balance support: No upper extremity supported;During functional activity Standing balance-Leahy Scale: Fair Standing balance comment: can stand statically without UE support                           ADL either performed or assessed with  clinical judgement   ADL Overall ADL's : Needs assistance/impaired     Grooming: Min guard;Supervision/safety;Standing Grooming Details (indicate cue type and reason): Cues for safety and Min guard to close supervision A for balance. Patient required seated RB at sink 2/2 fatigue.                               Functional mobility during ADLs: Min guard General ADL Comments: Activity limited 2/2 elevated BP with systolic in 680'S and diastolic in 81'J.      Vision       Perception     Praxis      Cognition Arousal/Alertness: Awake/alert Behavior During Therapy: Impulsive Overall Cognitive Status: History of cognitive impairments - at baseline Area of Impairment: Safety/judgement;Following commands;Awareness;Problem solving                       Following Commands: Follows multi-step commands inconsistently;Follows one step commands inconsistently Safety/Judgement: Decreased awareness of safety;Decreased awareness of deficits Awareness: Emergent Problem Solving: Slow processing;Requires verbal cues;Requires tactile cues;Difficulty sequencing General Comments: Patient with impulsivity and decreased safety awareness with RW.         Exercises     Shoulder Instructions       General Comments Pt maintained SpO2 >90% on RA with mobility to sink for grooming tasks.     Pertinent Vitals/ Pain       Pain Assessment: No/denies pain  Home Living  Prior Functioning/Environment              Frequency  Min 2X/week        Progress Toward Goals  OT Goals(current goals can now be found in the care plan section)  Progress towards OT goals: Progressing toward goals  Acute Rehab OT Goals Patient Stated Goal: To return home.  OT Goal Formulation: With patient Time For Goal Achievement: 01/03/20 Potential to Achieve Goals: Good ADL Goals Pt Will Perform Grooming: with modified  independence;standing Pt Will Perform Lower Body Bathing: sitting/lateral leans;sit to/from stand;with supervision Pt Will Perform Lower Body Dressing: with supervision;sit to/from stand;sitting/lateral leans Pt Will Transfer to Toilet: with supervision;ambulating Pt Will Perform Toileting - Clothing Manipulation and hygiene: with supervision;sit to/from stand;sitting/lateral leans  Plan Discharge plan remains appropriate;Frequency remains appropriate    Co-evaluation                 AM-PAC OT "6 Clicks" Daily Activity     Outcome Measure   Help from another person eating meals?: None Help from another person taking care of personal grooming?: A Little Help from another person toileting, which includes using toliet, bedpan, or urinal?: A Lot Help from another person bathing (including washing, rinsing, drying)?: A Lot Help from another person to put on and taking off regular upper body clothing?: A Little Help from another person to put on and taking off regular lower body clothing?: A Lot 6 Click Score: 16    End of Session Equipment Utilized During Treatment: Gait belt  OT Visit Diagnosis: Other abnormalities of gait and mobility (R26.89);Other (comment)   Activity Tolerance Patient tolerated treatment well   Patient Left in chair;with call bell/phone within reach;with chair alarm set   Nurse Communication          Time: 0233-4356 OT Time Calculation (min): 16 min  Charges: OT General Charges $OT Visit: 1 Visit OT Treatments $Self Care/Home Management : 8-22 mins  Jisel Fleet H. OTR/L Supplemental OT, Department of rehab services [phone]   Chonda Baney R H. 12/22/2019, 12:55 PM

## 2019-12-22 NOTE — Progress Notes (Signed)
Physical Therapy Treatment Patient Details Name: Joanna [name] MRN: [id number] DOB: 11/01/1970 Today's Date: 12/22/2019    History of Present Illness 49 y.o. female with medical history significant for diabetes, hypertension, morbid obesity and intellectual disability/illiteracy who presents to the emergency room from urgent care with hypoxia with O2 sats 88% and concern for congestive heart failure. Patient is intellectually disabled and is unable to read and is cared for by her sister. Patient has been having lower extremity edema for several months and over the past 2 weeks she started having swelling in the lower abdomen. In ED pt found to have 4+edema interstitial edema on chest x-ray BNP over 1300. Admitted for New onset of congestive heart failure with acute exacerbation and acute respiratory failure with hypoxia    PT Comments    Pt admitted with above diagnosis. Pt was able to ambulate with 2LO2 and RW with overall fair safety with occasional cues to stay close to rW and slow down.  Continue to follow acutely.  Pt currently with functional limitations due to the deficits listed below (see PT Problem List). Pt will benefit from skilled PT to increase their independence and safety with mobility to allow discharge to the venue listed below.    SATURATION QUALIFICATIONS: (This note is used to comply with regulatory documentation for home oxygen)  Patient Saturations on Room Air at Rest = 86%  Patient Saturations on Room Air while Ambulating = NT as pt desaturates on RA at rest.   Patient Saturations on 2 Liters of oxygen while Ambulating = 93%  Please briefly explain why patient needs home oxygen:Pt needed O2 at rest and with activity to maintain sats >90%.   Follow Up Recommendations  Home health PT;Supervision/Assistance - 24 hour     Equipment Recommendations  None recommended by PT    Recommendations for Other Services       Precautions / Restrictions Precautions Precautions:  None Restrictions Weight Bearing Restrictions: No    Mobility  Bed Mobility Overal bed mobility: Needs Assistance Bed Mobility: Supine to Sit     Supine to sit: Supervision        Transfers Overall transfer level: Needs assistance Equipment used: None Transfers: Sit to/from Stand Sit to Stand: Min guard;Supervision         General transfer comment: min guard for safety   Ambulation/Gait Ambulation/Gait assistance: Min guard Gait Distance (Feet): 450 Feet Assistive device: Rolling walker (2 wheeled) Gait Pattern/deviations: Step-through pattern;Decreased stride length;Wide base of support;Trunk flexed Gait velocity: too fast for conditions   General Gait Details: min guard for safety with waddling gait with  no overt LoB, mild instability due to increased gait velocity, cues to stay close to RW.     Stairs             Wheelchair Mobility    Modified Rankin (Stroke Patients Only)       Balance Overall balance assessment: Needs assistance Sitting-balance support: Feet supported;No upper extremity supported Sitting balance-Leahy Scale: Fair     Standing balance support: No upper extremity supported;During functional activity Standing balance-Leahy Scale: Fair Standing balance comment: can stand statically without UE support                            Cognition Arousal/Alertness: Awake/alert Behavior During Therapy: Impulsive Overall Cognitive Status: History of cognitive impairments - at baseline Area of Impairment: Safety/judgement;Following commands;Awareness;Problem solving  Following Commands: Follows multi-step commands inconsistently;Follows one step commands inconsistently Safety/Judgement: Decreased awareness of safety;Decreased awareness of deficits Awareness: Emergent Problem Solving: Slow processing;Requires verbal cues;Requires tactile cues;Difficulty sequencing General Comments: pt with poor  understanding of current condition especially how to maintain O2 saturation with use of [place], difficulty with breathing in through mouth out through mouth, can breath in/out mouth or nose      Exercises      General Comments        Pertinent Vitals/Pain Pain Assessment: No/denies pain    Home Living                      Prior Function            PT Goals (current goals can now be found in the care plan section) Acute Rehab PT Goals Patient Stated Goal: go hme Progress towards PT goals: Progressing toward goals    Frequency    Min 3X/week      PT Plan Current plan remains appropriate    Co-evaluation              AM-PAC PT "6 Clicks" Mobility   Outcome Measure  Help needed turning from your back to your side while in a flat bed without using bedrails?: None Help needed moving from lying on your back to sitting on the side of a flat bed without using bedrails?: A Little Help needed moving to and from a bed to a chair (including a wheelchair)?: None Help needed standing up from a chair using your arms (e.g., wheelchair or bedside chair)?: None Help needed to walk in hospital room?: A Little Help needed climbing 3-5 steps with a railing? : A Little 6 Click Score: 21    End of Session Equipment Utilized During Treatment: Gait belt;Oxygen Activity Tolerance: Patient tolerated treatment well Patient left: in chair;with call bell/phone within reach;with nursing/sitter in room Nurse Communication: Mobility status PT Visit Diagnosis: Unsteadiness on feet (R26.81);Other abnormalities of gait and mobility (R26.89);Muscle weakness (generalized) (M62.81);Difficulty in walking, not elsewhere classified (R26.2)     Time: 7218-2883 PT Time Calculation (min) (ACUTE ONLY): 12 min  Charges:  $Gait Training: 8-22 mins                     Joanna [name],PT [place] Pager:  [phone]  Office:  [place] 12/22/2019, 11:35  AM

## 2019-12-22 NOTE — Progress Notes (Signed)
Progress Note  Patient Name: Joanna [name] Date of Encounter: 12/22/2019  [facility] Cardiologist: Pixie Casino, MD   Subjective   Out 2.1 L yesterday.  Net 1.1 L.  Less short of breath.  Inpatient Medications    Scheduled Meds: . aspirin EC  81 mg Oral Daily  . carvedilol  3.125 mg Oral BID WC  . enoxaparin (LOVENOX) injection  40 mg Subcutaneous Daily  . feeding supplement (PRO-STAT SUGAR FREE 64)  30 mL Oral BID  . furosemide  60 mg Oral Daily  . hydrALAZINE  50 mg Oral Q8H  . insulin aspart  0-5 Units Subcutaneous QHS  . insulin aspart  0-9 Units Subcutaneous TID WC  . isosorbide mononitrate  120 mg Oral Daily  . multivitamin with minerals  1 tablet Oral Daily  . sodium chloride flush  3 mL Intravenous Q12H   Continuous Infusions: . sodium chloride     PRN Meds: sodium chloride, acetaminophen, ondansetron (ZOFRAN) IV, sodium chloride flush   Vital Signs    Vitals:   12/21/19 1157 12/21/19 2028 12/22/19 0552 12/22/19 0906  BP: (!) 178/88 (!) 164/96 (!) 174/92 140/61  Pulse: 91 97 96 90  Resp: 18 18 18    Temp: 97.8 F (36.6 C) 98.2 F (36.8 C) 97.8 F (36.6 C)   TempSrc:  Oral Oral   SpO2: 97% 95% 97% 99%  Weight:   130 kg   Height:        Intake/Output Summary (Last 24 hours) at 12/22/2019 1037 Last data filed at 12/22/2019 0900 Gross per 24 hour  Intake 840 ml  Output 2100 ml  Net -1260 ml   Last 3 Weights 12/22/2019 12/21/2019 12/20/2019  Weight (lbs) 286 lb 9.6 oz 289 lb 11 oz 296 lb 11.8 oz  Weight (kg) 130 kg 131.4 kg 134.6 kg      Telemetry    Sinus rhythm, no AV conduction delay, no pauses heart rate in the 90s- Personally Reviewed  ECG    No new- Personally Reviewed  Physical Exam   GEN: No acute distress.   Neck: No JVD Cardiac: RRR, no murmurs, rubs, or gallops.  Respiratory: Clear to auscultation bilaterally. GI: Soft, nontender, non-distended obese MS:  Minimal lower extremity edema; No deformity. Neuro:  Nonfocal  Psych: Normal  affect   Labs    High Sensitivity Troponin:   Recent Labs  Lab 12/17/19 0758  TROPONINIHS 31*      Chemistry Recent Labs  Lab 12/16/19 2006 12/17/19 0758 12/20/19 0344 12/21/19 0147 12/22/19 0422  NA  --    < > 137 138 137  K  --    < > 4.3 4.0 4.3  CL  --    < > 98 97* 96*  CO2  --    < > 29 32 30  GLUCOSE  --    < > 120* 136* 132*  BUN  --    < > 26* 29* 31*  CREATININE  --    < > 1.48* 1.69* 1.60*  CALCIUM  --    < > 8.5* 8.6* 8.7*  PROT 5.8*  --   --   --  6.0*  ALBUMIN 2.4*  --   --   --  2.2*  AST 22  --   --   --  18  ALT 22  --   --   --  17  ALKPHOS 43  --   --   --  36*  BILITOT  0.6  --   --   --  0.6  GFRNONAA  --    < > 42* 36* 38*  GFRAA  --    < > 49* 41* 44*  ANIONGAP  --    < > 10 9 11    < > = values in this interval not displayed.     Hematology Recent Labs  Lab 12/16/19 1339 12/17/19 0758 12/19/19 0532  WBC 6.4 6.8  --   RBC 3.96 3.95 3.80*  HGB 10.3* 10.1*  --   HCT 34.4* 34.7*  --   MCV 86.9 87.8  --   MCH 26.0 25.6*  --   MCHC 29.9* 29.1*  --   RDW 17.8* 17.8*  --   PLT 355 337  --     BNP Recent Labs  Lab 12/16/19 1339  BNP 1,325.4*     DDimer No results for input(s): DDIMER in the last 168 hours.   Radiology    No results found.  Cardiac Studies   Echo EF 65% dilated IVC.  Small pericardial effusion.  No tamponade.  Mildly elevated pulmonary pressures estimated.  Patient Profile     49 y.o. female with morbid obesity, hypertension type 2 diabetes as well as intellectual disability who receives care from her cousin and mother and recently has had progressive dyspnea, acute on chronic weight gain and generalized anasarca. Additionally she presented with hypertensive urgency.  Assessment & Plan    ACUTE ON CHRONIC DIASTOLIC DYSFUNCTION:      Net negative 13 liters this admission - creatinine improved slightly today 1.6.  Now on oral lasix 60 mg daily today.  HR's now elevated, may be as a result of hydralazine/diuresis  - no evidence of recurrent Wenckebach.  Now on low dose BB. Tachycardia is not good for diastolic HF.  CKD IIIA:     Has stabilized.  HYPERTENSIVE URGENCY:     BPs improved- with recent rising creatinine, hesistant to add ACE-I or ARB at this point. Started low dose coreg  - monitor for conduction delay.  On isosorbide and hydralazine  ELEVATED TROPONIN:  Not diagnostic of an acute coronary syndrome.  Likely related to hypertension.  Manage in the context of treating  BP.     Minimally elevated.  Morbid obesity: Continue to encourage weight loss.  Seems pretty reasonable for discharge at this point.      For questions or updates, please contact [place] Please consult [email or web address] for contact info under        Signed, Candee Furbish, MD  12/22/2019, 10:37 AM

## 2019-12-22 NOTE — Discharge Summary (Signed)
Physician Discharge Summary  Joanna [name] AXE:[id number] DOB: [birth year] DOA: 12/16/2019  PCP: Marliss Coots, NP  Admit date: 12/16/2019 Discharge date: 12/22/2019  Admitted From: Home Disposition:  Home  Recommendations for Outpatient Follow-up:  1. Follow up with PCP in 1-2 weeks 2. Follow up with Cardiology as scheduled  Home Health:PT, OT   Discharge Condition:Improved CODE STATUS:Full Diet recommendation: Diabetic, heart healthy  Brief/Interim Summary: 49 y.o.femalewith medical history significant fordiabetes, hypertension, morbid obesity and intellectual disability/illiteracy who presents to the emergency room from urgent care with hypoxia with O2 sats 88%and concern for congestive heart failure. Patient initially presented to [facility] emergency room gloomy in the day but left without being seen and went to the urgent care. History is taken from patient, but also from her niece and caregiver and her caregivers mother. Patient is intellectually disabled and is unable to read and is cared for by her sister. Patient has been having lower extremity edema for several months and over the past 2 weeks she started having swelling in the lower abdomen. Neither patient or family noticed shortness of breath but when questioned further, patient states she sometimes gets out of breath when she is walking in the home. She mentions that she got bitten by an insect a few days ago on her lower abdomen and her aunt said she had been treating it with Benadryl and topical hydrocortisone. She has had no fever or chills. Reported no cough. Patient's blood pressure was noted to be high in urgent care and she did say that her blood pressure is usually up when she goes to the doctor. The aunt said they spoke to the doctor about the swelling that has been ongoing for months but no fluid pill have been prescribed. She denies nausea or vomiting, abdominal pain or change in bowel habits. Denies dysuria.  History is limited due to patient being mentally challenged.  ED Course:Patient arrived to the ER on O2 at 2 L with sats of 95%. Blood pressure 190/95 heart rate 103. She was afebrile. Blood work revealed BNP of 1324, creatinine of 1.79. Normal liver function. WBC 6.4, hemoglobin 10.3. Troponin not done in the ER. EKG showed sinus rhythm at a rate of 98 with no acute ST-T wave changes. Chest x-ray showed mild cardiomegaly, prominence of the interstitial lung markings reflecting either atelectasis and interstitial edema or atypical/viral pneumonia. Patient was given a dose of Lasix. Hospitalist consulted for admission.  Discharge Diagnoses:  Principal Problem:   New onset of congestive heart failure (HCC) Active Problems:   HTN (hypertension)   Type 2 diabetes mellitus without complication (HCC)   Anasarca   AKI (acute kidney injury) ([place])   Obesity, Class III, BMI 40-49.9 (morbid obesity) ([place])   Depression   Intellectual disability   Acute diastolic heart failure (HCC)   Acute respiratory failure with hypoxia (HCC)   Hypertensive urgency   New onset of diastolic congestive heart failurewith acute exacerbation(HCC) Anasarca -Patient presented with 4+ edema, interstitial edema on chest x-ray BNP over 1300 -Will continue with hydralazine for now for bp management. No ACE/ARB at time of presentation due to AKI. No beta-blocker due to acute heart failure,  -Patient was continued with lasix 79m IV bid with continued good urine output -Echocardiogram performed, normal LVEF -Given Cr peaking to over 2 and new hx of heart failure, consulted Cardiology for assistance, appreciate input -Per Cardiology, had switched to 640mPO lasix, starting low dose BB -clear for d/c today with close outpatient follow up  Acute respiratory failure with hypoxia (HCC) -O2 sat 88% on room air with EMS at time of presentation -Secondary to above -now on minimal O2 support, appears to be  breathing more comfortably -Was ambulating with PT, weaned to room air  Hypertensive urgency -BP 190/95 at presentation -Holding home amlodipine due to anasarca. Holding home HCTZ -bp remains poorly controlled. Currently on 15m q8h hydralazine and 339mimdur 12069mCoreg added per Cardiology -Possible starting ACEI or ARB eventually if renal function improves  Type 2 diabetes mellitus without complication (HCC) -Sliding scale insulin coverage pending med rec. Holding oral glimepiride and Metformin while in hospital -Hypoglycemic recently,resolved with gentle d10 IVF. D10 d/c'd and glucose remains stable  AKI (acute kidney injury) ([place] 1.79 at presentation -Cr had trended up to over 2 -Ordered and reviewed renal US.[place] evidence of obstructive disease, however L kidney not visualized -Renal function improving with diuresis. Repeat bmet in AM  Obesity, Class III, BMI 40-49.9 (morbid obesity) ([place] complicates overall prognosis and care -Recommend diet/lifestyle modification -seen ambulating with PT  Intellectual disabilitywith illiteracy -Patientauntthat she is intellectually delayed and unable to read -Increase nursing assistance with written communication   Discharge Instructions   Allergies as of 12/22/2019   No Known Allergies     Medication List    STOP taking these medications   amLODipine 10 MG tablet Commonly known as: NORVASC   hydrochlorothiazide 25 MG tablet Commonly known as: HYDRODIURIL     TAKE these medications   aspirin 81 MG EC tablet Take 1 tablet (81 mg total) by mouth daily. Swallow whole. Start taking on: December 23, 2019   carvedilol 3.125 MG tablet Commonly known as: COREG Take 1 tablet (3.125 mg total) by mouth 2 (two) times daily with a meal.   furosemide 20 MG tablet Commonly known as: LASIX Take 3 tablets (60 mg total) by mouth daily. Start taking on: December 23, 2019   glimepiride 2 MG tablet Commonly  known as: AMARYL Take 2 mg by mouth daily with breakfast.   hydrALAZINE 50 MG tablet Commonly known as: APRESOLINE Take 1 tablet (50 mg total) by mouth every 8 (eight) hours.   isosorbide mononitrate 120 MG 24 hr tablet Commonly known as: IMDUR Take 1 tablet (120 mg total) by mouth daily. Start taking on: December 23, 2019   metformin 500 MG (OSM) 24 hr tablet Commonly known as: FORTAMET Take 500 mg by mouth daily with breakfast.            Durable Medical Equipment  (From admission, onward)         Start     Ordered   12/22/19 1300  For home use only DME oxygen  Once       Question Answer Comment  Length of Need 12 Months   Mode or (Route) Nasal cannula   Liters per Minute 2   Frequency Continuous (stationary and portable oxygen unit needed)   Oxygen delivery system Gas      12/22/19 1259          Follow-up Information    Placey, MarAudrea MuscatP. Schedule an appointment as soon as possible for a visit in 2 week(s).   Contact information: [phone]     HilPixie CasinoD .   Specialty: Cardiology Contact information: [phone]     Care, [facility] up.  Why: HHPT,HHOT Contact information: [place] [address] [phone]              No Known Allergies  Consultations:  Cardiology  Procedures/Studies: DG Chest 2 View  Result Date: 12/16/2019 CLINICAL DATA:  Provided history: Swelling. Additional history provided: Patient reports abdominal swelling and redness; concern for bug bite, bilateral leg swelling and shortness of breath. EXAM: CHEST - 2 VIEW COMPARISON:  No pertinent prior studies available for comparison. FINDINGS: A metallic clothes pin projects in the region of the left hemithorax. Shallow inspiration radiograph with accentuation of heart size. There is apparent mild cardiomegaly. There is  prominence of the interstitial lung markings within the mid to lower lung fields. No definite pleural effusion. No evidence of pneumothorax. No acute bony abnormality identified. IMPRESSION: Shallow inspiration radiograph. Prominence of the interstitial lung markings within the mid to lower lung field, which may reflect atelectasis, interstitial edema or atypical/viral pneumonia. Mild cardiomegaly. A metallic clothes pin projects in the region of the left hemithorax. This is presumed external to the patient. However, clinical correlation is necessary. Electronically Signed   By: Kellie Simmering DO   On: 12/16/2019 14:07   US RENAL  Result Date: 12/18/2019 CLINICAL DATA:  Acute renal failure.  Hypertension and diabetes. EXAM: RENAL / URINARY TRACT ULTRASOUND COMPLETE COMPARISON:  None. FINDINGS: Right Kidney: Renal measurements: 9.2 x 4.5 x 5.0 cm = volume: 107 mL. Increased echogenicity of the renal parenchymal tissue. No hydronephrosis. No focal lesion. Left Kidney: Not found by sonography. Bladder: Appears normal for degree of bladder distention. Other: None. IMPRESSION: Right kidney slightly small and echogenic but without obstruction. Left kidney could not be found by the sonographer. Urine present in the bladder. Electronically Signed   By: Nelson Chimes M.D.   On: 12/18/2019 11:32   ECHOCARDIOGRAM COMPLETE  Result Date: 12/18/2019    ECHOCARDIOGRAM REPORT   Patient Name:   Joanna [name]  Date of Exam: 12/17/2019 Medical Rec #:  [id number]  Height:       59.0 in Accession #:    [id number] Weight:       318.6 lb Date of Birth:  [birth year]  BSA:          2.248 m Patient Age:    39 years   BP:           135/78 mmHg Patient Gender: F          HR:           90 bpm. Exam Location:  Inpatient Procedure: 2D Echo, Cardiac Doppler and Color Doppler Indications:    I50.33 Acute on chronic diastolic (congestive) heart failure  History:        Patient has no prior history of Echocardiogram examinations.                 Risk  Factors:Hypertension and Diabetes.  Sonographer:    Jonelle Sidle Dance Referring Phys: [id number] Athena Masse  Sonographer Comments: Patient is morbidly obese. IMPRESSIONS  1. Left ventricular ejection fraction, by estimation, is 60 to 65%. The left ventricle has normal function. The left ventricle has no regional wall motion abnormalities. There is moderate left ventricular hypertrophy. Left ventricular diastolic parameters are indeterminate.  2. Right ventricular systolic function is normal. The right ventricular size is normal. There is mildly elevated pulmonary artery systolic pressure. The estimated right ventricular systolic pressure is 66.0 mmHg.  3. Left atrial size was mild to moderately dilated.  4. Right atrial  size was mildly dilated.  5. Small pericardial effusion. The pericardial effusion is circumferential. Mild septal shudder and dilated IVC. No respiratory variation in mitral inflow, no RV diastolic collapse. No tamponade physiology noted.  6. The mitral valve is normal in structure. Trivial mitral valve regurgitation. No evidence of mitral stenosis.  7. The aortic valve is grossly normal. Aortic valve regurgitation is trivial. No aortic stenosis is present.  8. The inferior vena cava is dilated in size with <50% respiratory variability, suggesting right atrial pressure of 15 mmHg. FINDINGS  Left Ventricle: Left ventricular ejection fraction, by estimation, is 60 to 65%. The left ventricle has normal function. The left ventricle has no regional wall motion abnormalities. The left ventricular internal cavity size was normal in size. There is  moderate left ventricular hypertrophy. Left ventricular diastolic parameters are indeterminate. Right Ventricle: The right ventricular size is normal. No increase in right ventricular wall thickness. Right ventricular systolic function is normal. There is mildly elevated pulmonary artery systolic pressure. The tricuspid regurgitant velocity is 2.67  m/s, and with an  assumed right atrial pressure of 15 mmHg, the estimated right ventricular systolic pressure is 37.6 mmHg. Left Atrium: Left atrial size was mild to moderately dilated. Right Atrium: Right atrial size was mildly dilated. Pericardium: Small pericardial effusion. A small pericardial effusion is present. The pericardial effusion is circumferential. Mitral Valve: The mitral valve is normal in structure. Normal mobility of the mitral valve leaflets. Trivial mitral valve regurgitation. No evidence of mitral valve stenosis. Tricuspid Valve: The tricuspid valve is normal in structure. Tricuspid valve regurgitation is trivial. No evidence of tricuspid stenosis. Aortic Valve: The aortic valve is grossly normal. Aortic valve regurgitation is trivial. No aortic stenosis is present. Pulmonic Valve: The pulmonic valve was not well visualized. Pulmonic valve regurgitation is mild. No evidence of pulmonic stenosis. Aorta: The aortic root is normal in size and structure. Venous: The inferior vena cava is dilated in size with less than 50% respiratory variability, suggesting right atrial pressure of 15 mmHg. IAS/Shunts: The interatrial septum was not well visualized.  LEFT VENTRICLE PLAX 2D LVIDd:         4.92 cm  Diastology LVIDs:         3.40 cm  LV e' lateral:   6.20 cm/s LV PW:         1.56 cm  LV E/e' lateral: 17.7 LV IVS:        1.40 cm  LV e' medial:    6.42 cm/s LVOT diam:     2.00 cm  LV E/e' medial:  17.1 LV SV:         60 LV SV Index:   27 LVOT Area:     3.14 cm  RIGHT VENTRICLE RV Basal diam:  3.06 cm RV Mid diam:    2.65 cm RV S prime:     14.80 cm/s TAPSE (M-mode): 2.1 cm LEFT ATRIUM              Index       RIGHT ATRIUM           Index LA diam:        5.40 cm  2.40 cm/m  RA Area:     21.10 cm LA Vol (A2C):   101.0 ml 44.93 ml/m RA Volume:   60.00 ml  26.69 ml/m LA Vol (A4C):   87.7 ml  39.02 ml/m LA Biplane Vol: 96.0 ml  42.71 ml/m  AORTIC VALVE LVOT Vmax:   103.00  cm/s LVOT Vmean:  66.900 cm/s LVOT VTI:    0.190  m  AORTA Ao Root diam: 3.10 cm Ao Asc diam:  3.30 cm MITRAL VALVE                TRICUSPID VALVE MV Area (PHT): 2.83 cm     TR Peak grad:   28.5 mmHg MV Decel Time: 268 msec     TR Vmax:        267.00 cm/s MV E velocity: 109.50 cm/s                             SHUNTS                             Systemic VTI:  0.19 m                             Systemic Diam: 2.00 cm Cherlynn Kaiser MD Electronically signed by Cherlynn Kaiser MD Signature Date/Time: 12/18/2019/12:36:02 AM    Final      Subjective: Eager to go home  Discharge Exam: Vitals:   12/22/19 0906 12/22/19 1223  BP: 140/61 (!) 168/93  Pulse: 90 88  Resp:  16  Temp:  98 F (36.7 C)  SpO2: 99% 99%   Vitals:   12/21/19 2028 12/22/19 0552 12/22/19 0906 12/22/19 1223  BP: (!) 164/96 (!) 174/92 140/61 (!) 168/93  Pulse: 97 96 90 88  Resp: 18 18  16   Temp: 98.2 F (36.8 C) 97.8 F (36.6 C)  98 F (36.7 C)  TempSrc: Oral Oral    SpO2: 95% 97% 99% 99%  Weight:  130 kg    Height:        General: Pt is alert, awake, not in acute distress Cardiovascular: RRR, S1/S2 +, no rubs, no gallops Respiratory: CTA bilaterally, no wheezing, no rhonchi Abdominal: Soft, NT, ND, bowel sounds + Extremities: no edema, no cyanosis   The results of significant diagnostics from this hospitalization (including imaging, microbiology, ancillary and laboratory) are listed below for reference.     Microbiology: Recent Results (from the past 240 hour(s))  SARS Coronavirus 2 by RT PCR (hospital order, performed in [facility] hospital lab) Nasopharyngeal Nasopharyngeal Swab     Status: None   Collection Time: 12/16/19  9:32 PM   Specimen: Nasopharyngeal Swab  Result Value Ref Range Status   SARS Coronavirus 2 NEGATIVE NEGATIVE Final    Comment: (NOTE) SARS-CoV-2 target nucleic acids are NOT DETECTED.  The SARS-CoV-2 RNA is generally detectable in upper and lower respiratory specimens during the acute phase of infection. The lowest concentration  of SARS-CoV-2 viral copies this assay can detect is 250 copies / mL. A negative result does not preclude SARS-CoV-2 infection and should not be used as the sole basis for treatment or other patient management decisions.  A negative result may occur with improper specimen collection / handling, submission of specimen other than nasopharyngeal swab, presence of viral mutation(s) within the areas targeted by this assay, and inadequate number of viral copies (<250 copies / mL). A negative result must be combined with clinical observations, patient history, and epidemiological information.  Fact Sheet for Patients:   StrictlyIdeas.no  Fact Sheet for Healthcare Providers: BankingDealers.co.za  This test is not yet approved or  cleared by the [place] FDA and has been authorized for  detection and/or diagnosis of SARS-CoV-2 by FDA under an Emergency Use Authorization (EUA).  This EUA will remain in effect (meaning this test can be used) for the duration of the COVID-19 declaration under Section 564(b)(1) of the Act, 21 U.S.C. section 360bbb-3(b)(1), unless the authorization is terminated or revoked sooner.  Performed at [facility], [place] [address] [place], [place] [address]      Labs: BNP (last 3 results) Recent Labs    12/16/19 1339  BNP 7,673.4*   Basic Metabolic Panel: Recent Labs  Lab 12/18/19 0441 12/19/19 0532 12/20/19 0344 12/21/19 0147 12/22/19 0422  NA 140 138 137 138 137  K 4.8 4.3 4.3 4.0 4.3  CL 104 102 98 97* 96*  CO2 28 29 29  32 30  GLUCOSE 61* 166* 120* 136* 132*  BUN 27* 25* 26* 29* 31*  CREATININE 2.06* 1.74* 1.48* 1.69* 1.60*  CALCIUM 8.3* 8.3* 8.5* 8.6* 8.7*   Liver Function Tests: Recent Labs  Lab 12/16/19 2006 12/22/19 0422  AST 22 18  ALT 22 17  ALKPHOS 43 36*  BILITOT 0.6 0.6  PROT 5.8* 6.0*  ALBUMIN 2.4* 2.2*   No results for input(s): LIPASE, AMYLASE in the last 168 hours. No  results for input(s): AMMONIA in the last 168 hours. CBC: Recent Labs  Lab 12/16/19 1339 12/17/19 0758  WBC 6.4 6.8  HGB 10.3* 10.1*  HCT 34.4* 34.7*  MCV 86.9 87.8  PLT 355 337   Cardiac Enzymes: No results for input(s): CKTOTAL, CKMB, CKMBINDEX, TROPONINI in the last 168 hours. BNP: Invalid input(s): POCBNP CBG: Recent Labs  Lab 12/21/19 1108 12/21/19 1616 12/21/19 2128 12/22/19 0612 12/22/19 1117  GLUCAP 155* 155* 141* 117* 159*   D-Dimer No results for input(s): DDIMER in the last 72 hours. Hgb A1c No results for input(s): HGBA1C in the last 72 hours. Lipid Profile No results for input(s): CHOL, HDL, LDLCALC, TRIG, CHOLHDL, LDLDIRECT in the last 72 hours. Thyroid function studies No results for input(s): TSH, T4TOTAL, T3FREE, THYROIDAB in the last 72 hours.  Invalid input(s): FREET3 Anemia work up No results for input(s): VITAMINB12, FOLATE, FERRITIN, TIBC, IRON, RETICCTPCT in the last 72 hours. Urinalysis    Component Value Date/Time   COLORURINE STRAW (A) 12/18/2019 1155   APPEARANCEUR CLEAR 12/18/2019 1155   LABSPEC 1.005 12/18/2019 1155   PHURINE 7.0 12/18/2019 1155   GLUCOSEU NEGATIVE 12/18/2019 1155   HGBUR NEGATIVE 12/18/2019 1155   [place] 12/18/2019 1155   [place] 12/18/2019 1155   PROTEINUR NEGATIVE 12/18/2019 1155   NITRITE NEGATIVE 12/18/2019 1155   LEUKOCYTESUR NEGATIVE 12/18/2019 1155   Sepsis Labs Invalid input(s): PROCALCITONIN,  WBC,  LACTICIDVEN Microbiology Recent Results (from the past 240 hour(s))  SARS Coronavirus 2 by RT PCR (hospital order, performed in [facility] hospital lab) Nasopharyngeal Nasopharyngeal Swab     Status: None   Collection Time: 12/16/19  9:32 PM   Specimen: Nasopharyngeal Swab  Result Value Ref Range Status   SARS Coronavirus 2 NEGATIVE NEGATIVE Final    Comment: (NOTE) SARS-CoV-2 target nucleic acids are NOT DETECTED.  The SARS-CoV-2 RNA is generally detectable in upper and  lower respiratory specimens during the acute phase of infection. The lowest concentration of SARS-CoV-2 viral copies this assay can detect is 250 copies / mL. A negative result does not preclude SARS-CoV-2 infection and should not be used as the sole basis for treatment or other patient management decisions.  A negative result may occur with improper specimen collection /  handling, submission of specimen other than nasopharyngeal swab, presence of viral mutation(s) within the areas targeted by this assay, and inadequate number of viral copies (<250 copies / mL). A negative result must be combined with clinical observations, patient history, and epidemiological information.  Fact Sheet for Patients:   StrictlyIdeas.no  Fact Sheet for Healthcare Providers: BankingDealers.co.za  This test is not yet approved or  cleared by the [place] FDA and has been authorized for detection and/or diagnosis of SARS-CoV-2 by FDA under an Emergency Use Authorization (EUA).  This EUA will remain in effect (meaning this test can be used) for the duration of the COVID-19 declaration under Section 564(b)(1) of the Act, 21 U.S.C. section 360bbb-3(b)(1), unless the authorization is terminated or revoked sooner.  Performed at [facility], [place] [address], [place], [place] [address]    Time spent: 43mn  SIGNED:   SMarylu Lund MD  [facility] 12/22/2019, 5:52 PM  If 7PM-7AM, please contact night-coverage

## 2019-12-22 NOTE — Progress Notes (Signed)
SATURATION QUALIFICATIONS: (This note is used to comply with regulatory documentation for home oxygen)  Patient Saturations on Room Air at Rest = 86%  Patient Saturations on Room Air while Ambulating = NT as pt desaturates on RA at rest.   Patient Saturations on 2 Liters of oxygen while Ambulating = 93%  Please briefly explain why patient needs home oxygen:Pt needed O2 at rest and with activity to maintain sats >90%.  Tanessa Tidd W,PT Acute Rehabilitation Services Pager:  [phone]  Office:  [phone]

## 2019-12-22 NOTE — Progress Notes (Signed)
SATURATION QUALIFICATIONS: (This note is used to comply with regulatory documentation for home oxygen)  Patient Saturations on Room Air at Rest = 94%  Patient Saturations on Room Air while Ambulating = 92%    Please briefly explain why patient needs home oxygen: patient does not need oxygen

## 2019-12-22 NOTE — Care Management Important Message (Signed)
Important Message  Patient Details  Name: Joanna [name] MRN: [id number] Date of Birth: 20-Oct-1970   Medicare Important Message Given:  Yes     Shelda Altes 12/22/2019, 3:22 PM

## 2019-12-22 NOTE — Plan of Care (Signed)
?  Problem: Clinical Measurements: ?Goal: Respiratory complications will improve ?Outcome: Progressing ?  ?Problem: Elimination: ?Goal: Will not experience complications related to urinary retention ?Outcome: Progressing ?  ?Problem: Safety: ?Goal: Ability to remain free from injury will improve ?Outcome: Progressing ?  ?

## 2019-12-23 DIAGNOSIS — Z006 Encounter for examination for normal comparison and control in clinical research program: Secondary | ICD-10-CM

## 2019-12-23 NOTE — Research (Signed)
Left message with patient regarding Alleviate HF Research Study.

## 2020-01-12 ENCOUNTER — Encounter: Payer: Self-pay | Admitting: *Deleted

## 2020-01-13 ENCOUNTER — Telehealth: Payer: Self-pay | Admitting: Internal Medicine

## 2020-01-13 NOTE — Telephone Encounter (Signed)
Patient has office visit with Denyse Amass NP on Monday 01/18/20 No sooner openings at [facility] or [facility] before then  Sent to MD to advise if med changes should be made before Monday or if ED/PCP eval is necessary

## 2020-01-13 NOTE — Telephone Encounter (Signed)
Pt c/o swelling: STAT is pt has developed SOB within 24 hours  1) How much weight have you gained and in what time span? 5lbs in 1 week  2) If swelling, where is the swelling located? In her abdomen   3) Are you currently taking a fluid pill? furosemide (LASIX) 20 MG tablet  4) Are you currently SOB? when she is up cleaning, that she might have to sit down and then will get up again.  5) Do you have a log of your daily weights (if so, list)? 269 to 274  6) Have you gained 3 pounds in a day or 5 pounds in a week? Yes  7) Have you traveled recently? No   Joanna [name], patients new home health RN advised patient hasn't been eating canned veggies, not adding salt to meals, not drinking soda. Joanna [name] states she checked her BP manually on both arms, was 182/120.

## 2020-01-13 NOTE — Telephone Encounter (Signed)
Needs office evaluation   Dr Lemmie Evens

## 2020-01-13 NOTE — Telephone Encounter (Signed)
Spoke with Darlina Guys, home health RN who report 5 lb weight increase in 1 week. She report swelling in abdomen area and SOB with exertion. She also report elevated BP in both arms 180/120 (R) and 182/120 (L) but pt had not taking her BP medication.   Nurse attempted to contact pt and home caregiver to recheck BP. No answer and was unable to leave message. Nurse informed Home health RN that a message would be sent to Dr. Debara Pickett for recommendations.

## 2020-01-15 NOTE — Telephone Encounter (Signed)
° °  Darlina Guys home health nurse calling, she said pt's BP is elevated today it's at 168/110, she wanted to let Dr. Debara Pickett know she is recommending pt to go to urgent care today.  Please advise

## 2020-01-15 NOTE — Telephone Encounter (Signed)
Spoke with [place] who states she advised patient go to urgent care today for eval of swelling and elevated BP. She said when she left patient's home, patient and caregiver where getting in car to leave. Patient was advised to call our office after leaving urgent care with an update. Patient has office visit with Denyse Amass NP on 01/18/20

## 2020-01-17 NOTE — Progress Notes (Signed)
Cardiology Clinic Note   Patient Name: Joanna [name] Date of Encounter: 01/18/2020  Primary Care Provider:  Marliss Coots, NP Primary Cardiologist:  Pixie Casino, MD  Patient Profile    Joanna [name] 49 year old female presents to the clinic today for follow-up evaluation of her acute on chronic diastolic heart failure.  Past Medical History    Past Medical History:  Diagnosis Date  . Acute diastolic heart failure ([place]) 12/16/2019  . Acute respiratory failure with hypoxia ([place]) 12/16/2019  . AKI (acute kidney injury) ([place]) 12/16/2019  . Anasarca 12/16/2019  . Diabetes mellitus without complication ([place])   . HTN (hypertension) 12/16/2019  . Hypertension   . Hypertensive urgency 12/16/2019  . Intellectual disability 12/16/2019  . Morbid obesity ([place])   . New onset of congestive heart failure ([place]) 12/16/2019  . Obesity, Class III, BMI 40-49.9 (morbid obesity) ([place]) 12/16/2019  . Type 2 diabetes mellitus without complication ([place]) 8/33/8250   History reviewed. No pertinent surgical history.  Allergies  No Known Allergies  History of Present Illness    Ms. Dudziak has a PMH of diabetes, HTN, severe morbid obesity, intellectual disability/illiteracy, and diastolic CHF.  She was admitted to the hospital on 12/16/2019 and discharged on 12/22/2019.  When she presented to the emergency department she was found to be short of breath and fluid volume overloaded.  She was also suffering from hypertensive urgency and hypoxemia with saturations at 88% on room air.  She also had an elevated creatinine of 1.  7 9.  Her BUN was elevated to greater than 1300.  Her troponin was low at 31.  Her chest x-ray showed mild cardiomegaly and increased interstitial prominence.  Her renal ultrasound was performed and showed a right kidney, the left kidney could not be located suggesting possible renal agenesis.  Her echocardiogram showed an EF of 60-65%, moderate pulmonary hypertension, mild to moderate left atrial  enlargement and a small pericardial effusion.  She was diuresed with IV furosemide and had a net negative of greater than 13 L during her admission.  She was transitioned to p.o. Lasix 60 mg daily.  She was noted to have a brief episode of Wenckebach with no recurrent episodes.  She was placed on low-dose beta-blocker.  She was also noted to have hypertensive urgency there was some hesitancy to start ACE/ARB due to her recent creatinine bump and single kidney.  She was started on low-dose Coreg, Imdur and hydralazine.  She presents to the clinic today for follow-up evaluation and states she feels well. She has been exercising regularly walking three times per day for 45-60 minutes throughout the day. She states that she has been monitoring her sodium intake closely and reading labels. Her weight is down today from 286 pounds to 273 pounds. She continues to weigh daily. Her daughter indicates that her blood pressure fluctuates and has been as high as [id number] systolic at times through the day. I will add amlodipine 5 mg daily to her medication regimen, give the salty six diet sheet, and have her follow-up in 1 month for reevaluation.  Today she denies chest pain, shortness of breath, lower extremity edema, fatigue, palpitations, melena, hematuria, hemoptysis, diaphoresis, weakness, presyncope, syncope, orthopnea, and PND.   Home Medications    Prior to Admission medications   Medication Sig Start Date End Date Taking? Authorizing Provider  aspirin EC 81 MG EC tablet Take 1 tablet (81 mg total) by mouth daily. Swallow whole. 12/23/19   Donne Hazel,  MD  carvedilol (COREG) 3.125 MG tablet Take 1 tablet (3.125 mg total) by mouth 2 (two) times daily with a meal. 12/22/19 01/21/20  Donne Hazel, MD  furosemide (LASIX) 20 MG tablet Take 3 tablets (60 mg total) by mouth daily. 12/23/19 01/22/20  Donne Hazel, MD  glimepiride (AMARYL) 2 MG tablet Take 2 mg by mouth daily with breakfast.    [provider]  hydrALAZINE (APRESOLINE) 50 MG tablet Take 1 tablet (50 mg total) by mouth every 8 (eight) hours. 12/22/19 01/21/20  Donne Hazel, MD  isosorbide mononitrate (IMDUR) 120 MG 24 hr tablet Take 1 tablet (120 mg total) by mouth daily. 12/23/19 01/22/20  Donne Hazel, MD  metformin (FORTAMET) 500 MG (OSM) 24 hr tablet Take 500 mg by mouth daily with breakfast.    [provider]    Family History    Family History  Problem Relation Age of Onset  . Heart disease Mother        Enlarged heart per patient  . Hypertension Mother   . Diabetes Mother   . Hypertension Sister   . Diabetes Sister    She indicated that the status of her mother is unknown. She indicated that the status of her sister is unknown.  Social History    Social History   Socioeconomic History  . Marital status: Single    Spouse name: Not on file  . Number of children: Not on file  . Years of education: Not on file  . Highest education level: Not on file  Occupational History  . Not on file  Tobacco Use  . Smoking status: Never Smoker  . Smokeless tobacco: Never Used  Substance and Sexual Activity  . Alcohol use: Not Currently  . Drug use: Not Currently  . Sexual activity: Not on file  Other Topics Concern  . Not on file  Social History Narrative  . Not on file   Social Determinants of Health   Financial Resource Strain:   . Difficulty of Paying Living Expenses:   Food Insecurity:   . Worried About Charity fundraiser in the Last Year:   . Arboriculturist in the Last Year:   Transportation Needs:   . Film/video editor (Medical):   Marland Kitchen Lack of Transportation (Non-Medical):   Physical Activity:   . Days of Exercise per Week:   . Minutes of Exercise per Session:   Stress:   . Feeling of Stress :   Social Connections:   . Frequency of Communication with Friends and Family:   . Frequency of Social Gatherings with Friends and Family:   . Attends Religious Services:   . Active Member of  Clubs or Organizations:   . Attends Archivist Meetings:   Marland Kitchen Marital Status:   Intimate Partner Violence:   . Fear of Current or Ex-Partner:   . Emotionally Abused:   Marland Kitchen Physically Abused:   . Sexually Abused:      Review of Systems    General:  No chills, fever, night sweats or weight changes.  Cardiovascular:  No chest pain, dyspnea on exertion, edema, orthopnea, palpitations, paroxysmal nocturnal dyspnea. Dermatological: No rash, lesions/masses Respiratory: No cough, dyspnea Urologic: No hematuria, dysuria Abdominal:   No nausea, vomiting, diarrhea, bright red blood per rectum, melena, or hematemesis Neurologic:  No visual changes, wkns, changes in mental status. All other systems reviewed and are otherwise negative except as noted above.  Physical Exam  VS:  BP (!) 135/72   Pulse 85   Ht 5' 2"  (1.575 m)   Wt (!) 273 lb (123.8 kg)   SpO2 94%   BMI 49.93 kg/m  , BMI Body mass index is 49.93 kg/m. GEN: Well nourished, well developed, in no acute distress. HEENT: normal. Neck: Supple, no JVD, carotid bruits, or masses. Cardiac: RRR, no murmurs, rubs, or gallops. No clubbing, cyanosis, generalized lower extremity nonpitting edema.  Radials/DP/PT 2+ and equal bilaterally.  Respiratory:  Respirations regular and unlabored, clear to auscultation bilaterally. GI: Soft, nontender, nondistended, BS + x 4. MS: no deformity or atrophy. Skin: warm and dry, no rash. Neuro:  Strength and sensation are intact. Psych: Normal affect.  Accessory Clinical Findings    Recent Labs: 12/16/2019: B Natriuretic Peptide 1,325.4 12/17/2019: Hemoglobin 10.1; Platelets 337; TSH 2.496 12/22/2019: ALT 17; BUN 31; Creatinine, Ser 1.60; Potassium 4.3; Sodium 137   Recent Lipid Panel No results found for: CHOL, TRIG, HDL, CHOLHDL, VLDL, LDLCALC, LDLDIRECT  ECG personally reviewed by me today-none today. EKG 12/18/2019 Normal sinus rhythm 98 bpm  Echocardiogram 12/17/2019  IMPRESSIONS     1. Left ventricular ejection fraction, by estimation, is 60 to 65%. The  left ventricle has normal function. The left ventricle has no regional  wall motion abnormalities. There is moderate left ventricular hypertrophy.  Left ventricular diastolic  parameters are indeterminate.  2. Right ventricular systolic function is normal. The right ventricular  size is normal. There is mildly elevated pulmonary artery systolic  pressure. The estimated right ventricular systolic pressure is 81.0 mmHg.  3. Left atrial size was mild to moderately dilated.  4. Right atrial size was mildly dilated.  5. Small pericardial effusion. The pericardial effusion is  circumferential. Mild septal shudder and dilated IVC. No respiratory  variation in mitral inflow, no RV diastolic collapse. No tamponade  physiology noted.  6. The mitral valve is normal in structure. Trivial mitral valve  regurgitation. No evidence of mitral stenosis.  7. The aortic valve is grossly normal. Aortic valve regurgitation is  trivial. No aortic stenosis is present.  8. The inferior vena cava is dilated in size with <50% respiratory  variability, suggesting right atrial pressure of 15 mmHg.   Assessment & Plan   1.  Acute on chronic diastolic heart failure-generalized bilateral lower extremity nonpitting edema. Echocardiogram 12/17/2019 showed LVEF of 60-65% and intermediate diastolic parameters. Has been exercising regularly 45-60 minutes daily. Continue furosemide, carvedilol, hydralazine, Imdur Heart healthy low-sodium diet-salty 6 given Increase physical activity as tolerated Order BMP  Essential hypertension/Hx of hypertensive urgency -BP today 135/72.  Elevated at home 175'Z systolic at times.  Continue carvedilol, furosemide, hydralazine, Imdur Heart healthy low-sodium diet-salty 6 given Increase physical activity as tolerated Start amlodipine 5 mg daily BP log  CKD III3-creatinine 1.6 on 12/22/2019 Followed by  PCP  Morbid obesity-weight today 273 down from 286.6 lbs   Continue weight loss Heart healthy low-sodium diet Increase physical activity as tolerated  Disposition: Follow-up with me in 1 months.  Jossie Ng. Josecarlos Harriott NP-C    01/18/2020, 9:26 AM [address] [place] [address] Office [phone] Fax [phone]  Notice: This dictation was prepared with Dragon dictation along with smaller phrase technology. Any transcriptional errors that result from this process are unintentional and may not be corrected upon review.

## 2020-01-18 ENCOUNTER — Encounter: Payer: Self-pay | Admitting: General Practice

## 2020-01-18 ENCOUNTER — Other Ambulatory Visit: Payer: Self-pay

## 2020-01-18 ENCOUNTER — Ambulatory Visit (INDEPENDENT_AMBULATORY_CARE_PROVIDER_SITE_OTHER): Payer: Medicare Other | Admitting: General Practice

## 2020-01-18 VITALS — BP 135/72 | HR 85 | Ht 62.0 in | Wt 273.0 lb

## 2020-01-18 DIAGNOSIS — I5033 Acute on chronic diastolic (congestive) heart failure: Secondary | ICD-10-CM

## 2020-01-18 DIAGNOSIS — N1831 Chronic kidney disease, stage 3a: Secondary | ICD-10-CM

## 2020-01-18 DIAGNOSIS — I10 Essential (primary) hypertension: Secondary | ICD-10-CM | POA: Diagnosis not present

## 2020-01-18 LAB — BASIC METABOLIC PANEL
BUN/Creatinine Ratio: 14 (ref 9–23)
BUN: 25 mg/dL — ABNORMAL HIGH (ref 6–24)
CO2: 24 mmol/L (ref 20–29)
Calcium: 8.8 mg/dL (ref 8.7–10.2)
Chloride: 103 mmol/L (ref 96–106)
Creatinine, Ser: 1.76 mg/dL — ABNORMAL HIGH (ref 0.57–1.00)
GFR calc Af Amer: 39 mL/min/{1.73_m2} — ABNORMAL LOW (ref >59)
GFR calc non Af Amer: 34 mL/min/{1.73_m2} — ABNORMAL LOW (ref >59)
Glucose: 104 mg/dL — ABNORMAL HIGH (ref 65–99)
Potassium: 4.5 mmol/L (ref 3.5–5.2)
Sodium: 140 mmol/L (ref 134–144)

## 2020-01-18 MED ORDER — AMLODIPINE BESYLATE 5 MG PO TABS
5.0000 mg | ORAL_TABLET | Freq: Every day | ORAL | 3 refills | Status: DC
Start: 2020-01-18 — End: 2020-04-12

## 2020-01-18 NOTE — Patient Instructions (Addendum)
Medication Instructions:  Start Amlodipine 5 mg Daily *If you need a refill on your cardiac medications before your next appointment, please call your pharmacy*   Lab Work: BMP today If you have labs (blood work) drawn today and your tests are completely normal, you will receive your results only by: Marland Kitchen MyChart Message (if you have MyChart) OR . A paper copy in the mail If you have any lab test that is abnormal or we need to change your treatment, we will call you to review the results.   Testing/Procedures: None    Follow-Up: At [facility], you and your health needs are our priority.  As part of our continuing mission to provide you with exceptional heart care, we have created designated Provider Care Teams.  These Care Teams include your primary Cardiologist (physician) and Advanced Practice Providers (APPs -  Physician Assistants and Nurse Practitioners) who all work together to provide you with the care you need, when you need it.  We recommend signing up for the patient portal called "MyChart".  Sign up information is provided on this After Visit Summary.  MyChart is used to connect with patients for Virtual Visits (Telemedicine).  Patients are able to view lab/test results, encounter notes, upcoming appointments, etc.  Non-urgent messages can be sent to your provider as well.   To learn more about what you can do with MyChart, go to [email or web address].    Your next appointment:   1 month(s)  The format for your next appointment:   In Person  Provider:   You may see Pixie Casino, MD , Coletta Memos or one of the following Advanced Practice Providers on your designated Care Team:    Almyra Deforest, PA-C  Fabian Sharp, [place] or   Roby Lofts, [place]     Other Instructions Keep Daily log of Blood Pressure. [place] Information Sheet

## 2020-01-25 ENCOUNTER — Telehealth: Payer: Self-pay | Admitting: General Practice

## 2020-01-25 NOTE — Telephone Encounter (Signed)
    Joanna [name] returned call to get pt's lab results

## 2020-02-17 ENCOUNTER — Other Ambulatory Visit: Payer: Self-pay

## 2020-02-17 DIAGNOSIS — I5033 Acute on chronic diastolic (congestive) heart failure: Secondary | ICD-10-CM

## 2020-02-17 DIAGNOSIS — Z79899 Other long term (current) drug therapy: Secondary | ICD-10-CM

## 2020-02-17 MED ORDER — FUROSEMIDE 20 MG PO TABS: 40.0000 mg | ORAL_TABLET | Freq: Every day | ORAL | 0 refills | Status: DC

## 2020-02-19 ENCOUNTER — Ambulatory Visit: Payer: Medicare Other | Admitting: Internal Medicine

## 2020-04-11 ENCOUNTER — Other Ambulatory Visit: Payer: Self-pay | Admitting: Internal Medicine

## 2020-04-11 NOTE — Telephone Encounter (Signed)
*  STAT* If patient is at the pharmacy, call can be transferred to refill team.   1. Which medications need to be refilled? (please list name of each medication and dose if known) amlodipine 5 mg  2. Which pharmacy/location (including street and city if local pharmacy) is medication to be sent to? [facility]   3. Do they need a 30 day or 90 day supply? 30 not sure

## 2020-04-12 MED ORDER — AMLODIPINE BESYLATE 5 MG PO TABS: 5.0000 mg | ORAL_TABLET | Freq: Every day | ORAL | 3 refills | Status: DC

## 2020-04-12 NOTE — Telephone Encounter (Signed)
Rx(s) sent to pharmacy electronically.  

## 2020-05-11 ENCOUNTER — Emergency Department (HOSPITAL_COMMUNITY)
Admission: EM | Admit: 2020-05-11 | Discharge: 2020-05-11 | Disposition: A | Discharge status: Home / Self Care | Payer: Medicare Other | Attending: Emergency Medicine | Admitting: Emergency Medicine

## 2020-05-11 ENCOUNTER — Emergency Department (HOSPITAL_COMMUNITY): Payer: Medicare Other

## 2020-05-11 ENCOUNTER — Other Ambulatory Visit: Payer: Self-pay

## 2020-05-11 DIAGNOSIS — I110 Hypertensive heart disease with heart failure: Secondary | ICD-10-CM | POA: Insufficient documentation

## 2020-05-11 DIAGNOSIS — Z7982 Long term (current) use of aspirin: Secondary | ICD-10-CM | POA: Insufficient documentation

## 2020-05-11 DIAGNOSIS — M25571 Pain in right ankle and joints of right foot: Secondary | ICD-10-CM | POA: Diagnosis not present

## 2020-05-11 DIAGNOSIS — Z7984 Long term (current) use of oral hypoglycemic drugs: Secondary | ICD-10-CM | POA: Insufficient documentation

## 2020-05-11 DIAGNOSIS — Z79899 Other long term (current) drug therapy: Secondary | ICD-10-CM | POA: Insufficient documentation

## 2020-05-11 DIAGNOSIS — I5031 Acute diastolic (congestive) heart failure: Secondary | ICD-10-CM | POA: Insufficient documentation

## 2020-05-11 DIAGNOSIS — M79674 Pain in right toe(s): Secondary | ICD-10-CM

## 2020-05-11 DIAGNOSIS — E119 Type 2 diabetes mellitus without complications: Secondary | ICD-10-CM | POA: Insufficient documentation

## 2020-05-11 DIAGNOSIS — M79661 Pain in right lower leg: Secondary | ICD-10-CM | POA: Diagnosis present

## 2020-05-11 MED ORDER — ACETAMINOPHEN 325 MG PO TABS
650.0000 mg | ORAL_TABLET | Freq: Once | ORAL | Status: AC
  Administered 2020-05-11: 650 mg via ORAL
  Filled 2020-05-11: qty 2

## 2020-05-11 MED ORDER — HYDROCODONE-ACETAMINOPHEN 5-325 MG PO TABS
1.0000 | ORAL_TABLET | Freq: Four times a day (QID) | ORAL | 0 refills | Status: DC | PRN
Start: 2020-05-11 — End: 2023-02-03

## 2020-05-11 NOTE — Discharge Instructions (Addendum)
The x-rays of your ankle and great toe were without any abnormalities.  You may very well have gout or disease called pseudogout.  Please take Tylenol for this.  As he has a history of heart failure and kidney disease I recommend not taking ibuprofen or other NSAIDs all these are typically helpful with his disease.  As we discussed, even after assessing your blood flow which is equal on both your painful leg and nonpainful leg I do believe it is very important to monitor your symptoms return emergency department for any new or concerning 1 specifically he have any worsening of the pain, if your leg becomes cold, numb or white.  Please follow-up with your primary care doctor.  I prescribed you some pain medicine please use Tylenol 1000 mg every 6 hours as needed for pain you may also use the medication sent to the pharmacy for you if you take this please do not use Tylenol for 1 dose.  Pushing plenty of water.

## 2020-05-11 NOTE — ED Triage Notes (Signed)
C/o right foot pain d/t gout x 3 days.

## 2020-05-11 NOTE — ED Provider Notes (Signed)
[facility] EMERGENCY DEPARTMENT Provider Note   CSN: [id number] Arrival date & time: 05/11/20  1347     History Chief Complaint  Patient presents with  . Foot Pain    Joanna [name] is a 49 y.o. female.  HPI Patient is a 49 year old female with a history of heart failure, DM, HTN, obesity  She is presenting today with 3 days of right lower migratory pain.  She states that she has had no significant injuries to the area.  She states it is achy sharp with touch she states that it feels without any pain when she has her foot elevated and is not touching.  She states that it feels swollen but she is unable to detect any swelling visually.  She denies any fevers or chills.  Denies any nausea, vomiting, chest pain, shortness of breath.  She denies any calf swelling or pain.  Denies any history of blood clots.  She states she is able to walk without it is painful.  She denies any other associated symptoms.  No aggravating or mitigating factors.  She has taken no medications prior to arrival.  She is concerned about gout although she has never been diagnosed with gout before.      Past Medical History:  Diagnosis Date  . Acute diastolic heart failure ([place]) 12/16/2019  . Acute respiratory failure with hypoxia ([place]) 12/16/2019  . AKI (acute kidney injury) ([place]) 12/16/2019  . Anasarca 12/16/2019  . Diabetes mellitus without complication ([place])   . HTN (hypertension) 12/16/2019  . Hypertension   . Hypertensive urgency 12/16/2019  . Intellectual disability 12/16/2019  . Morbid obesity ([place])   . New onset of congestive heart failure ([place]) 12/16/2019  . Obesity, Class III, BMI 40-49.9 (morbid obesity) ([place]) 12/16/2019  . Type 2 diabetes mellitus without complication ([place]) 09/24/8117    Patient Active Problem List   Diagnosis Date Noted  . HTN (hypertension) 12/16/2019  . Type 2 diabetes mellitus without complication ([place]) 14/78/2956  . Anasarca 12/16/2019  . AKI (acute kidney  injury) ([place]) 12/16/2019  . New onset of congestive heart failure ([place]) 12/16/2019  . Obesity, Class III, BMI 40-49.9 (morbid obesity) ([place]) 12/16/2019  . Depression 12/16/2019  . Intellectual disability 12/16/2019  . Acute diastolic heart failure ([place]) 12/16/2019  . Acute respiratory failure with hypoxia ([place]) 12/16/2019  . Hypertensive urgency 12/16/2019    No past surgical history on file.   OB History   No obstetric history on file.     Family History  Problem Relation Age of Onset  . Heart disease Mother        Enlarged heart per patient  . Hypertension Mother   . Diabetes Mother   . Hypertension Sister   . Diabetes Sister     Social History   Tobacco Use  . Smoking status: Never Smoker  . Smokeless tobacco: Never Used  Substance Use Topics  . Alcohol use: Not Currently  . Drug use: Not Currently    Home Medications Prior to Admission medications   Medication Sig Start Date End Date Taking? Authorizing Provider  amLODipine (NORVASC) 5 MG tablet Take 1 tablet (5 mg total) by mouth daily. Patient taking differently: Take 5 mg by mouth See admin instructions. Take two tablets by mouth each morning and take one tablet by mouth every evening 04/12/20 07/11/20 Yes Hilty, Nadean Corwin, MD  carvedilol (COREG) 3.125 MG tablet Take 1 tablet (3.125 mg total) by mouth 2 (two) times daily with a meal.  Patient taking differently: Take 3.125 mg by mouth in the morning and at bedtime.  12/22/19 05/11/20 Yes Donne Hazel, MD  glimepiride (AMARYL) 2 MG tablet Take 2 mg by mouth daily.    Yes [provider]  hydrochlorothiazide (HYDRODIURIL) 25 MG tablet Take 25 mg by mouth daily. 02/02/20  Yes [provider]  metformin (FORTAMET) 500 MG (OSM) 24 hr tablet Take 500 mg by mouth in the morning and at bedtime.    Yes [provider]  aspirin EC 81 MG EC tablet Take 1 tablet (81 mg total) by mouth daily. Swallow whole. Patient not taking: Reported on 05/11/2020  12/23/19   Donne Hazel, MD  furosemide (LASIX) 20 MG tablet Take 2 tablets (40 mg total) by mouth daily. Patient not taking: Reported on 05/11/2020 02/17/20 03/18/20  Deberah Pelton, NP  hydrALAZINE (APRESOLINE) 50 MG tablet Take 1 tablet (50 mg total) by mouth every 8 (eight) hours. Patient not taking: Reported on 05/11/2020 12/22/19 01/21/20  Donne Hazel, MD  HYDROcodone-acetaminophen (NORCO/VICODIN) 5-325 MG tablet Take 1 tablet by mouth every 6 (six) hours as needed. 05/11/20   Tedd Sias, PA  isosorbide mononitrate (IMDUR) 120 MG 24 hr tablet Take 1 tablet (120 mg total) by mouth daily. Patient not taking: Reported on 05/11/2020 12/23/19 01/22/20  Donne Hazel, MD    Allergies    Patient has no known allergies.  Review of Systems   Review of Systems  Constitutional: Negative for fever.  HENT: Negative for congestion.   Respiratory: Negative for shortness of breath.   Cardiovascular: Negative for chest pain.  Gastrointestinal: Negative for abdominal distention.  Musculoskeletal:       Right foot and ankle pain  Neurological: Negative for dizziness and headaches.    Physical Exam Updated Vital Signs BP (!) 157/86 (BP Location: Right Arm)   Pulse 84   Temp 98.4 F (36.9 C) (Oral)   Resp 18   SpO2 100%   Physical Exam Vitals and nursing note reviewed.  Constitutional:      General: She is not in acute distress.    Appearance: Normal appearance. She is obese. She is not ill-appearing.     Comments: Morbidly obese 49 year old female in no acute distress sitting comfortably in bed.  She is asleep on my arrival.  She awakens to voice and was able answer questions appropriately and follow commands.  HENT:     Head: Normocephalic and atraumatic.  Eyes:     General: No scleral icterus.       Right eye: No discharge.        Left eye: No discharge.     Conjunctiva/sclera: Conjunctivae normal.  Cardiovascular:     Rate and Rhythm: Normal rate.     Comments: DP and PT  pulses confirmed with Doppler biphasic pulse in DP and PT bilaterally.  Approximately symmetric. Pulmonary:     Effort: Pulmonary effort is normal.     Breath sounds: No stridor.  Musculoskeletal:     Right lower leg: No edema.     Left lower leg: No edema.     Comments: Tenderness to palpation of the right medial malleolus of the ankle as well as the right great toe at the M TP.  There is no obvious swelling or redness.  There is tenderness to touch.  There is full range of motion.  Skin:    General: Skin is warm and dry.     Capillary Refill: Capillary refill  takes less than 2 seconds.  Neurological:     Mental Status: She is alert and oriented to person, place, and time. Mental status is at baseline.  Psychiatric:        Mood and Affect: Mood normal.        Behavior: Behavior normal.     ED Results / Procedures / Treatments   Labs (all labs ordered are listed, but only abnormal results are displayed) Labs Reviewed - No data to display  EKG None  Radiology DG Ankle Complete Right  Result Date: 05/11/2020 CLINICAL DATA:  Atraumatic right foot and ankle pain EXAM: RIGHT FOOT COMPLETE - 3+ VIEW; RIGHT ANKLE - COMPLETE 3+ VIEW COMPARISON:  None. FINDINGS: There is no evidence of fracture or dislocation. Ankle mortise is congruent. There is no evidence of arthropathy or other focal bone abnormality. No focal erosions. Soft tissues are unremarkable. IMPRESSION: Negative. Electronically Signed   By: Davina Poke D.O.   On: 05/11/2020 16:45   DG Foot Complete Right  Result Date: 05/11/2020 CLINICAL DATA:  Atraumatic right foot and ankle pain EXAM: RIGHT FOOT COMPLETE - 3+ VIEW; RIGHT ANKLE - COMPLETE 3+ VIEW COMPARISON:  None. FINDINGS: There is no evidence of fracture or dislocation. Ankle mortise is congruent. There is no evidence of arthropathy or other focal bone abnormality. No focal erosions. Soft tissues are unremarkable. IMPRESSION: Negative. Electronically Signed   By:  Davina Poke D.O.   On: 05/11/2020 16:45    Procedures Procedures (including critical care time)  Medications Ordered in ED Medications  acetaminophen (TYLENOL) tablet 650 mg (650 mg Oral Given 05/11/20 1617)    ED Course  I have reviewed the triage vital signs and the nursing notes.  Pertinent labs & imaging results that were available during my care of the patient were reviewed by me and considered in my medical decision making (see chart for details).    MDM Rules/Calculators/A&P                          Patient is 49 year old female past medical history detailed blood presented today for pain of right great toe and pain of right ankle.  She has full range motion good cap refill and good pulses.  She has no bony deformity but does have significant tenderness.  Will obtain x-rays to evaluate for fracture or crystalline arthropathy.  This is my high suspicion however because of her chronic medical issues did confirm her palpable pulses with Doppler which confirmed biphasic flow bilateral lower extremities which is approximately symmetric.  I have very low suspicion of venous or arterial disease causing her symptoms today higher suspicion for crystalline arthropathy   She is given strict return precautions.  Tylenol and because of her chronic disease given short course of 6 tablets of Norco for pain control at bedtime.  Her presentation description is consistent with gout. Very low suspicion for septic arthritis.  Patient understands return precautions and treatment she will follow up with her PCP.  X-rays reviewed by myself show no acute abnormality of the right great toe or the medial malleolus where tenderness is present.  Final Clinical Impression(s) / ED Diagnoses Final diagnoses:  Pain of right great toe  Acute right ankle pain    Rx / DC Orders ED Discharge Orders         Ordered    HYDROcodone-acetaminophen (NORCO/VICODIN) 5-325 MG tablet  Every 6 hours PRN  05/11/20 Alden, Savalas Monje S, [place] 05/11/20 1715    Blanchie Dessert, MD 05/11/20 1741

## 2020-06-16 ENCOUNTER — Encounter: Payer: Medicare Other | Admitting: Physician Assistant

## 2020-06-17 NOTE — Progress Notes (Signed)
This encounter was created in error - please disregard.

## 2020-09-20 ENCOUNTER — Ambulatory Visit: Payer: 59 | Admitting: Podiatry

## 2020-10-26 ENCOUNTER — Ambulatory Visit: Payer: 59 | Admitting: Podiatry

## 2020-11-16 ENCOUNTER — Other Ambulatory Visit: Payer: Self-pay

## 2020-11-16 ENCOUNTER — Ambulatory Visit (INDEPENDENT_AMBULATORY_CARE_PROVIDER_SITE_OTHER): Payer: 59 | Admitting: Podiatry

## 2020-11-16 ENCOUNTER — Encounter: Payer: Self-pay | Admitting: Podiatry

## 2020-11-16 DIAGNOSIS — M79675 Pain in left toe(s): Secondary | ICD-10-CM | POA: Diagnosis not present

## 2020-11-16 DIAGNOSIS — M79674 Pain in right toe(s): Secondary | ICD-10-CM

## 2020-11-16 DIAGNOSIS — B351 Tinea unguium: Secondary | ICD-10-CM

## 2020-11-16 DIAGNOSIS — N179 Acute kidney failure, unspecified: Secondary | ICD-10-CM | POA: Diagnosis not present

## 2020-11-16 DIAGNOSIS — E119 Type 2 diabetes mellitus without complications: Secondary | ICD-10-CM | POA: Diagnosis not present

## 2020-11-16 NOTE — Progress Notes (Signed)
This patient returns to my office for at risk foot care.  This patient requires this care by a professional since this patient will be at risk due to having type 2 diabetes and acute kidney disease.   This patient is unable to cut nails herself since the patient cannot reach her nails.These nails are painful walking and wearing shoes.  This patient presents for at risk foot care today.  Patient presents to the office with her mother.  General Appearance  Alert, conversant and in no acute stress.  Vascular  Dorsalis pedis and posterior tibial  pulses are palpable  bilaterally.  Capillary return is within normal limits  bilaterally. Temperature is within normal limits  bilaterally.  Neurologic  Senn-Weinstein monofilament wire test within normal limits  bilaterally. Muscle power within normal limits bilaterally.  Nails Thick disfigured discolored nails with subungual debris  from hallux to fifth toes bilaterally. No evidence of bacterial infection or drainage bilaterally.  Orthopedic  No limitations of motion  feet .  No crepitus or effusions noted.  No bony pathology or digital deformities noted.  Skin  normotropic skin with no porokeratosis noted bilaterally.  No signs of infections or ulcers noted.     Onychomycosis  Pain in right toes  Pain in left toes  Consent was obtained for treatment procedures.   Mechanical debridement of nails 1-5  bilaterally performed with a nail nipper.  Filed with dremel without incident.    Return office visit 3 months                     Told patient to return for periodic foot care and evaluation due to potential at risk complications.   Gardiner Barefoot DPM

## 2021-04-17 ENCOUNTER — Other Ambulatory Visit: Payer: Self-pay

## 2021-04-17 ENCOUNTER — Encounter: Payer: Self-pay | Admitting: Podiatry

## 2021-04-17 ENCOUNTER — Ambulatory Visit (INDEPENDENT_AMBULATORY_CARE_PROVIDER_SITE_OTHER): Payer: Medicare Other | Admitting: Podiatry

## 2021-04-17 DIAGNOSIS — M79674 Pain in right toe(s): Secondary | ICD-10-CM

## 2021-04-17 DIAGNOSIS — N179 Acute kidney failure, unspecified: Secondary | ICD-10-CM | POA: Diagnosis not present

## 2021-04-17 DIAGNOSIS — M79675 Pain in left toe(s): Secondary | ICD-10-CM

## 2021-04-17 DIAGNOSIS — E119 Type 2 diabetes mellitus without complications: Secondary | ICD-10-CM

## 2021-04-17 DIAGNOSIS — B351 Tinea unguium: Secondary | ICD-10-CM

## 2021-04-17 NOTE — Progress Notes (Signed)
This patient returns to my office for at risk foot care.  This patient requires this care by a professional since this patient will be at risk due to having type 2 diabetes and acute kidney disease.   This patient is unable to cut nails herself since the patient cannot reach her nails.These nails are painful walking and wearing shoes.  This patient presents for at risk foot care today.  Patient presents to the office with her mother.  General Appearance  Alert, conversant and in no acute stress.  Vascular  Dorsalis pedis and posterior tibial  pulses are palpable  bilaterally.  Capillary return is within normal limits  bilaterally. Temperature is within normal limits  bilaterally.  Neurologic  Senn-Weinstein monofilament wire test within normal limits  bilaterally. Muscle power within normal limits bilaterally.  Nails Thick disfigured discolored nails with subungual debris  from hallux to fifth toes bilaterally. No evidence of bacterial infection or drainage bilaterally.  Orthopedic  No limitations of motion  feet .  No crepitus or effusions noted.  No bony pathology or digital deformities noted.  Skin  normotropic skin with no porokeratosis noted bilaterally.  No signs of infections or ulcers noted.     Onychomycosis  Pain in right toes  Pain in left toes  Consent was obtained for treatment procedures.   Mechanical debridement of nails 1-5  bilaterally performed with a nail nipper.  Filed with dremel without incident.    Return office visit 4  months                     Told patient to return for periodic foot care and evaluation due to potential at risk complications.   Gardiner Barefoot DPM

## 2021-05-09 IMAGING — CR DG ANKLE COMPLETE 3+V*R*
3 series · 3 of 3 positions shown · non-contrast
Comparison: None.

CLINICAL DATA: Atraumatic right foot and ankle pain

EXAM:
RIGHT FOOT COMPLETE - 3+ VIEW; RIGHT ANKLE - COMPLETE 3+ VIEW

[ankle ap]
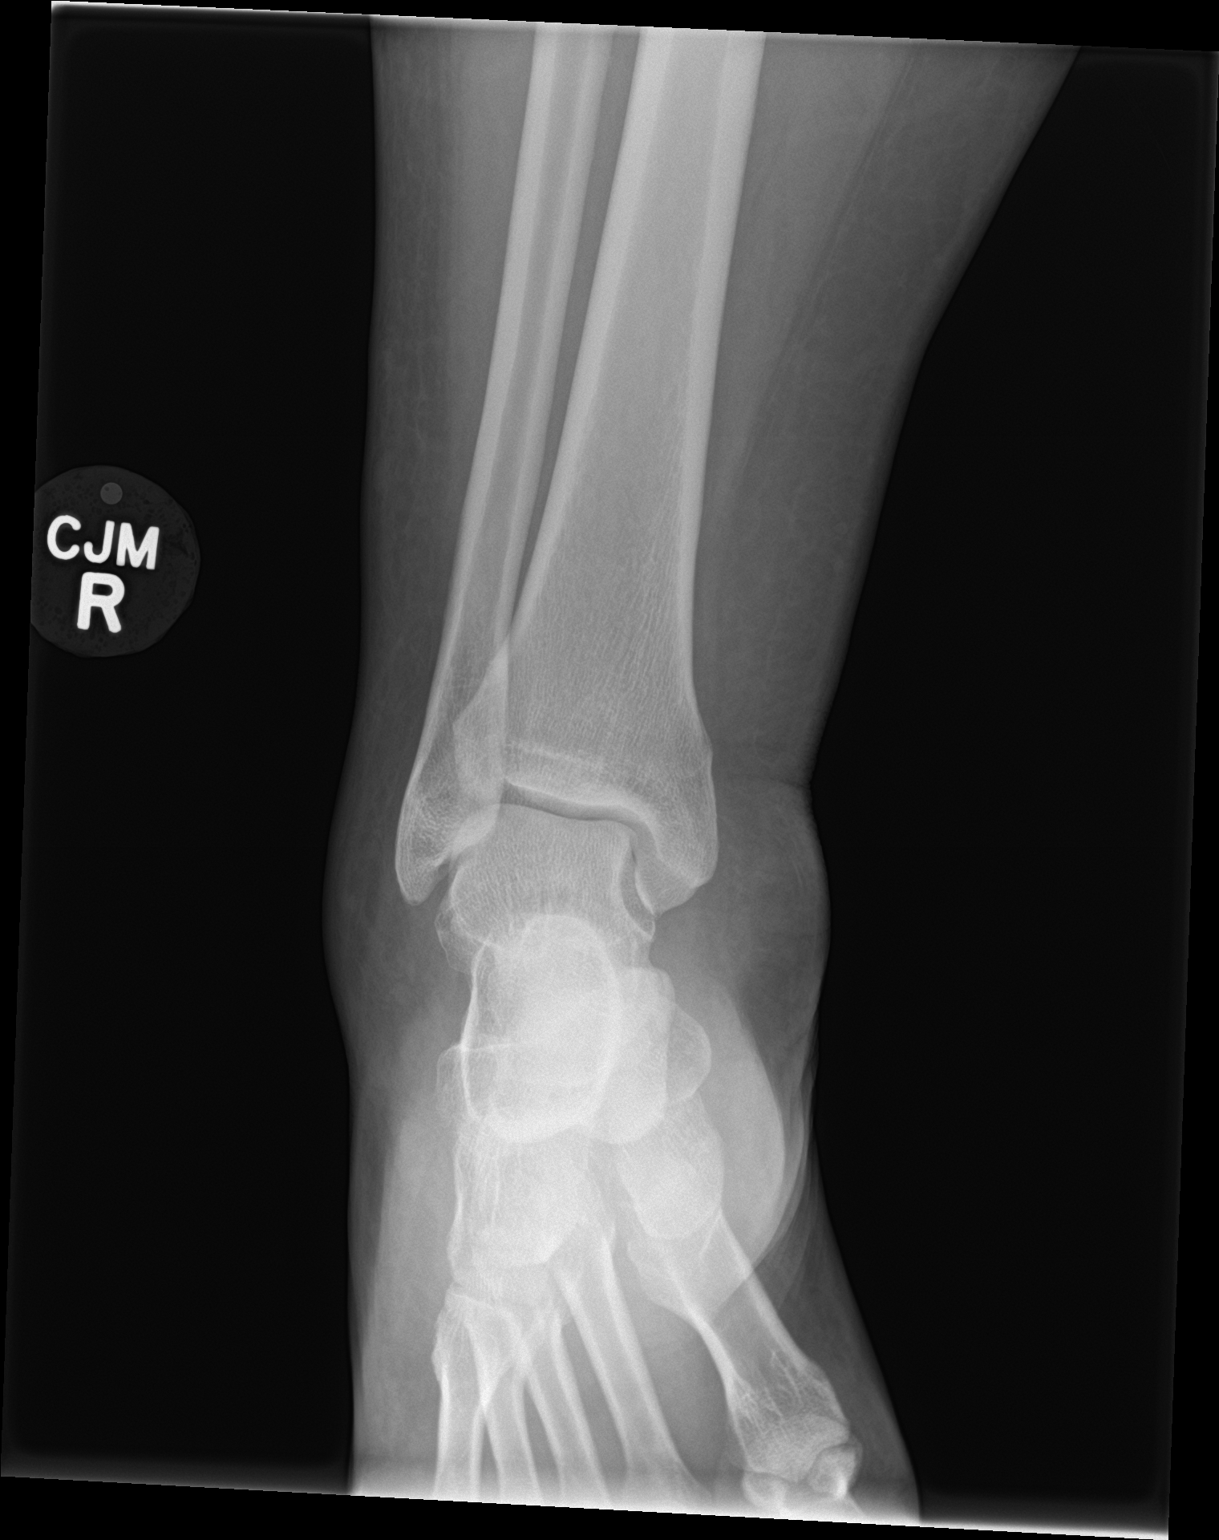

[ankle obl]
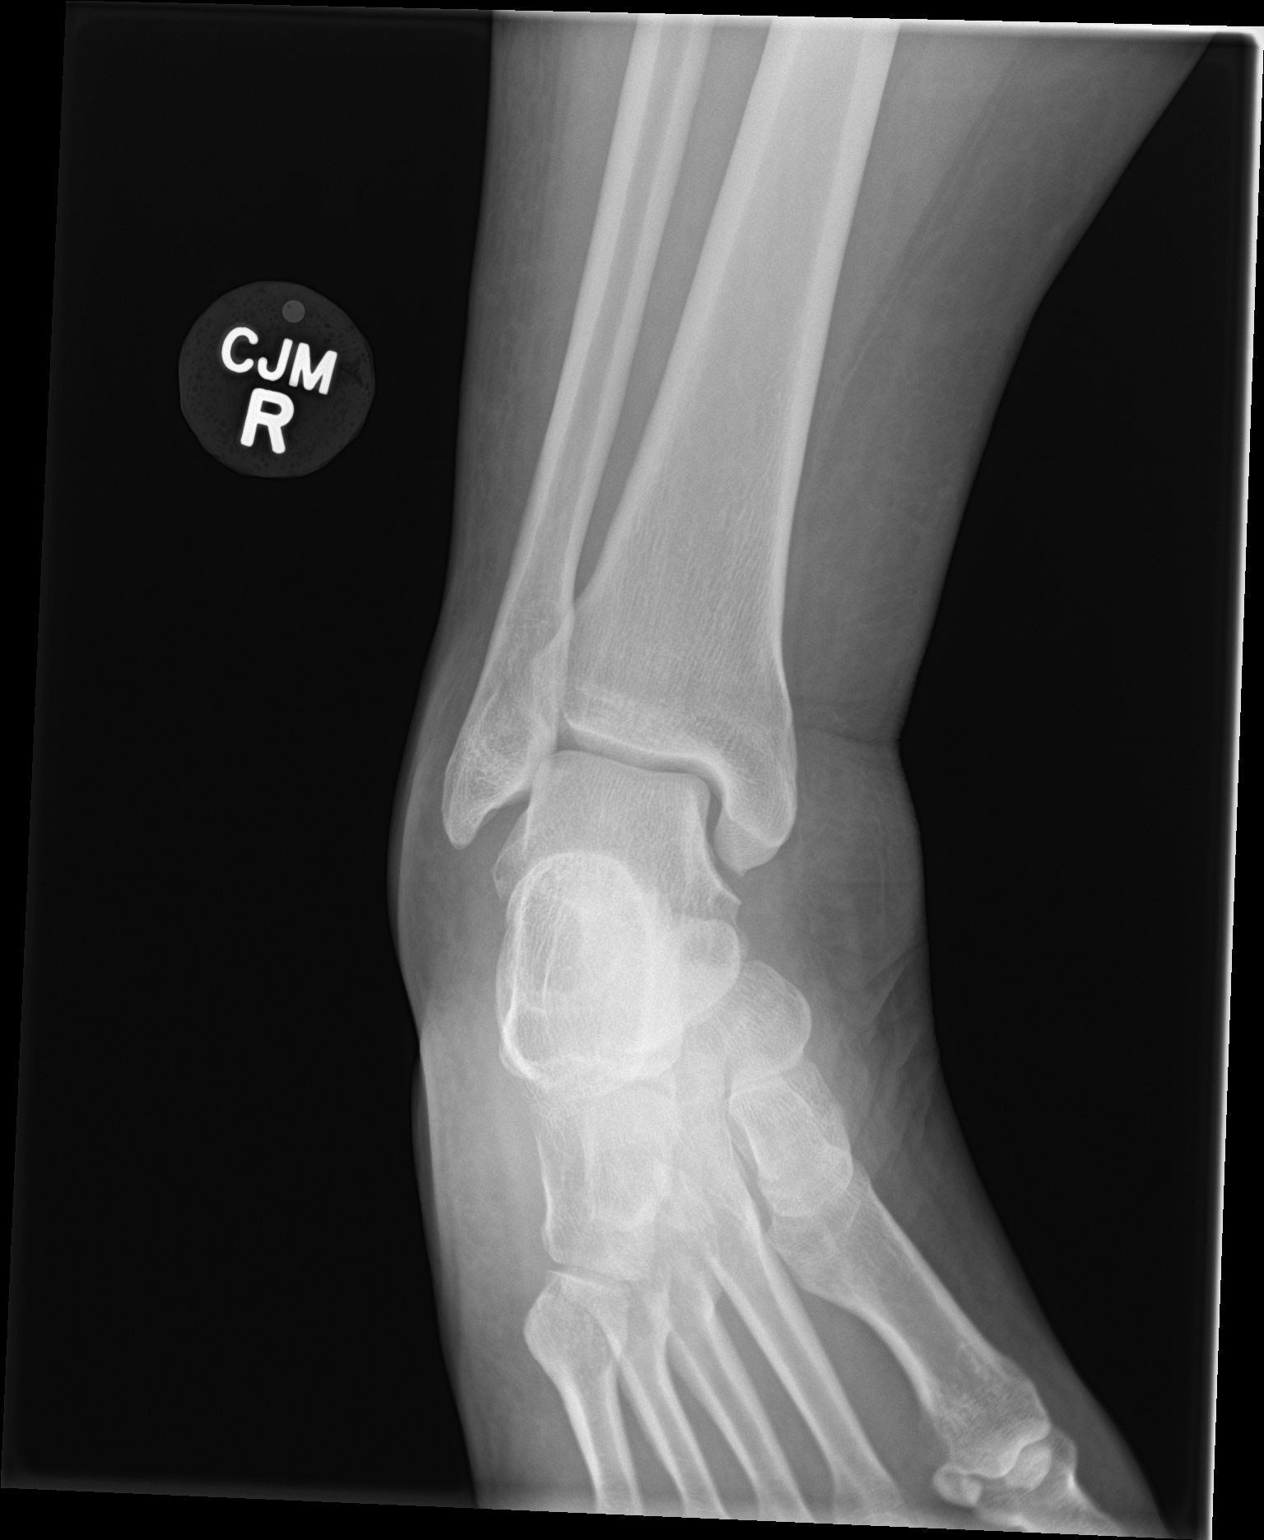

[ankle lat]
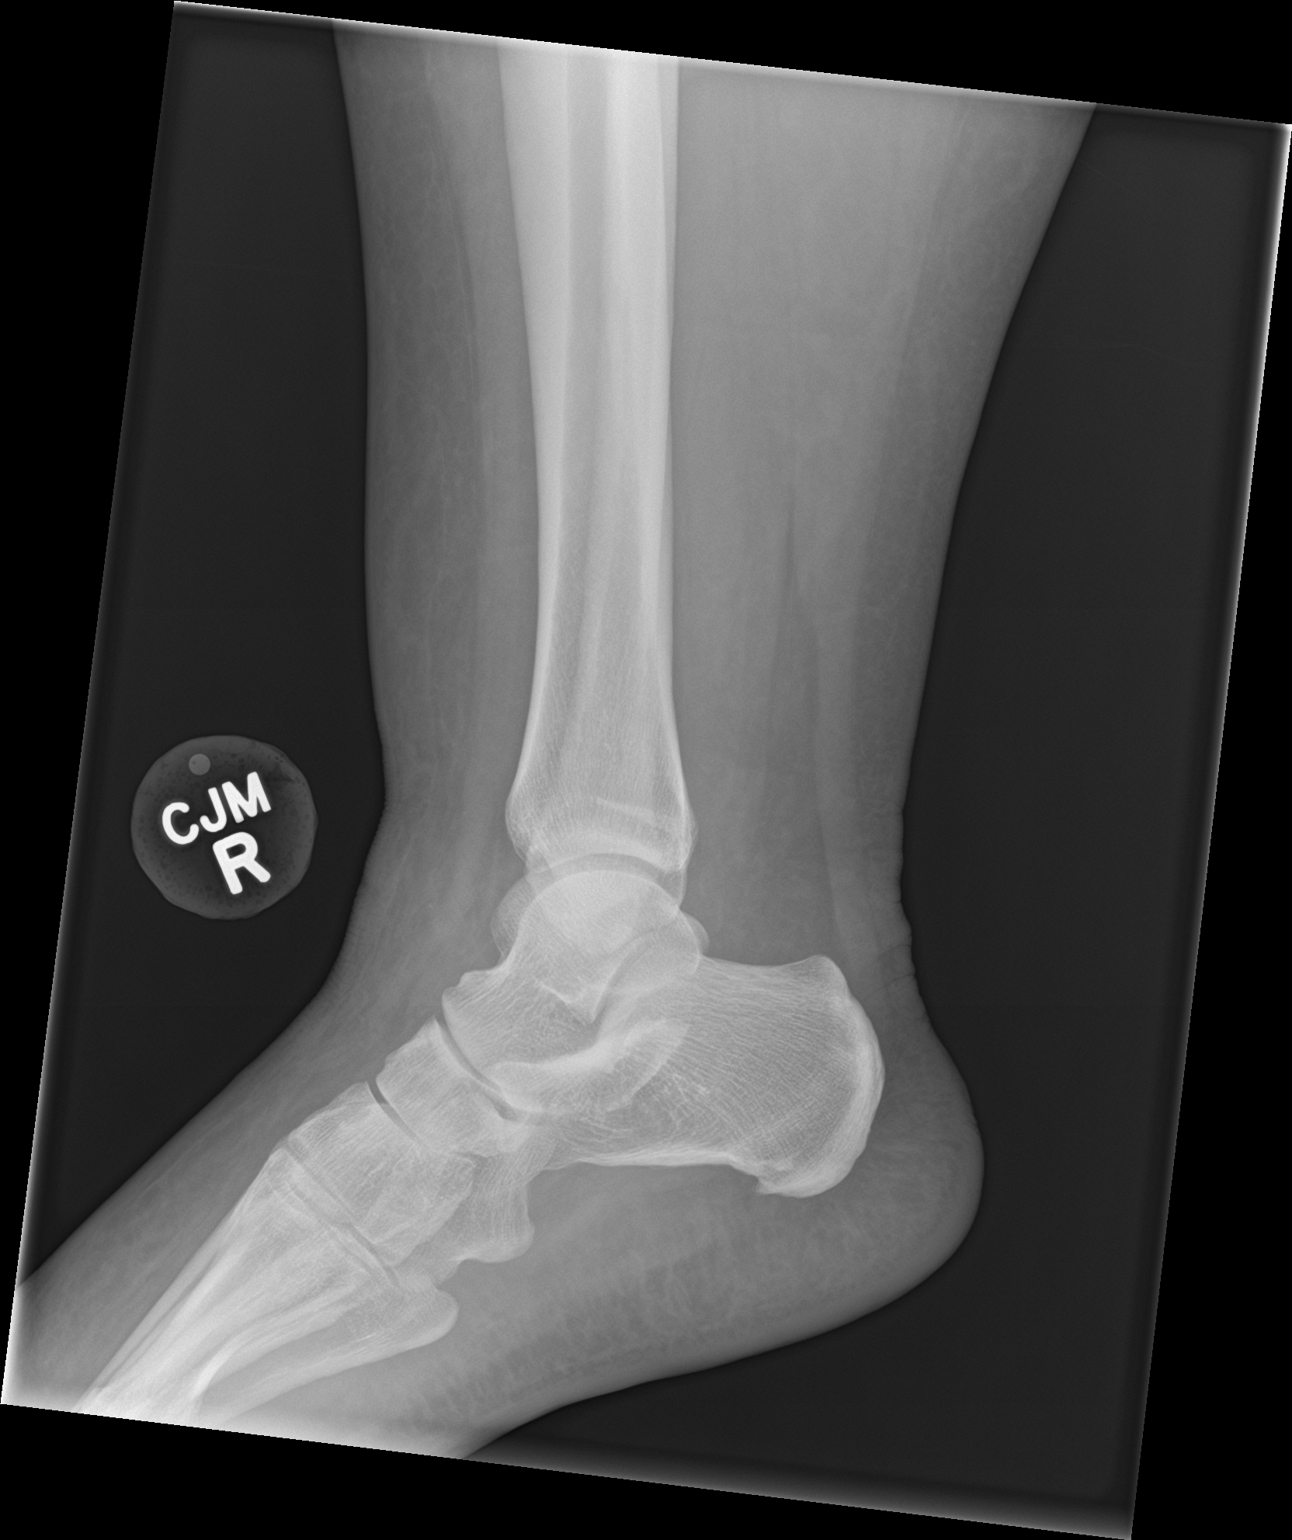

[3 of 3 positions shown; findings below may reference images not displayed]

FINDINGS: There is no evidence of fracture or dislocation. Ankle mortise is
congruent. There is no evidence of arthropathy or other focal bone
abnormality. No focal erosions. Soft tissues are unremarkable.
IMPRESSION: Negative.

## 2021-05-09 IMAGING — CR DG FOOT COMPLETE 3+V*R*
3 series · 3 of 3 positions shown · non-contrast
Comparison: None.

CLINICAL DATA: Atraumatic right foot and ankle pain

EXAM:
RIGHT FOOT COMPLETE - 3+ VIEW; RIGHT ANKLE - COMPLETE 3+ VIEW

[foot ap]
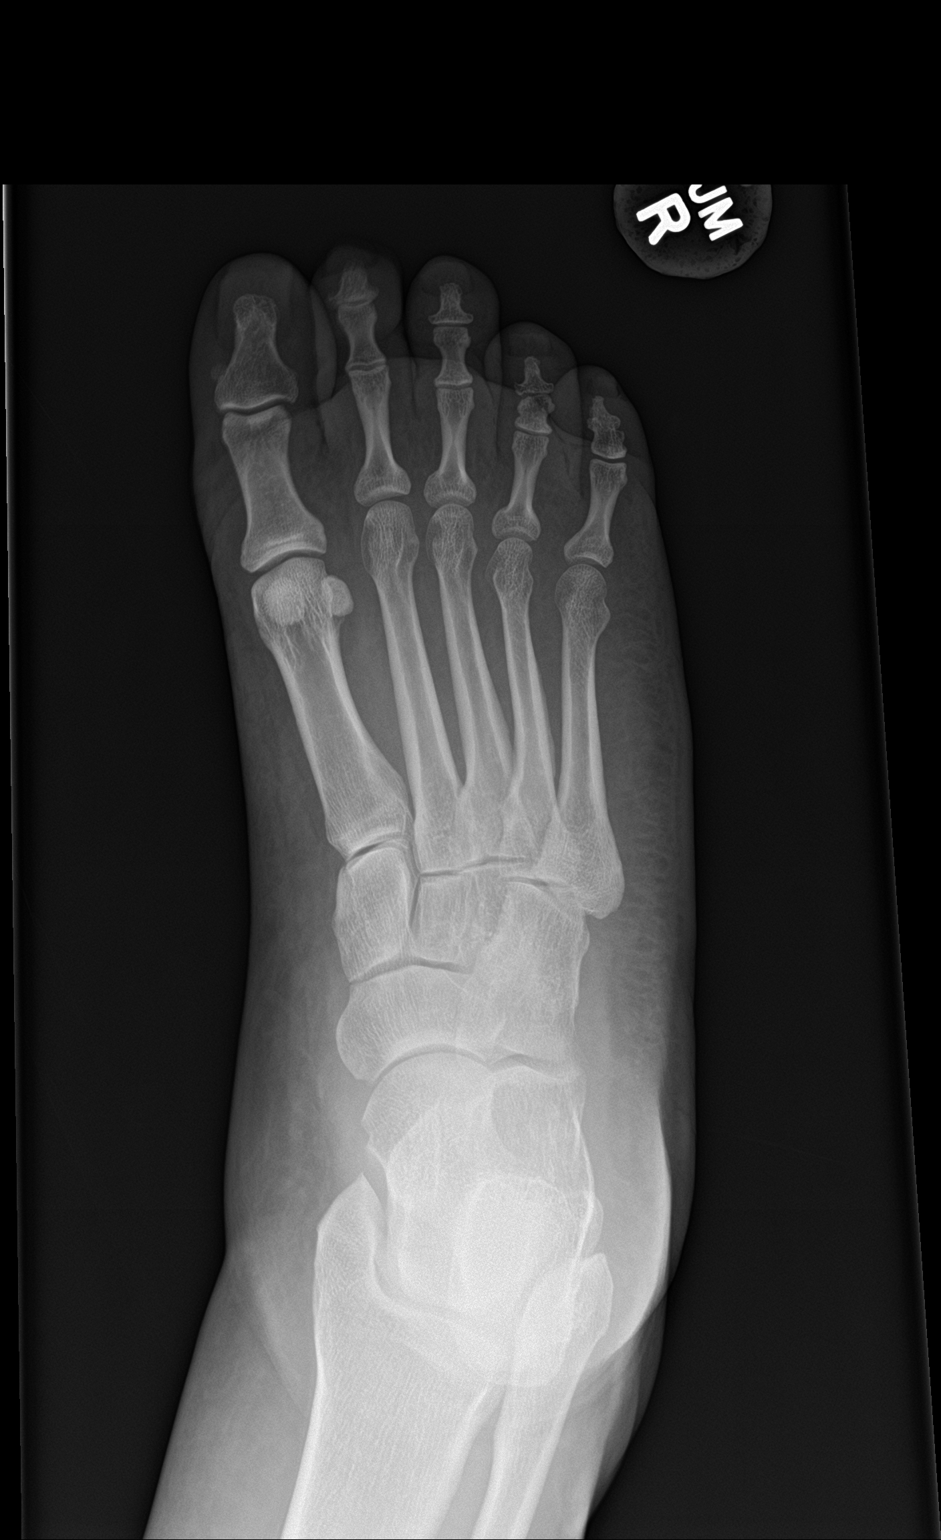

[foot obl]
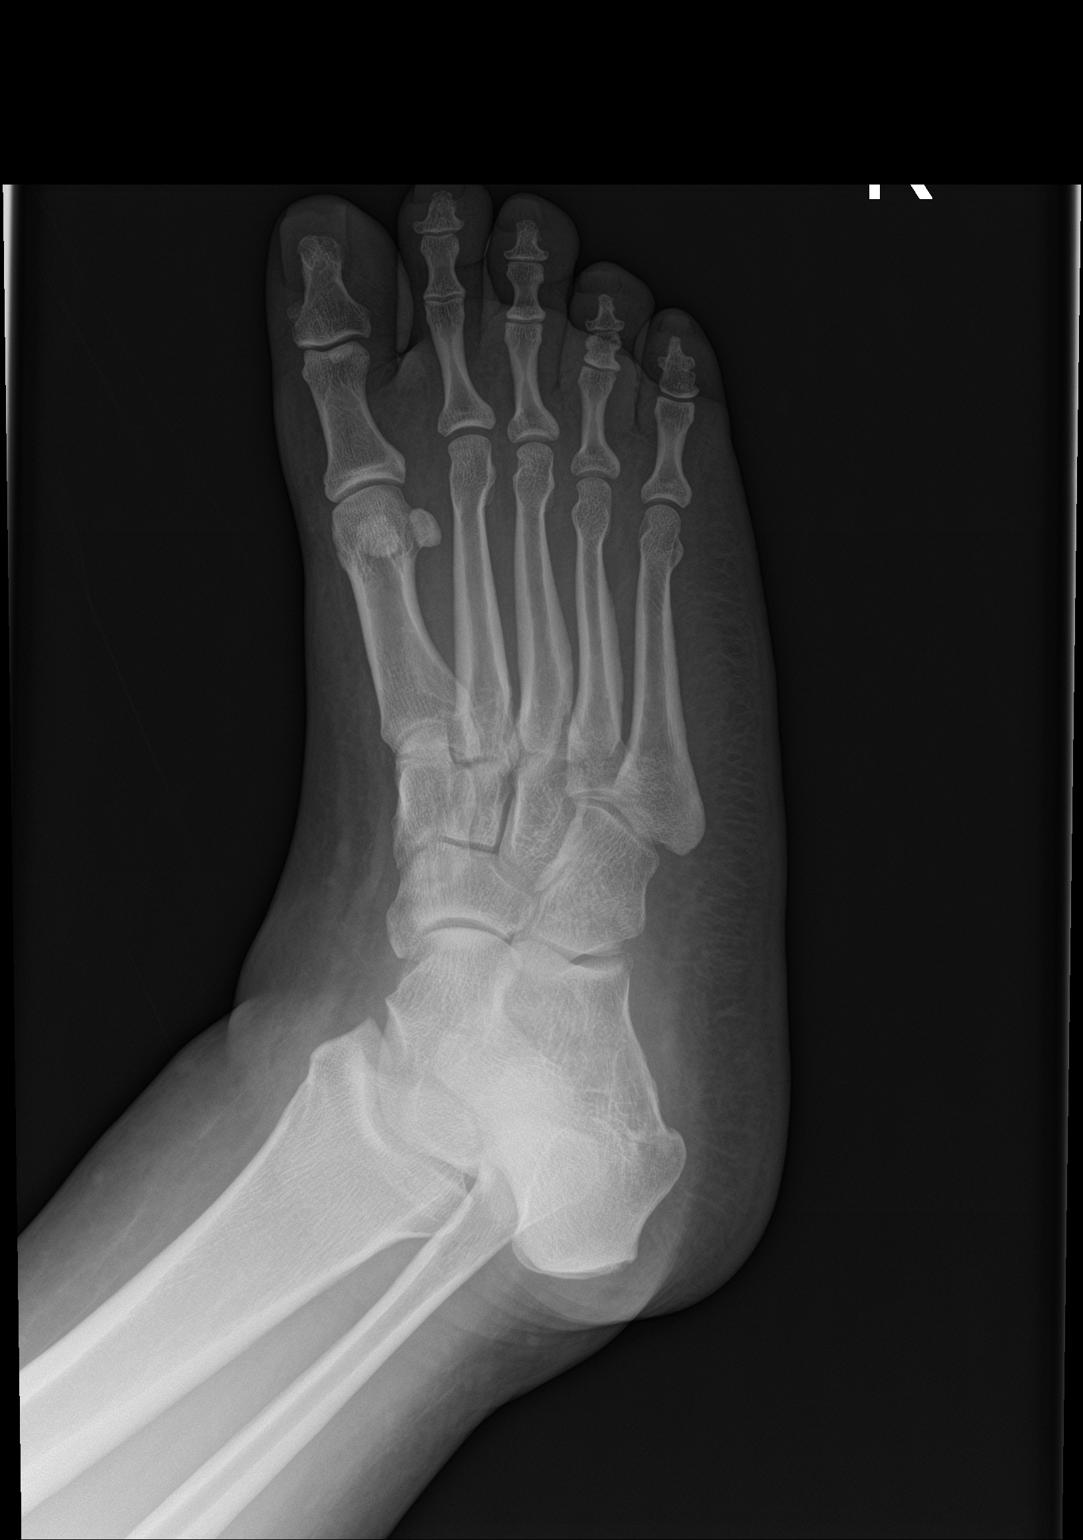

[foot lat]
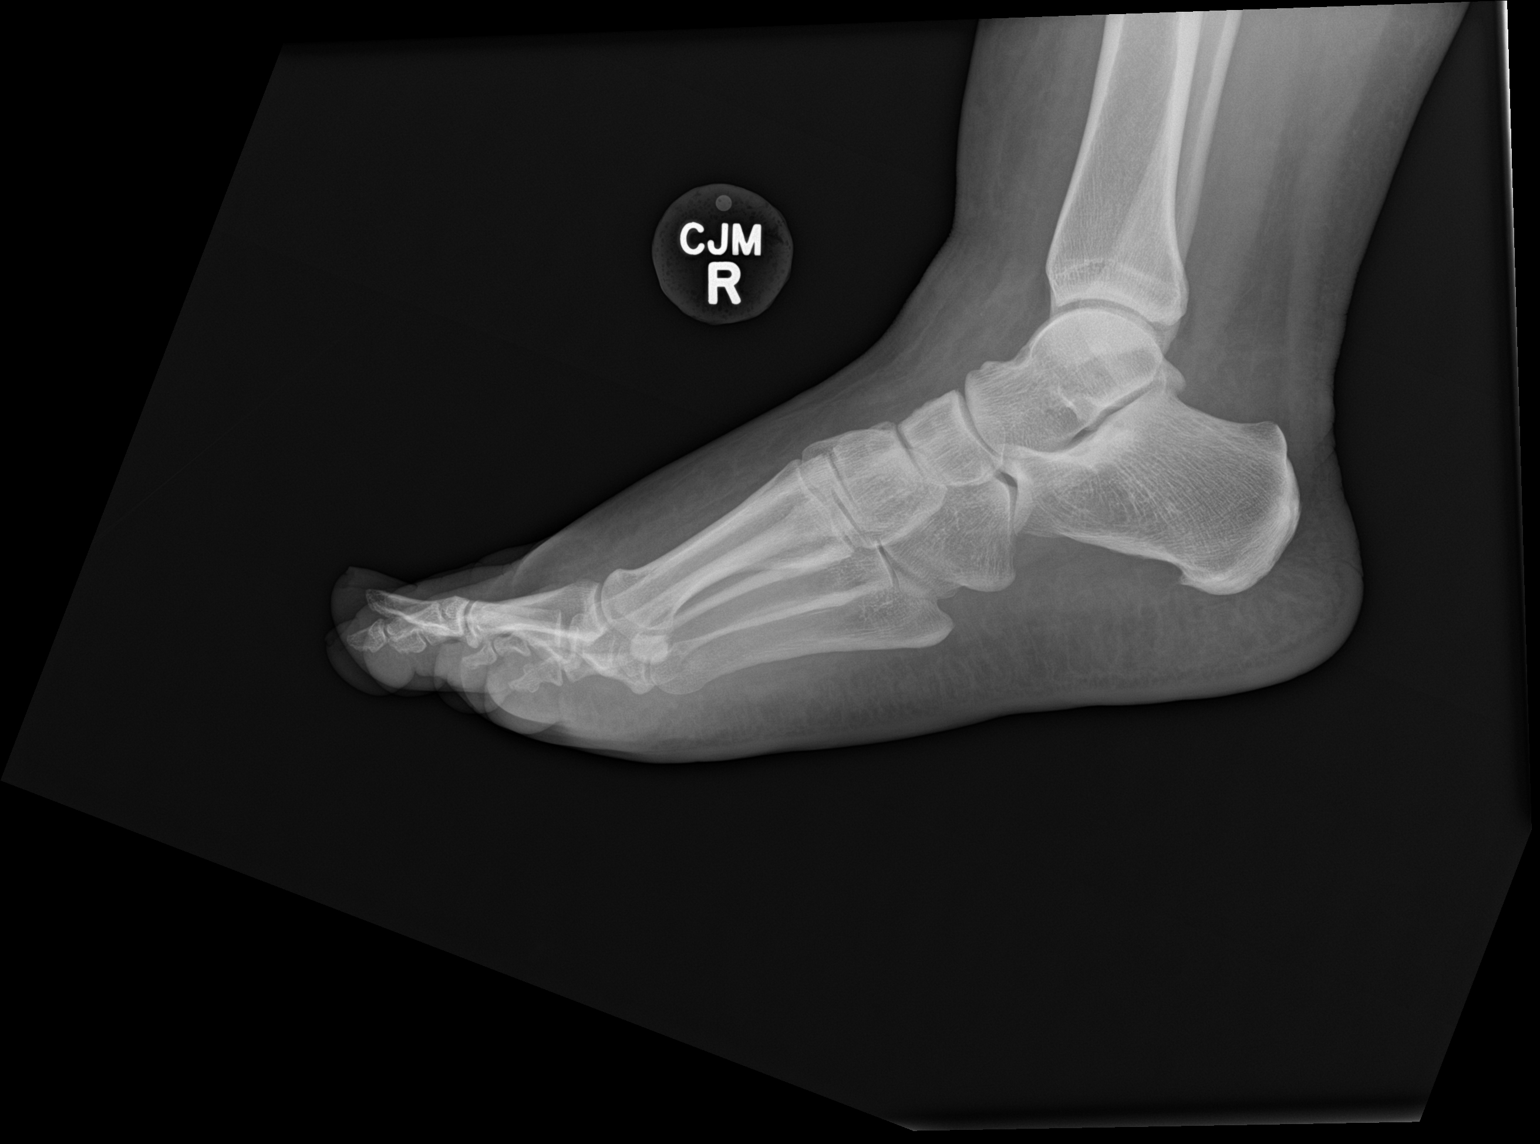

[3 of 3 positions shown; findings below may reference images not displayed]

FINDINGS: There is no evidence of fracture or dislocation. Ankle mortise is
congruent. There is no evidence of arthropathy or other focal bone
abnormality. No focal erosions. Soft tissues are unremarkable.
IMPRESSION: Negative.

## 2021-05-24 ENCOUNTER — Ambulatory Visit: Payer: 59 | Admitting: Podiatry

## 2021-06-26 NOTE — Progress Notes (Signed)
Office Visit    Patient Name: Joanna [name] Date of Encounter: 06/28/2021  Primary Care Provider:  Cipriano Mile, NP Primary Cardiologist:  Pixie Casino, MD  Chief Complaint   51 year old female with a history of chronic diastolic heart failure, hypertension, CKD stage III, morbid obesity, intellectual disability/illiteracy, type 2 diabetes who presents for follow-up related to chronic diastolic heart failure and hypertension.  Past Medical History    Past Medical History:  Diagnosis Date   Acute diastolic heart failure ([place]) 12/16/2019   Acute respiratory failure with hypoxia ([place]) 12/16/2019   AKI (acute kidney injury) ([place]) 12/16/2019   Anasarca 12/16/2019   Diabetes mellitus without complication ([place])    HTN (hypertension) 12/16/2019   Hypertension    Hypertensive urgency 12/16/2019   Intellectual disability 12/16/2019   Morbid obesity ([place])    New onset of congestive heart failure ([place]) 12/16/2019   Obesity, Class III, BMI 40-49.9 (morbid obesity) ([place]) 12/16/2019   Type 2 diabetes mellitus without complication ([place]) 1/32/4401   No past surgical history on file.  Allergies  No Known Allergies  History of Present Illness    51 year old female with past medical history including chronic diastolic heart failure, hypertension, CKD stage III, morbid obesity, intellectual disability/illiteracy, type 2 diabetes.  She was hospitalized in 2021 shortness of breath, hypertensive urgency, and acute hypoxemia in the setting of fluid volume overload and acute diastolic heart failure.  Chest x-ray showed mild cardiomegaly and increased interstitial prominence.  She was noted to have hypertensive urgency. Renal ultrasound was performed which showed possible renal agenesis of the left kidney.  She was noted to have a brief episode of Wenckebach with no recurrence. Echocardiogram in 12/2019 showed EF 60 to 65%, moderate pulmonary hypertension, mild to moderate LAE, and small pericardial  effusion.  She was diuresed with IV Lasix and was discharged on oral Lasix, carvedilol, Imdur/hydralazine.  She was last seen in the office on 01/18/2020 and was doing well overall from a cardiac standpoint.  She was exercising daily.  Her blood pressure remained elevated and she was started and amlodipine 5 mg daily.  She presents today for follow-up accompanied by her caregiver whom she lives with.  Since her last visit she has done well from a cardiac standpoint. Her BP is elevated in office today, she has not been checking her BP at home, though she states she thinks it was elevated when she last saw her nephrologist. She denies chest pain, shortness of breath, headache, or vision changes. She reports feeling well overall and has no concerns or complaints today.   Home Medications    Current Outpatient Medications  Medication Sig Dispense Refill   amLODipine (NORVASC) 5 MG tablet Take 1 tablet (5 mg total) by mouth daily. (Patient taking differently: Take 5 mg by mouth See admin instructions. Take two tablets by mouth each morning and take one tablet by mouth every evening) 90 tablet 3   aspirin EC 81 MG EC tablet Take 1 tablet (81 mg total) by mouth daily. Swallow whole. 30 tablet 11   hydrALAZINE (APRESOLINE) 50 MG tablet Take 1 tablet (50 mg total) by mouth every 8 (eight) hours. 90 tablet 0   isosorbide mononitrate (IMDUR) 120 MG 24 hr tablet Take 1 tablet (120 mg total) by mouth daily. 30 tablet 0   metformin (FORTAMET) 500 MG (OSM) 24 hr tablet Take 500 mg by mouth in the morning and at bedtime.      rosuvastatin (CRESTOR) 10 MG tablet  Take 10 mg by mouth at bedtime.     carvedilol (COREG) 6.25 MG tablet Take 1 tablet (6.25 mg total) by mouth 2 (two) times daily with a meal. 180 tablet 3   furosemide (LASIX) 20 MG tablet Take 2 tablets (40 mg total) by mouth daily. (Patient not taking: Reported on 05/11/2020) 60 tablet 0   glimepiride (AMARYL) 2 MG tablet Take 2 mg by mouth daily.  (Patient  not taking: Reported on 06/28/2021)     hydrochlorothiazide (HYDRODIURIL) 50 MG tablet Take 1 tablet (50 mg total) by mouth 2 (two) times daily. 90 tablet 3   HYDROcodone-acetaminophen (NORCO/VICODIN) 5-325 MG tablet Take 1 tablet by mouth every 6 (six) hours as needed. (Patient not taking: Reported on 06/28/2021) 8 tablet 0   No current facility-administered medications for this visit.     Review of Systems    She denies chest pain, palpitations, dyspnea, pnd, orthopnea, n, v, dizziness, syncope, edema, weight gain, or early satiety. All other systems reviewed and are otherwise negative except as noted above.    Physical Exam    VS:  BP (!) 148/98    Pulse 82    Ht 5' 2"  (1.575 m)    SpO2 100%    BMI 49.93 kg/m   GEN: Well nourished, well developed, in no acute distress. HEENT: normal. Neck: Supple, no JVD, carotid bruits, or masses. Cardiac: RRR, no murmurs, rubs, or gallops. No clubbing, cyanosis, edema.  Radials/DP/PT 2+ and equal bilaterally.  Respiratory:  Respirations regular and unlabored, clear to auscultation bilaterally. GI: Soft, nontender, nondistended, BS + x 4. MS: no deformity or atrophy. Skin: warm and dry, no rash. Neuro:  Strength and sensation are intact. Psych: Normal affect.  Accessory Clinical Findings    ECG personally reviewed by me today - Normal sinus rhythm, 82 bpm, non-specific ST-T-wave changes - no acute changes.  Lab Results  Component Value Date   WBC 6.8 12/17/2019   HGB 10.1 (L) 12/17/2019   HCT 34.7 (L) 12/17/2019   MCV 87.8 12/17/2019   PLT 337 12/17/2019   Lab Results  Component Value Date   CREATININE 1.76 (H) 01/18/2020   BUN 25 (H) 01/18/2020   NA 140 01/18/2020   K 4.5 01/18/2020   CL 103 01/18/2020   CO2 24 01/18/2020   Lab Results  Component Value Date   ALT 17 12/22/2019   AST 18 12/22/2019   ALKPHOS 36 (L) 12/22/2019   BILITOT 0.6 12/22/2019   No results found for: CHOL, HDL, LDLCALC, LDLDIRECT, TRIG, CHOLHDL  Lab  Results  Component Value Date   HGBA1C 7.4 (H) 12/17/2019    Assessment & Plan   1. Chronic diastolic heart failure/pulmonary hypertension: Echo 12/2019 showed EF 60 to 65%, moderate pulmonary hypertension, mild to moderate LAE. Euvolemic and well compensated on exam. Continue Lasix, carvedilol as below, isosorbide, hydralazine, and amlodipine.  2. Hypertension: BP elevated in office today, she has not been checking her BP at home regularly, but states she thinks it has been elevated at her doctor's appointments. HR stable, 80 bpm. We will increase her carvedilol to 6.25 mg bid. Her caregiver will monitor her BP daily, and report BP consistently > 130/80, SBP <110 and HR consistently > 100 bpm or < 60 bpm. If BP remains elevated and HR remains stable, consider titration of Coreg vs increasing amlodipine. Otherwise, continue current antihypertensive regimen.   3. Hyperlipidemia: LDL was 184 on 02/21/21. She was started on Rosuvastatin. Monitored and managed by PCP.  4. CKD stage III: Creatinine 1.83 on 02/21/21.  Stable.  Followed by nephrology.  5. Type 2 diabetes/morbid obesity: A1c 6.3 on 02/17/21. Monitored and managed by PCP.  6. Disposition: Follow-up in 1 year with Dr. Debara Pickett.   Lenna Sciara, NP 06/28/2021, 9:39 AM

## 2021-06-28 ENCOUNTER — Other Ambulatory Visit: Payer: Self-pay

## 2021-06-28 ENCOUNTER — Encounter (HOSPITAL_BASED_OUTPATIENT_CLINIC_OR_DEPARTMENT_OTHER): Payer: Self-pay | Admitting: General Practice

## 2021-06-28 ENCOUNTER — Ambulatory Visit (INDEPENDENT_AMBULATORY_CARE_PROVIDER_SITE_OTHER): Payer: Commercial Managed Care - HMO | Admitting: Nurse Practitioner

## 2021-06-28 VITALS — BP 148/98 | HR 82 | Ht 62.0 in

## 2021-06-28 DIAGNOSIS — E782 Mixed hyperlipidemia: Secondary | ICD-10-CM

## 2021-06-28 DIAGNOSIS — I10 Essential (primary) hypertension: Secondary | ICD-10-CM | POA: Diagnosis not present

## 2021-06-28 DIAGNOSIS — E119 Type 2 diabetes mellitus without complications: Secondary | ICD-10-CM

## 2021-06-28 DIAGNOSIS — N1831 Chronic kidney disease, stage 3a: Secondary | ICD-10-CM

## 2021-06-28 DIAGNOSIS — I5032 Chronic diastolic (congestive) heart failure: Secondary | ICD-10-CM

## 2021-06-28 MED ORDER — CARVEDILOL 6.25 MG PO TABS: 6.2500 mg | ORAL_TABLET | Freq: Two times a day (BID) | ORAL | 3 refills | Status: DC

## 2021-06-28 MED ORDER — HYDROCHLOROTHIAZIDE 50 MG PO TABS: 50.0000 mg | ORAL_TABLET | Freq: Two times a day (BID) | ORAL | 3 refills | Status: DC

## 2021-06-28 NOTE — Patient Instructions (Signed)
Medication Instructions:  Your physician has recommended you make the following change in your medication:   Change: Please increase your Carvedilol to 6.3m twice daily   *If you need a refill on your cardiac medications before your next appointment, please call your pharmacy*   Lab Work: None ordered today    Testing/Procedures: None ordered today   Follow-Up: [facility] and your [facility] needs are our priority.  As part of our continuing mission to provide you with exceptional heart care, we have created designated Provider Care Teams.  These Care Teams include your primary Cardiologist (physician) and Advanced Practice Providers (APPs -  Physician Assistants and Nurse Practitioners) who all work together to provide you with the care you need, when you need it.  We recommend signing up for the patient portal called "MyChart".  Sign up information is provided on this After Visit Summary.  MyChart is used to connect with patients for Virtual Visits (Telemedicine).  Patients are able to view lab/test results, encounter notes, upcoming appointments, etc.  Non-urgent messages can be sent to your provider as well.   To learn more about what you can do with MyChart, go to [email or web address]    Your next appointment:   1 year(s)  The format for your next appointment:   In Person  Provider:   KPixie Casino MD     Other Instructions Please monitor your blood pressure daily, 1-2 hours after taking your medications. Please keep a log! If your blood pressure is consistently greater than 1197/58or Systolic < 1832or Heart Rate >110 or <60 please call our office at ([phone]

## 2021-07-25 ENCOUNTER — Encounter: Payer: Self-pay | Admitting: *Deleted

## 2021-07-26 ENCOUNTER — Ambulatory Visit (INDEPENDENT_AMBULATORY_CARE_PROVIDER_SITE_OTHER): Payer: Medicare Other | Admitting: Diagnostic Neuroimaging

## 2021-07-26 ENCOUNTER — Encounter: Payer: Self-pay | Admitting: Diagnostic Neuroimaging

## 2021-07-26 VITALS — BP 155/84 | HR 90 | Ht 62.0 in | Wt 245.0 lb

## 2021-07-26 DIAGNOSIS — F79 Unspecified intellectual disabilities: Secondary | ICD-10-CM

## 2021-07-26 NOTE — Progress Notes (Signed)
waqs  [facility]  PATIENT: Joanna [name] DOB: [birth year]  REFERRING CLINICIAN: Cipriano Mile, NP HISTORY FROM: patient  REASON FOR VISIT: new consult    HISTORICAL  CHIEF COMPLAINT:  Chief Complaint  Patient presents with   Memory Concerns    Rm 6  caregiver- Melody x 10 yrs, "want to be evaluated to see if I can live alone" MMSE 18    HISTORY OF PRESENT ILLNESS:   51 year old female here for evaluation of cognitive impairment.  Patient was born with mild intellectual disability.  She grew up with her mother and sisters.  She attended school up to the ninth grade, with some special education classes.  At some point she transition to living in a foster home and then a group home around age 13 years old.  Around 2010 she moved in with her current caregiver Melody.  They lived in [place] from 2014-2017.  Now they live in [place].  Patient has always lived with other people.  She has never lived independently.  However recently she has started to think about this and has asked her caregiver and doctors to see if she could be cleared to live independently.  Patient able to take care of some of her ADLs including bathing, dressing, eating, preparing simple food at home.  She is not able to manage her money or her medications.  She is not able to drive a car.  She is able to walk to the store to make small purchases.   REVIEW OF SYSTEMS: Full 14 system review of systems performed and negative with exception of: as per HPI.  ALLERGIES: No Known Allergies  HOME MEDICATIONS: Outpatient Medications Prior to Visit  Medication Sig Dispense Refill   amLODipine (NORVASC) 5 MG tablet Take 1 tablet (5 mg total) by mouth daily. (Patient taking differently: Take 5 mg by mouth See admin instructions. Take two tablets by mouth each morning and take one tablet by mouth every evening) 90 tablet 3   aspirin EC 81 MG EC tablet Take 1 tablet (81 mg total) by mouth daily. Swallow  whole. 30 tablet 11   carvedilol (COREG) 6.25 MG tablet Take 1 tablet (6.25 mg total) by mouth 2 (two) times daily with a meal. 180 tablet 3   furosemide (LASIX) 20 MG tablet Take 2 tablets (40 mg total) by mouth daily. 60 tablet 0   glimepiride (AMARYL) 2 MG tablet Take 2 mg by mouth daily.     hydrALAZINE (APRESOLINE) 50 MG tablet Take 1 tablet (50 mg total) by mouth every 8 (eight) hours. 90 tablet 0   hydrochlorothiazide (HYDRODIURIL) 50 MG tablet Take 1 tablet (50 mg total) by mouth 2 (two) times daily. 90 tablet 3   HYDROcodone-acetaminophen (NORCO/VICODIN) 5-325 MG tablet Take 1 tablet by mouth every 6 (six) hours as needed. 8 tablet 0   isosorbide mononitrate (IMDUR) 120 MG 24 hr tablet Take 1 tablet (120 mg total) by mouth daily. 30 tablet 0   metformin (FORTAMET) 500 MG (OSM) 24 hr tablet Take 500 mg by mouth in the morning and at bedtime.      rosuvastatin (CRESTOR) 10 MG tablet Take 10 mg by mouth at bedtime.     No facility-administered medications prior to visit.    PAST MEDICAL HISTORY: Past Medical History:  Diagnosis Date   Acute diastolic heart failure ([place]) 12/16/2019   Acute respiratory failure with hypoxia ([place]) 12/16/2019   AKI (acute kidney injury) ([place]) 12/16/2019   Anasarca 12/16/2019  CAD (coronary artery disease)    Diabetes mellitus without complication ([place])    HTN (hypertension) 12/16/2019   Hypertension    Hypertensive urgency 12/16/2019   Intellectual disability 12/16/2019   Morbid obesity ([place])    New onset of congestive heart failure ([place]) 12/16/2019   Obesity, Class III, BMI 40-49.9 (morbid obesity) ([place]) 12/16/2019   Type 2 diabetes mellitus without complication ([place]) 63/06/6008    PAST SURGICAL HISTORY: History reviewed. No pertinent surgical history.  FAMILY HISTORY: Family History  Problem Relation Age of Onset   Heart disease Mother        Enlarged heart per patient   Hypertension Mother    Diabetes Mother    Hypertension Sister     Diabetes Sister     SOCIAL HISTORY: Social History   Socioeconomic History   Marital status: Single    Spouse name: Not on file   Number of children: 0   Years of education: Not on file   Highest education level: 9th grade  Occupational History   Not on file  Tobacco Use   Smoking status: Never   Smokeless tobacco: Never  Substance and Sexual Activity   Alcohol use: Not Currently    Comment: socially   Drug use: Never   Sexual activity: Not on file  Other Topics Concern   Not on file  Social History Narrative   07/26/21 lives with caregiver   Social Determinants of Health   Financial Resource Strain: Not on file  Food Insecurity: Not on file  Transportation Needs: Not on file  Physical Activity: Not on file  Stress: Not on file  Social Connections: Not on file  Intimate Partner Violence: Not on file     PHYSICAL EXAM  GENERAL EXAM/CONSTITUTIONAL: Vitals:  Vitals:   07/26/21 0901  BP: (!) 155/84  Pulse: 90  Weight: 245 lb (111.1 kg)  Height: 5' 2"  (1.575 m)   Body mass index is 44.81 kg/m. Wt Readings from Last 3 Encounters:  07/26/21 245 lb (111.1 kg)  01/18/20 (!) 273 lb (123.8 kg)  12/22/19 286 lb 9.6 oz (130 kg)   Patient is in no distress; well developed, nourished and groomed; neck is supple  CARDIOVASCULAR: Examination of carotid arteries is normal; no carotid bruits Regular rate and rhythm, no murmurs Examination of peripheral vascular system by observation and palpation is normal  EYES: Ophthalmoscopic exam of optic discs and posterior segments is normal; no papilledema or hemorrhages No results found.  MUSCULOSKELETAL: Gait, strength, tone, movements noted in Neurologic exam below  NEUROLOGIC: MENTAL STATUS:  MMSE - Mini Mental State Exam 07/26/2021  Not completed: (No Data)  Orientation to time 4  Orientation to Place 4  Registration 2  Attention/ Calculation 0  Recall 3  Language- name 2 objects 2  Language- repeat 0  Language-  follow 3 step command 3  Language- read & follow direction 0  Language-read & follow direction-comments Had to read to her  Write a sentence 0  Copy design 0  Total score 18   awake, alert, oriented to person, place and time recent and remote memory intact normal attention and concentration language fluent, comprehension intact, naming intact fund of knowledge appropriate  CRANIAL NERVE:  2nd - no papilledema on fundoscopic exam 2nd, 3rd, 4th, 6th - pupils equal and reactive to light, visual fields full to confrontation, extraocular muscles intact, no nystagmus 5th - facial sensation symmetric 7th - facial strength symmetric 8th - hearing intact 9th - palate elevates symmetrically,  uvula midline 11th - shoulder shrug symmetric 12th - tongue protrusion midline  MOTOR:  normal bulk and tone, full strength in the BUE, BLE  SENSORY:  normal and symmetric to light touch  COORDINATION:  finger-nose-finger, fine finger movements normal  REFLEXES:  deep tendon reflexes present and symmetric  GAIT/STATION:  narrow based gait     DIAGNOSTIC DATA (LABS, IMAGING, TESTING) - I reviewed patient records, labs, notes, testing and imaging myself where available.  Lab Results  Component Value Date   WBC 6.8 12/17/2019   HGB 10.1 (L) 12/17/2019   HCT 34.7 (L) 12/17/2019   MCV 87.8 12/17/2019   PLT 337 12/17/2019      Component Value Date/Time   NA 140 01/18/2020 0951   K 4.5 01/18/2020 0951   CL 103 01/18/2020 0951   CO2 24 01/18/2020 0951   GLUCOSE 104 (H) 01/18/2020 0951   GLUCOSE 132 (H) 12/22/2019 0422   BUN 25 (H) 01/18/2020 0951   CREATININE 1.76 (H) 01/18/2020 0951   CALCIUM 8.8 01/18/2020 0951   PROT 6.0 (L) 12/22/2019 0422   ALBUMIN 2.2 (L) 12/22/2019 0422   AST 18 12/22/2019 0422   ALT 17 12/22/2019 0422   ALKPHOS 36 (L) 12/22/2019 0422   BILITOT 0.6 12/22/2019 0422   GFRNONAA 34 (L) 01/18/2020 0951   GFRAA 39 (L) 01/18/2020 0951   No results found for:  CHOL, HDL, LDLCALC, LDLDIRECT, TRIG, CHOLHDL Lab Results  Component Value Date   HGBA1C 7.4 (H) 12/17/2019   Lab Results  Component Value Date   [id number] 790 12/19/2019   Lab Results  Component Value Date   TSH 2.496 12/17/2019       ASSESSMENT AND PLAN  51 y.o. year old female here with history of mild intellectual disability since birth.  Has always lived with family or caregiver support.  She is able to perform some activities on her own but likely needs support long-term with certain activities such as managing money, medications, transportation and safety.   Dx:  1. Intellectual disability      PLAN:  INTELLECTUAL DISABILITY - not likely able to live completely independently; encouraged patient take on more responsibility at home and sign up for activities outside of home; can try to be more independent, but with supervision; caregiver reviewed these recommendations and agrees.  Return for return to PCP.    Penni Bombard, MD 07/23/971, 5:32 AM Certified in Neurology, Neurophysiology and Neuroimaging  [facility] [address], [place],  [address] [phone]

## 2021-07-26 NOTE — Patient Instructions (Signed)
°  INTELLECTUAL DISABILITY - not likely able to live completely independently; encouraged patient take on more responsibility at home and sign up for activities outside of home; can try to be more independent, but with supervision

## 2021-08-16 ENCOUNTER — Telehealth: Payer: Self-pay | Admitting: *Deleted

## 2021-08-16 ENCOUNTER — Ambulatory Visit: Payer: Medicare Other | Admitting: Podiatry

## 2021-08-16 NOTE — Telephone Encounter (Signed)
Received SS "physician's statement of patient's capability to manage benefits" form. Placed on Dr [employer] desk for completion. ?

## 2021-08-16 NOTE — Telephone Encounter (Signed)
Form completed, signed, sent to medical records for processing with copy of office notes dated 07/26/21.  ?

## 2023-01-28 ENCOUNTER — Emergency Department (HOSPITAL_COMMUNITY): Payer: Medicare HMO

## 2023-01-28 ENCOUNTER — Inpatient Hospital Stay (HOSPITAL_COMMUNITY)
Admission: EM | Admit: 2023-01-28 | Discharge: 2023-02-04 | DRG: 682 | Disposition: A | Discharge status: Home Health | Payer: Medicare HMO | Attending: Internal Medicine | Admitting: Internal Medicine

## 2023-01-28 ENCOUNTER — Encounter (HOSPITAL_COMMUNITY): Payer: Self-pay

## 2023-01-28 DIAGNOSIS — E1122 Type 2 diabetes mellitus with diabetic chronic kidney disease: Secondary | ICD-10-CM | POA: Diagnosis present

## 2023-01-28 DIAGNOSIS — Z79899 Other long term (current) drug therapy: Secondary | ICD-10-CM

## 2023-01-28 DIAGNOSIS — D509 Iron deficiency anemia, unspecified: Secondary | ICD-10-CM | POA: Diagnosis present

## 2023-01-28 DIAGNOSIS — E662 Morbid (severe) obesity with alveolar hypoventilation: Secondary | ICD-10-CM | POA: Diagnosis present

## 2023-01-28 DIAGNOSIS — E872 Acidosis, unspecified: Secondary | ICD-10-CM | POA: Diagnosis present

## 2023-01-28 DIAGNOSIS — R29703 NIHSS score 3: Secondary | ICD-10-CM | POA: Diagnosis not present

## 2023-01-28 DIAGNOSIS — E11649 Type 2 diabetes mellitus with hypoglycemia without coma: Secondary | ICD-10-CM | POA: Diagnosis not present

## 2023-01-28 DIAGNOSIS — M545 Low back pain, unspecified: Secondary | ICD-10-CM | POA: Diagnosis present

## 2023-01-28 DIAGNOSIS — E119 Type 2 diabetes mellitus without complications: Secondary | ICD-10-CM

## 2023-01-28 DIAGNOSIS — Z6836 Body mass index (BMI) 36.0-36.9, adult: Secondary | ICD-10-CM

## 2023-01-28 DIAGNOSIS — Z713 Dietary counseling and surveillance: Secondary | ICD-10-CM

## 2023-01-28 DIAGNOSIS — I160 Hypertensive urgency: Secondary | ICD-10-CM | POA: Diagnosis present

## 2023-01-28 DIAGNOSIS — N184 Chronic kidney disease, stage 4 (severe): Secondary | ICD-10-CM | POA: Diagnosis present

## 2023-01-28 DIAGNOSIS — F70 Mild intellectual disabilities: Secondary | ICD-10-CM | POA: Diagnosis present

## 2023-01-28 DIAGNOSIS — I2510 Atherosclerotic heart disease of native coronary artery without angina pectoris: Secondary | ICD-10-CM | POA: Diagnosis present

## 2023-01-28 DIAGNOSIS — F79 Unspecified intellectual disabilities: Secondary | ICD-10-CM

## 2023-01-28 DIAGNOSIS — I6389 Other cerebral infarction: Secondary | ICD-10-CM | POA: Diagnosis not present

## 2023-01-28 DIAGNOSIS — D631 Anemia in chronic kidney disease: Secondary | ICD-10-CM | POA: Diagnosis present

## 2023-01-28 DIAGNOSIS — Z7984 Long term (current) use of oral hypoglycemic drugs: Secondary | ICD-10-CM

## 2023-01-28 DIAGNOSIS — E1165 Type 2 diabetes mellitus with hyperglycemia: Secondary | ICD-10-CM | POA: Diagnosis present

## 2023-01-28 DIAGNOSIS — Z7982 Long term (current) use of aspirin: Secondary | ICD-10-CM

## 2023-01-28 DIAGNOSIS — I5032 Chronic diastolic (congestive) heart failure: Secondary | ICD-10-CM | POA: Diagnosis present

## 2023-01-28 DIAGNOSIS — M549 Dorsalgia, unspecified: Secondary | ICD-10-CM | POA: Diagnosis present

## 2023-01-28 DIAGNOSIS — R0902 Hypoxemia: Secondary | ICD-10-CM | POA: Diagnosis not present

## 2023-01-28 DIAGNOSIS — Z22322 Carrier or suspected carrier of Methicillin resistant Staphylococcus aureus: Secondary | ICD-10-CM

## 2023-01-28 DIAGNOSIS — Z833 Family history of diabetes mellitus: Secondary | ICD-10-CM

## 2023-01-28 DIAGNOSIS — Z5986 Financial insecurity: Secondary | ICD-10-CM

## 2023-01-28 DIAGNOSIS — E875 Hyperkalemia: Secondary | ICD-10-CM | POA: Diagnosis present

## 2023-01-28 DIAGNOSIS — I6381 Other cerebral infarction due to occlusion or stenosis of small artery: Secondary | ICD-10-CM | POA: Diagnosis not present

## 2023-01-28 DIAGNOSIS — I130 Hypertensive heart and chronic kidney disease with heart failure and stage 1 through stage 4 chronic kidney disease, or unspecified chronic kidney disease: Secondary | ICD-10-CM | POA: Diagnosis present

## 2023-01-28 DIAGNOSIS — G9341 Metabolic encephalopathy: Secondary | ICD-10-CM | POA: Diagnosis present

## 2023-01-28 DIAGNOSIS — E669 Obesity, unspecified: Secondary | ICD-10-CM | POA: Diagnosis present

## 2023-01-28 DIAGNOSIS — E785 Hyperlipidemia, unspecified: Secondary | ICD-10-CM | POA: Diagnosis present

## 2023-01-28 DIAGNOSIS — N179 Acute kidney failure, unspecified: Principal | ICD-10-CM | POA: Diagnosis present

## 2023-01-28 DIAGNOSIS — N189 Chronic kidney disease, unspecified: Secondary | ICD-10-CM

## 2023-01-28 DIAGNOSIS — Z555 Less than a high school diploma: Secondary | ICD-10-CM

## 2023-01-28 DIAGNOSIS — Z8249 Family history of ischemic heart disease and other diseases of the circulatory system: Secondary | ICD-10-CM

## 2023-01-28 DIAGNOSIS — I6523 Occlusion and stenosis of bilateral carotid arteries: Secondary | ICD-10-CM | POA: Diagnosis not present

## 2023-01-28 LAB — URINALYSIS, ROUTINE W REFLEX MICROSCOPIC
Bacteria, UA: NONE SEEN
Bilirubin Urine: NEGATIVE
Glucose, UA: 50 mg/dL — AB
Hgb urine dipstick: NEGATIVE
Ketones, ur: NEGATIVE mg/dL
Leukocytes,Ua: NEGATIVE
Nitrite: NEGATIVE
Protein, ur: >=300 mg/dL — AB
Specific Gravity, Urine: 1.017 (ref 1.005–1.030)
pH: 5 (ref 5.0–8.0)

## 2023-01-28 LAB — CBC
HCT: 34.4 % — ABNORMAL LOW (ref 36.0–46.0)
Hemoglobin: 10.7 g/dL — ABNORMAL LOW (ref 12.0–15.0)
MCH: 26.8 pg (ref 26.0–34.0)
MCHC: 31.1 g/dL (ref 30.0–36.0)
MCV: 86.2 fL (ref 80.0–100.0)
Platelets: 286 10*3/uL (ref 150–400)
RBC: 3.99 MIL/uL (ref 3.87–5.11)
RDW: 21.7 % — ABNORMAL HIGH (ref 11.5–15.5)
WBC: 3.9 10*3/uL — ABNORMAL LOW (ref 4.0–10.5)
nRBC: 0 % (ref 0.0–0.2)

## 2023-01-28 LAB — CBG MONITORING, ED
Glucose-Capillary: 66 mg/dL — ABNORMAL LOW (ref 70–99)
Glucose-Capillary: 73 mg/dL (ref 70–99)
Glucose-Capillary: 114 mg/dL — ABNORMAL HIGH (ref 70–99)

## 2023-01-28 LAB — BASIC METABOLIC PANEL
Anion gap: 11 (ref 5–15)
BUN: 43 mg/dL — ABNORMAL HIGH (ref 6–20)
CO2: 20 mmol/L — ABNORMAL LOW (ref 22–32)
Calcium: 9.1 mg/dL (ref 8.9–10.3)
Chloride: 108 mmol/L (ref 98–111)
Creatinine, Ser: 3.93 mg/dL — ABNORMAL HIGH (ref 0.44–1.00)
GFR, Estimated: 13 mL/min — ABNORMAL LOW (ref >60)
Glucose, Bld: 89 mg/dL (ref 70–99)
Potassium: 4.2 mmol/L (ref 3.5–5.1)
Sodium: 139 mmol/L (ref 135–145)

## 2023-01-28 LAB — HCG, SERUM, QUALITATIVE: Preg, Serum: NEGATIVE

## 2023-01-28 LAB — GLUCOSE, CAPILLARY: Glucose-Capillary: 88 mg/dL (ref 70–99)

## 2023-01-28 MED ORDER — ONDANSETRON HCL 4 MG/2ML IJ SOLN
4.0000 mg | Freq: Four times a day (QID) | INTRAMUSCULAR | Status: DC | PRN
  Administered 2023-01-29 – 2023-02-03 (×4): 4 mg via INTRAVENOUS
  Filled 2023-01-28 (×5): qty 2

## 2023-01-28 MED ORDER — CHLORHEXIDINE GLUCONATE CLOTH 2 % EX PADS
6.0000 | MEDICATED_PAD | Freq: Every day | CUTANEOUS | Status: DC
  Administered 2023-01-28 – 2023-02-03 (×7): 6 via TOPICAL

## 2023-01-28 MED ORDER — SODIUM CHLORIDE 0.9 % IV BOLUS
1000.0000 mL | Freq: Once | INTRAVENOUS | Status: AC
  Administered 2023-01-28: 1000 mL via INTRAVENOUS

## 2023-01-28 MED ORDER — SORBITOL 70 % SOLN
30.0000 mL | Freq: Every day | Status: DC | PRN
  Administered 2023-02-04: 30 mL via ORAL
  Filled 2023-01-28 (×2): qty 30

## 2023-01-28 MED ORDER — GLIMEPIRIDE 2 MG PO TABS: 2.0000 mg | ORAL_TABLET | Freq: Every day | ORAL | Status: DC

## 2023-01-28 MED ORDER — ACETAMINOPHEN 500 MG PO TABS
500.0000 mg | ORAL_TABLET | Freq: Three times a day (TID) | ORAL | Status: DC | PRN
  Administered 2023-01-31 (×2): 500 mg via ORAL
  Filled 2023-01-28 (×2): qty 1

## 2023-01-28 MED ORDER — HYDROCODONE-ACETAMINOPHEN 5-325 MG PO TABS
1.0000 | ORAL_TABLET | Freq: Once | ORAL | Status: AC
  Administered 2023-01-28: 1 via ORAL
  Filled 2023-01-28: qty 1

## 2023-01-28 MED ORDER — ONDANSETRON HCL 4 MG PO TABS
4.0000 mg | ORAL_TABLET | Freq: Four times a day (QID) | ORAL | Status: DC | PRN
  Administered 2023-01-30: 4 mg via ORAL
  Filled 2023-01-28: qty 1

## 2023-01-28 MED ORDER — INSULIN ASPART 100 UNIT/ML IJ SOLN
0.0000 [IU] | Freq: Three times a day (TID) | INTRAMUSCULAR | Status: DC
  Administered 2023-02-02 – 2023-02-04 (×2): 1 [IU] via SUBCUTANEOUS
  Filled 2023-01-28: qty 0.06

## 2023-01-28 MED ORDER — HEPARIN SODIUM (PORCINE) 5000 UNIT/ML IJ SOLN
5000.0000 [IU] | Freq: Three times a day (TID) | INTRAMUSCULAR | Status: DC
  Administered 2023-01-29 – 2023-02-04 (×20): 5000 [IU] via SUBCUTANEOUS
  Filled 2023-01-28 (×20): qty 1

## 2023-01-28 MED ORDER — DEXTROSE 50 % IV SOLN: 1.0000 | Freq: Once | INTRAVENOUS | Status: DC

## 2023-01-28 MED ORDER — ISOSORBIDE DINITRATE 10 MG PO TABS
10.0000 mg | ORAL_TABLET | Freq: Three times a day (TID) | ORAL | Status: DC
  Administered 2023-01-28 – 2023-02-04 (×20): 10 mg via ORAL
  Filled 2023-01-28 (×21): qty 1

## 2023-01-28 MED ORDER — ORAL CARE MOUTH RINSE: 15.0000 mL | OROMUCOSAL | Status: DC | PRN

## 2023-01-28 MED ORDER — CARVEDILOL 6.25 MG PO TABS
6.2500 mg | ORAL_TABLET | Freq: Two times a day (BID) | ORAL | Status: DC
  Administered 2023-01-28 – 2023-02-02 (×11): 6.25 mg via ORAL
  Filled 2023-01-28 (×4): qty 1
  Filled 2023-01-28: qty 2
  Filled 2023-01-28 (×6): qty 1

## 2023-01-28 MED ORDER — HYDRALAZINE HCL 25 MG PO TABS
50.0000 mg | ORAL_TABLET | Freq: Three times a day (TID) | ORAL | Status: DC
  Administered 2023-01-28 – 2023-01-31 (×8): 50 mg via ORAL
  Filled 2023-01-28 (×8): qty 2

## 2023-01-28 MED ORDER — AMLODIPINE BESYLATE 5 MG PO TABS
10.0000 mg | ORAL_TABLET | Freq: Every day | ORAL | Status: DC
  Administered 2023-01-29 – 2023-02-04 (×7): 10 mg via ORAL
  Filled 2023-01-28 (×7): qty 2

## 2023-01-28 MED ORDER — LABETALOL HCL 5 MG/ML IV SOLN
10.0000 mg | INTRAVENOUS | Status: DC | PRN
  Administered 2023-01-29 – 2023-02-04 (×6): 10 mg via INTRAVENOUS
  Filled 2023-01-28 (×6): qty 4

## 2023-01-28 MED ORDER — DEXTROSE 50 % IV SOLN
1.0000 | Freq: Once | INTRAVENOUS | Status: AC
  Administered 2023-01-28: 50 mL via INTRAVENOUS
  Filled 2023-01-28: qty 50

## 2023-01-28 MED ORDER — LABETALOL HCL 5 MG/ML IV SOLN
20.0000 mg | Freq: Once | INTRAVENOUS | Status: AC
  Administered 2023-01-28: 20 mg via INTRAVENOUS
  Filled 2023-01-28: qty 4

## 2023-01-28 MED ORDER — LIDOCAINE 5 % EX PTCH
1.0000 | MEDICATED_PATCH | CUTANEOUS | Status: DC
  Administered 2023-01-28 – 2023-02-04 (×7): 1 via TRANSDERMAL
  Filled 2023-01-28 (×7): qty 1

## 2023-01-28 MED ORDER — HYDRALAZINE HCL 20 MG/ML IJ SOLN
10.0000 mg | Freq: Four times a day (QID) | INTRAMUSCULAR | Status: DC | PRN
  Administered 2023-01-29 – 2023-02-03 (×4): 10 mg via INTRAVENOUS
  Filled 2023-01-28 (×5): qty 1

## 2023-01-28 MED ORDER — HEPARIN SODIUM (PORCINE) 5000 UNIT/ML IJ SOLN: 5000.0000 [IU] | Freq: Three times a day (TID) | INTRAMUSCULAR | Status: DC

## 2023-01-28 MED ORDER — OXYCODONE HCL 5 MG PO TABS
2.5000 mg | ORAL_TABLET | Freq: Four times a day (QID) | ORAL | Status: DC | PRN
  Administered 2023-01-29 – 2023-01-30 (×4): 2.5 mg via ORAL
  Filled 2023-01-28 (×4): qty 1

## 2023-01-28 NOTE — ED Notes (Signed)
ED TO INPATIENT HANDOFF REPORT  S Name/Age/Gender Joanna [name] 52 y.o. female Room/Bed: WA15/WA15  Code Status   Code Status: Full Code  Home/SNF/Other Home Patient oriented to: self, place, time, and situation; but seems to be a little mentally delayed Is this baseline? Yes   Triage Complete: Triage complete  Chief Complaint Hypertensive urgency, malignant [I16.0]  Triage Note No notes on file   Allergies No Known Allergies  Level of Care/Admitting Diagnosis ED Disposition     ED Disposition  Admit   Condition  --   Comment  Hospital Area: [facility] [[id number]]  Level of Care: Stepdown [14]  Admit to SDU based on following criteria: Cardiac Instability:  Patients experiencing chest pain, unconfirmed MI and stable, arrhythmias and CHF requiring medical management and potentially compromising patient's stability  May admit patient to [facility] or [facility] if equivalent level of care is available:: Yes  Covid Evaluation: Asymptomatic - no recent exposure (last 10 days) testing not required  Diagnosis: Hypertensive urgency, malignant [409811]  Admitting Physician: Buena Irish [[id number]]  Attending Physician: Buena Irish [[id number]]  Certification:: I certify this patient will need inpatient services for at least 2 midnights  Estimated Length of Stay: 2          B Medical/Surgery History Past Medical History:  Diagnosis Date   Acute diastolic heart failure (H[facility]) 12/16/2019   Acute respiratory failure with hypoxia ([facility]) 12/16/2019   AKI (acute kidney injury) ([facility]) 12/16/2019   Anasarca 12/16/2019   CAD (coronary artery disease)    Diabetes mellitus without complication ([facility])    HTN (hypertension) 12/16/2019   Hypertension    Hypertensive urgency 12/16/2019   Intellectual disability 12/16/2019   Morbid obesity ([facility])    New onset of congestive heart failure (H[facility]) 12/16/2019   Obesity, Class III, BMI 40-49.9 (morbid obesity) (H[facility])  12/16/2019   Type 2 diabetes mellitus without complication ([facility]) 12/16/2019   History reviewed. No pertinent surgical history.   A IV Location/Drains/Wounds Patient Lines/Drains/Airways Status     Active Line/Drains/Airways     Name Placement date Placement time Site Days   Peripheral IV 01/28/23 20 G 1.25" Anterior;Left;Proximal Forearm 01/28/23  1435  Forearm  less than 1   Peripheral IV 01/28/23 18 G 1.25" Right Antecubital 01/28/23  1459  Antecubital  less than 1            Intake/Output Last 24 hours No intake or output data in the 24 hours ending 01/28/23 2207  Labs/Imaging Results for orders placed or performed during the hospital encounter of 01/28/23 (from the past 48 hour(s))  hCG, serum, qualitative     Status: None   Collection Time: 01/28/23  2:36 PM  Result Value Ref Range   Preg, Serum NEGATIVE NEGATIVE    Comment:        THE SENSITIVITY OF THIS METHODOLOGY IS >10 mIU/mL. Performed at [facility], [address], [place], [place] [address]   CBC     Status: Abnormal   Collection Time: 01/28/23  2:36 PM  Result Value Ref Range   WBC 3.9 (L) 4.0 - 10.5 K/uL   RBC 3.99 3.87 - 5.11 MIL/uL   Hemoglobin 10.7 (L) 12.0 - 15.0 g/dL   HCT 29.5 (L) 62.1 - 30.8 %   MCV 86.2 80.0 - 100.0 fL   MCH 26.8 26.0 - 34.0 pg   MCHC 31.1 30.0 - 36.0 g/dL   RDW 65.7 (H) 84.6 - 96.2 %  Platelets 286 150 - 400 K/uL   nRBC 0.0 0.0 - 0.2 %    Comment: Performed at [facility], [address] [place], [place] [address]  Basic metabolic panel     Status: Abnormal   Collection Time: 01/28/23  2:36 PM  Result Value Ref Range   Sodium 139 135 - 145 mmol/L   Potassium 4.2 3.5 - 5.1 mmol/L   Chloride 108 98 - 111 mmol/L   CO2 20 (L) 22 - 32 mmol/L   Glucose, Bld 89 70 - 99 mg/dL    Comment: Glucose reference range applies only to samples taken after fasting for at least 8 hours.   BUN 43 (H) 6 - 20 mg/dL   Creatinine, Ser 4.69 (H) 0.44  - 1.00 mg/dL   Calcium 9.1 8.9 - 62.9 mg/dL   GFR, Estimated 13 (L) >60 mL/min    Comment: (NOTE) Calculated using the CKD-EPI Creatinine Equation (2021)    Anion gap 11 5 - 15    Comment: Performed at [facility], [address], [place], [place] [address]  CBG monitoring, ED     Status: Abnormal   Collection Time: 01/28/23  2:51 PM  Result Value Ref Range   Glucose-Capillary 66 (L) 70 - 99 mg/dL    Comment: Glucose reference range applies only to samples taken after fasting for at least 8 hours.  CBG monitoring, ED     Status: None   Collection Time: 01/28/23  3:44 PM  Result Value Ref Range   Glucose-Capillary 73 70 - 99 mg/dL    Comment: Glucose reference range applies only to samples taken after fasting for at least 8 hours.  Urinalysis, Routine w reflex microscopic -Urine, Clean Catch     Status: Abnormal   Collection Time: 01/28/23  4:29 PM  Result Value Ref Range   Color, Urine YELLOW YELLOW   APPearance HAZY (A) CLEAR   Specific Gravity, Urine 1.017 1.005 - 1.030   pH 5.0 5.0 - 8.0   Glucose, UA 50 (A) NEGATIVE mg/dL   Hgb urine dipstick NEGATIVE NEGATIVE   Bilirubin Urine NEGATIVE NEGATIVE   Ketones, ur NEGATIVE NEGATIVE mg/dL   Protein, ur >=324 (A) NEGATIVE mg/dL   Nitrite NEGATIVE NEGATIVE   Leukocytes,Ua NEGATIVE NEGATIVE   RBC / HPF 0-5 0 - 5 RBC/hpf   WBC, UA 0-5 0 - 5 WBC/hpf   Bacteria, UA NONE SEEN NONE SEEN   Squamous Epithelial / HPF 0-5 0 - 5 /HPF   Mucus PRESENT     Comment: Performed at [facility], [address] [place], [place] [address]  CBG monitoring, ED     Status: Abnormal   Collection Time: 01/28/23  8:06 PM  Result Value Ref Range   Glucose-Capillary 114 (H) 70 - 99 mg/dL    Comment: Glucose reference range applies only to samples taken after fasting for at least 8 hours.   US Aorta  Result Date: 01/28/2023 CLINICAL DATA:  Back pain. EXAM: ULTRASOUND OF ABDOMINAL AORTA TECHNIQUE: Ultrasound  examination of the abdominal aorta and proximal common iliac arteries was performed to evaluate for aneurysm. Additional color and Doppler images of the distal aorta were obtained to document patency. COMPARISON:  CT 01/28/2023 FINDINGS: Abdominal aortic measurements as follows: Proximal:  2.3 x 2.4 cm Mid:  1.9 x 2.3 cm Distal: Obscured by bowel gas Iliac vessels are obscured by bowel gas. Note is made of a umbilical hernia with some fluid. IMPRESSION: Partially  obscured by overlapping bowel gas. What is seen is nonaneurysmal. Please correlate with the prior CT Electronically Signed   By: Karen Kays M.D.   On: 01/28/2023 16:55   CT ABDOMEN PELVIS WO CONTRAST  Result Date: 01/28/2023 CLINICAL DATA:  Back pain, AKI EXAM: CT ABDOMEN AND PELVIS WITHOUT CONTRAST TECHNIQUE: Multidetector CT imaging of the abdomen and pelvis was performed following the standard protocol without IV contrast. RADIATION DOSE REDUCTION: This exam was performed according to the departmental dose-optimization program which includes automated exposure control, adjustment of the mA and/or kV according to patient size and/or use of iterative reconstruction technique. COMPARISON:  Renal ultrasound 12/18/2019 FINDINGS: Lower chest: Small right pleural effusion. Mild interlobular septal thickening and mild ground-glass opacities likely due to edema. Cardiomegaly. Small pericardial effusion. Hepatobiliary: Unremarkable noncontrast appearance of the liver. Gallbladder and biliary tree are unremarkable. Pancreas: Unremarkable. Spleen: Unremarkable. Adrenals/Urinary Tract: Unremarkable adrenal glands. Nonspecific bilateral perinephric stranding. No urinary calculi or hydronephrosis. Unremarkable bladder. Stomach/Bowel: Normal caliber large and small bowel. Normal appendix. Stomach is within normal limits. Vascular/Lymphatic: Aortic atherosclerosis. No enlarged abdominal or pelvic lymph nodes. Reproductive: Globular enlargement of the uterus likely  due to fibroids. No adnexal mass. Other: Fat containing periumbilical hernia. Diffuse body wall edema. No free intraperitoneal fluid or air. Musculoskeletal: No acute fracture. Bilateral L5 pars defects with grade 1 anterolisthesis of L5 on S1. Advanced disc space height loss at L5-S1. IMPRESSION: 1. Small right pleural effusion. Mild interlobular septal thickening and mild ground-glass opacities likely due to edema. 2. Cardiomegaly with small pericardial effusion. 3. No definite acute abnormality in the abdomen or pelvis. Bilateral perinephric stranding is nonspecific and may be normal or due to edema. Pyelonephritis is difficult to exclude without IV contrast. 4. Chronic bilateral L5 spondylolysis with grade 1 spondylolisthesis. 5. Fibroid uterus. Aortic Atherosclerosis (ICD10-I70.0). Electronically Signed   By: Minerva Fester M.D.   On: 01/28/2023 16:23    Pending Labs Unresulted Labs (From admission, onward)     Start     Ordered   01/29/23 0500  Basic metabolic panel  Tomorrow morning,   R        01/28/23 1900   01/29/23 0500  CBC  Tomorrow morning,   R        01/28/23 1900   01/29/23 0500  Hemoglobin A1c  Tomorrow morning,   R       Comments: To assess prior glycemic control    01/28/23 1914   01/28/23 1855  HIV Antibody (routine testing w rflx)  (HIV Antibody (Routine testing w reflex) panel)  Once,   R        01/28/23 1900            Vitals/Pain Today's Vitals   01/28/23 1823 01/28/23 1830 01/28/23 1835 01/28/23 2008  BP: (!) 169/128 (!) 189/102 (!) 178/107 (!) 179/121  Pulse: 78 82 82 85  Resp:  18 16 17   Temp:    97.9 F (36.6 C)  TempSrc:    Oral  SpO2: 94% 90% 93% 93%  Weight:      Height:      PainSc:        Isolation Precautions No active isolations  Medications Medications  lidocaine (LIDODERM) 5 % 1 patch (1 patch Transdermal Patch Applied 01/28/23 1445)  ondansetron (ZOFRAN) tablet 4 mg (has no administration in time range)    Or  ondansetron (ZOFRAN)  injection 4 mg (has no administration in time range)  sorbitol 70 % solution 30 mL (  has no administration in time range)  oxyCODONE (Oxy IR/ROXICODONE) immediate release tablet 2.5 mg (has no administration in time range)  acetaminophen (TYLENOL) tablet 500 mg (has no administration in time range)  hydrALAZINE (APRESOLINE) injection 10 mg (has no administration in time range)  amLODipine (NORVASC) tablet 10 mg (has no administration in time range)  carvedilol (COREG) tablet 6.25 mg (6.25 mg Oral Given 01/28/23 2009)  isosorbide dinitrate (ISORDIL) tablet 10 mg (has no administration in time range)  hydrALAZINE (APRESOLINE) tablet 50 mg (has no administration in time range)  glimepiride (AMARYL) tablet 2 mg (has no administration in time range)  insulin aspart (novoLOG) injection 0-6 Units ( Subcutaneous Not Given 01/28/23 2007)  labetalol (NORMODYNE) injection 10 mg (has no administration in time range)  heparin injection 5,000 Units (has no administration in time range)  labetalol (NORMODYNE) injection 20 mg (20 mg Intravenous Given 01/28/23 1445)  HYDROcodone-acetaminophen (NORCO/VICODIN) 5-325 MG per tablet 1 tablet (1 tablet Oral Given 01/28/23 1445)  dextrose 50 % solution 50 mL (50 mLs Intravenous Given 01/28/23 1550)  sodium chloride 0.9 % bolus 1,000 mL (1,000 mLs Intravenous New Bag/Given 01/28/23 2055)    Mobility walks      R Report given to: Lynnell Dike

## 2023-01-28 NOTE — ED Provider Notes (Signed)
[place] EMERGENCY DEPARTMENT AT [facility] Provider Note   CSN: [id number] Arrival date & time: 01/28/23  1213     History {Add pertinent medical, surgical, social history, OB history to HPI:1} Chief Complaint  Patient presents with   Back Pain   Hypertension    Shantil Wootan is a 52 y.o. female.  52 year old female with a history of CHF, CKD, CAD, hypertension, and intellectual disability who presents to the emergency department with back pain.  Patient reports that on Wednesday night she was seated and felt her back crack.  Says that since then she has been having pain.  Has been trying some exercises for her back but is been having persistent pain.  Thinks it may be related to her CKD but is unable to express why.  Says that it is upper lumbar region and 9/10 in severity.  Has difficulty characterizing the pain.  No bowel or bladder incontinence.  No lower extremity weakness or numbness.  No changes in sensation with wiping.  Not on any blood thinners.  No history of indwelling catheters or IV drug use.  No fevers.       Home Medications Prior to Admission medications   Medication Sig Start Date End Date Taking? Authorizing Provider  amLODipine (NORVASC) 5 MG tablet Take 1 tablet (5 mg total) by mouth daily. Patient taking differently: Take 5 mg by mouth See admin instructions. Take two tablets by mouth each morning and take one tablet by mouth every evening 04/12/20 07/26/21  Chrystie Nose, MD  aspirin EC 81 MG EC tablet Take 1 tablet (81 mg total) by mouth daily. Swallow whole. 12/23/19   Jerald Kief, MD  carvedilol (COREG) 6.25 MG tablet Take 1 tablet (6.25 mg total) by mouth 2 (two) times daily with a meal. 06/28/21 07/28/21  Joylene Grapes, NP  furosemide (LASIX) 20 MG tablet Take 2 tablets (40 mg total) by mouth daily. 02/17/20 07/26/21  Ronney Asters, NP  glimepiride (AMARYL) 2 MG tablet Take 2 mg by mouth daily.    [provider]  hydrALAZINE  (APRESOLINE) 50 MG tablet Take 1 tablet (50 mg total) by mouth every 8 (eight) hours. 12/22/19 07/26/21  Jerald Kief, MD  hydrochlorothiazide (HYDRODIURIL) 50 MG tablet Take 1 tablet (50 mg total) by mouth 2 (two) times daily. 06/28/21   Joylene Grapes, NP  HYDROcodone-acetaminophen (NORCO/VICODIN) 5-325 MG tablet Take 1 tablet by mouth every 6 (six) hours as needed. 05/11/20   Gailen Shelter, PA  isosorbide mononitrate (IMDUR) 120 MG 24 hr tablet Take 1 tablet (120 mg total) by mouth daily. 12/23/19 07/26/21  Jerald Kief, MD  metformin (FORTAMET) 500 MG (OSM) 24 hr tablet Take 500 mg by mouth in the morning and at bedtime.     [provider]  rosuvastatin (CRESTOR) 10 MG tablet Take 10 mg by mouth at bedtime. 06/06/21   [provider]      Allergies    Patient has no known allergies.    Review of Systems   Review of Systems  Physical Exam Updated Vital Signs BP (!) 209/145   Pulse 96   Temp 97.6 F (36.4 C) (Oral)   Resp 17   Ht 5\' 2"  (1.575 m)   Wt 90.9 kg   LMP 12/28/2022   SpO2 92%   BMI 36.65 kg/m  Physical Exam Vitals and nursing note reviewed.  Constitutional:      General: She is not in acute  distress.    Appearance: She is well-developed.  HENT:     Head: Normocephalic and atraumatic.     Right Ear: External ear normal.     Left Ear: External ear normal.     Nose: Nose normal.  Eyes:     Extraocular Movements: Extraocular movements intact.     Conjunctiva/sclera: Conjunctivae normal.     Pupils: Pupils are equal, round, and reactive to light.  Cardiovascular:     Rate and Rhythm: Normal rate and regular rhythm.  Pulmonary:     Effort: Pulmonary effort is normal. No respiratory distress.  Abdominal:     General: Abdomen is flat. There is no distension.     Palpations: Abdomen is soft. There is no mass (No pulsatile abdominal masses).     Tenderness: There is no abdominal tenderness. There is no right CVA tenderness, left CVA tenderness or  guarding.  Musculoskeletal:     Cervical back: Normal range of motion and neck supple.     Right lower leg: No edema.     Left lower leg: No edema.  Skin:    General: Skin is warm and dry.  Neurological:     Mental Status: She is alert and oriented to person, place, and time. Mental status is at baseline.     Comments: No spinal midline TTP in cervical, thoracic, or lumbar spine. No stepoffs noted.   Motor: Muscle bulk and tone are normal. Strength is 5/5 in hip flexion, knee flexion and extension, ankle dorsiflexion and plantar flexion bilaterally. Full strength of great toe dorsiflexion bilaterally.  Sensory: Intact sensation to light touch in L2 though S1 dermatomes bilaterally.   Psychiatric:        Mood and Affect: Mood normal.     ED Results / Procedures / Treatments   Labs (all labs ordered are listed, but only abnormal results are displayed) Labs Reviewed  URINALYSIS, ROUTINE W REFLEX MICROSCOPIC  HCG, SERUM, QUALITATIVE  CBC  BASIC METABOLIC PANEL    EKG None  Radiology No results found.  Procedures Procedures  {Document cardiac monitor, telemetry assessment procedure when appropriate:1}  Medications Ordered in ED Medications - No data to display  ED Course/ Medical Decision Making/ A&P Clinical Course as of 01/28/23 2044  Mon Jan 28, 2023  1522 Creatinine(!): 3.93 Baseline of 1.7 [RP]  1727 Thinks she was hospitalized in march [id number] simone [RP]  1759 Admitted to medicine [HN]    Clinical Course User Index [HN] Loetta Rough, MD [RP] Rondel Baton, MD   {   Click here for ABCD2, HEART and other calculatorsREFRESH Note before signing :1}                              Medical Decision Making Amount and/or Complexity of Data Reviewed Labs: ordered. Decision-making details documented in ED Course. Radiology: ordered.  Risk Prescription drug management. Decision regarding hospitalization.   ***  {Document critical care time when  appropriate:1} {Document review of labs and clinical decision tools ie heart score, Chads2Vasc2 etc:1}  {Document your independent review of radiology images, and any outside records:1} {Document your discussion with family members, caretakers, and with consultants:1} {Document social determinants of health affecting pt's care:1} {Document your decision making why or why not admission, treatments were needed:1} Final Clinical Impression(s) / ED Diagnoses Final diagnoses:  None    Rx / DC Orders ED Discharge Orders     None

## 2023-01-28 NOTE — ED Provider Notes (Signed)
6:00 PM Assumed care of patient from off-going team. For more details, please see note from same day.  In brief, this is a 52 y.o. female w/ CHF, CKD, CAD, hypertension, and intellectual disability who p/w back pain. US aorta, CT abd pelvis wo contrast demonstrates small pleural effusion ,small pericardial effusion, perinephric stranding. UA w/o infection. Cr however w/ baseline 0.7 now with 3.93. No prior Cr's between 2021 and now. Unclear history, patient doesn't know what her Cr usually is. BP was >200 initially, now 178/11 with labetalol 20 mg IV.   Plan/Dispo at time of sign-out & ED Course since sign-out: [ ]  admit for [facility] emergency  BP (!) 178/111   Pulse 78   Temp 97.7 F (36.5 C)   Resp 18   Ht 5\' 2"  (1.575 m)   Wt 90.9 kg   LMP 12/28/2022   SpO2 93%   BMI 36.65 kg/m    ED Course:   Clinical Course as of 01/28/23 1800  Mon Jan 28, 2023  1522 Creatinine(!): 3.93 Baseline of 1.7 [RP]  1727 Thinks she was hospitalized in march [id number] simone [RP]  1759 Admitted to medicine [HN]    Clinical Course User Index [HN] Loetta Rough, MD [RP] Rondel Baton, MD    Dispo: Admit ------------------------------- Vivi Barrack, MD Emergency Medicine  This note was created using dictation software, which may contain spelling or grammatical errors.   Loetta Rough, MD 01/28/23 1800

## 2023-01-28 NOTE — H&P (Addendum)
History and Physical    Patient: Joanna [name] DGU:[id number] DOB: 1970-11-30 DOA: 01/28/2023 DOS: the patient was seen and examined on 01/28/2023 PCP: Hillery Aldo, NP  Patient coming from: Home  Chief Complaint:  Chief Complaint  Patient presents with   Back Pain   Hypertension   HPI: Joanna [name] is a 52 y.o. female with an intellectual disability.  She is not able to provide any information about her medical history or medications.  She is able to tell me that she came to the hospital today because of severe back pain which has been going on for 5 days.  Because the pain was not getting any better someone in her family thought perhaps she was having a kidney stone so they sent her in for evaluation.  Her records reveal that she has a history of diabetes mellitus type 2, acute kidney injury, hypertension, diastolic congestive heart failure, and obesity.  She also tells me that she had gout recently. Her workup in the emergency department included a CT without contrast that did not reveal any aortic dissection or hydronephrosis.  The patient did also tell me that she took her blood pressure medications this morning and takes them every day.  Her back pain does not appear to be severe she has no point tenderness or numbness or tingling and it seems to be worse when she twists. She will be admitted for blood pressure control and to monitor her creatinine. Last creatinine in the chart was 1.76 and that was 3 years ago.    Review of Systems: As mentioned in the history of present illness. All other systems reviewed and are negative. Past Medical History:  Diagnosis Date   Acute diastolic heart failure (HCC) 12/16/2019   Acute respiratory failure with hypoxia (HCC) 12/16/2019   AKI (acute kidney injury) (HCC) 12/16/2019   Anasarca 12/16/2019   CAD (coronary artery disease)    Diabetes mellitus without complication (HCC)    HTN (hypertension) 12/16/2019   Hypertension    Hypertensive urgency  12/16/2019   Intellectual disability 12/16/2019   Morbid obesity (HCC)    New onset of congestive heart failure (HCC) 12/16/2019   Obesity, Class III, BMI 40-49.9 (morbid obesity) (HCC) 12/16/2019   Type 2 diabetes mellitus without complication (HCC) 12/16/2019   History reviewed. No pertinent surgical history. Social History:  reports that she has never smoked. She has never used smokeless tobacco. She reports that she does not currently use alcohol. She reports that she does not use drugs.  No Known Allergies  Family History  Problem Relation Age of Onset   Heart disease Mother        Enlarged heart per patient   Hypertension Mother    Diabetes Mother    Hypertension Sister    Diabetes Sister     Prior to Admission medications   Medication Sig Start Date End Date Taking? Authorizing Provider  amLODipine (NORVASC) 5 MG tablet Take 1 tablet (5 mg total) by mouth daily. Patient taking differently: Take 5 mg by mouth See admin instructions. Take two tablets by mouth each morning and take one tablet by mouth every evening 04/12/20 07/26/21  Chrystie Nose, MD  aspirin EC 81 MG EC tablet Take 1 tablet (81 mg total) by mouth daily. Swallow whole. 12/23/19   Jerald Kief, MD  carvedilol (COREG) 6.25 MG tablet Take 1 tablet (6.25 mg total) by mouth 2 (two) times daily with a meal. 06/28/21 07/28/21  Joylene Grapes, NP  furosemide (  LASIX) 20 MG tablet Take 2 tablets (40 mg total) by mouth daily. 02/17/20 07/26/21  Ronney Asters, NP  glimepiride (AMARYL) 2 MG tablet Take 2 mg by mouth daily.    [provider]  hydrALAZINE (APRESOLINE) 50 MG tablet Take 1 tablet (50 mg total) by mouth every 8 (eight) hours. 12/22/19 07/26/21  Jerald Kief, MD  hydrochlorothiazide (HYDRODIURIL) 50 MG tablet Take 1 tablet (50 mg total) by mouth 2 (two) times daily. 06/28/21   Joylene Grapes, NP  HYDROcodone-acetaminophen (NORCO/VICODIN) 5-325 MG tablet Take 1 tablet by mouth every 6 (six) hours as needed.  05/11/20   Gailen Shelter, PA  isosorbide mononitrate (IMDUR) 120 MG 24 hr tablet Take 1 tablet (120 mg total) by mouth daily. 12/23/19 07/26/21  Jerald Kief, MD  metformin (FORTAMET) 500 MG (OSM) 24 hr tablet Take 500 mg by mouth in the morning and at bedtime.     [provider]  rosuvastatin (CRESTOR) 10 MG tablet Take 10 mg by mouth at bedtime. 06/06/21   [provider]    Physical Exam: Vitals:   01/28/23 1627 01/28/23 1823 01/28/23 1830 01/28/23 1835  BP:  (!) 169/128 (!) 189/102 (!) 178/107  Pulse:  78 82 82  Resp:   18 16  Temp: 97.7 F (36.5 C)     TempSrc:      SpO2:  94% 90% 93%  Weight:      Height:       Physical Exam:  General: No acute distress, obese, well nourished HEENT: Normocephalic, atraumatic, PERRL Cardiovascular: Normal rate and rhythm. Distal pulses intact. Pulmonary: Normal pulmonary effort, normal breath sounds Gastrointestinal: Nondistended abdomen, obese, soft, non-tender, normoactive bowel sounds, no organomegaly Musculoskeletal:Normal ROM, no lower ext edema Lymphadenopathy: No cervical LAD. Skin: Skin is warm and dry. Neuro: No focal deficits noted, AAOx3. PSYCH: Attentive and cooperative  Data Reviewed:  Results for orders placed or performed during the hospital encounter of 01/28/23 (from the past 24 hour(s))  hCG, serum, qualitative     Status: None   Collection Time: 01/28/23  2:36 PM  Result Value Ref Range   Preg, Serum NEGATIVE NEGATIVE  CBC     Status: Abnormal   Collection Time: 01/28/23  2:36 PM  Result Value Ref Range   WBC 3.9 (L) 4.0 - 10.5 K/uL   RBC 3.99 3.87 - 5.11 MIL/uL   Hemoglobin 10.7 (L) 12.0 - 15.0 g/dL   HCT 82.9 (L) 56.2 - 13.0 %   MCV 86.2 80.0 - 100.0 fL   MCH 26.8 26.0 - 34.0 pg   MCHC 31.1 30.0 - 36.0 g/dL   RDW 86.5 (H) 78.4 - 69.6 %   Platelets 286 150 - 400 K/uL   nRBC 0.0 0.0 - 0.2 %  Basic metabolic panel     Status: Abnormal   Collection Time: 01/28/23  2:36 PM  Result Value  Ref Range   Sodium 139 135 - 145 mmol/L   Potassium 4.2 3.5 - 5.1 mmol/L   Chloride 108 98 - 111 mmol/L   CO2 20 (L) 22 - 32 mmol/L   Glucose, Bld 89 70 - 99 mg/dL   BUN 43 (H) 6 - 20 mg/dL   Creatinine, Ser 2.95 (H) 0.44 - 1.00 mg/dL   Calcium 9.1 8.9 - 28.4 mg/dL   GFR, Estimated 13 (L) >60 mL/min   Anion gap 11 5 - 15  CBG monitoring, ED     Status: Abnormal   Collection Time:  01/28/23  2:51 PM  Result Value Ref Range   Glucose-Capillary 66 (L) 70 - 99 mg/dL  CBG monitoring, ED     Status: None   Collection Time: 01/28/23  3:44 PM  Result Value Ref Range   Glucose-Capillary 73 70 - 99 mg/dL  Urinalysis, Routine w reflex microscopic -Urine, Clean Catch     Status: Abnormal   Collection Time: 01/28/23  4:29 PM  Result Value Ref Range   Color, Urine YELLOW YELLOW   APPearance HAZY (A) CLEAR   Specific Gravity, Urine 1.017 1.005 - 1.030   pH 5.0 5.0 - 8.0   Glucose, UA 50 (A) NEGATIVE mg/dL   Hgb urine dipstick NEGATIVE NEGATIVE   Bilirubin Urine NEGATIVE NEGATIVE   Ketones, ur NEGATIVE NEGATIVE mg/dL   Protein, ur >=161 (A) NEGATIVE mg/dL   Nitrite NEGATIVE NEGATIVE   Leukocytes,Ua NEGATIVE NEGATIVE   RBC / HPF 0-5 0 - 5 RBC/hpf   WBC, UA 0-5 0 - 5 WBC/hpf   Bacteria, UA NONE SEEN NONE SEEN   Squamous Epithelial / HPF 0-5 0 - 5 /HPF   Mucus PRESENT      Assessment and Plan: Hypertensive urgency - Home meds have not been verified but we will restart all the medication on the list that we have with 1 exception, will add Isordil instead of Imdur for better blood pressure control.  The patient reports that she did take blood pressure medication this morning and she takes it every day.  Monitor her blood pressure as her back pain is controlled 2.  Acute versus chronic renal failure - Cr was 1.76 three years ago.  IV fluids and monitor. Hold Lasix.  Consider nephrology consult 3.  Back pain - pain medication as needed.  Her CT does not reveal a kidney stone.  Musculoskeletal  pain is suspected. 4.  Type 2 diabetes mellitus - med list has not been verified.  Cont Amaryl.  Add corrective dose insulin   Advance Care Planning:   Code Status: Full Code  She will be full code by default as she does not have capacity.  Consults: consider nephrology  Family Communication: none  Severity of Illness: The appropriate patient status for this patient is INPATIENT. Inpatient status is judged to be reasonable and necessary in order to provide the required intensity of service to ensure the patient's safety. The patient's presenting symptoms, physical exam findings, and initial radiographic and laboratory data in the context of their chronic comorbidities is felt to place them at high risk for further clinical deterioration. Furthermore, it is not anticipated that the patient will be medically stable for discharge from the hospital within 2 midnights of admission.   * I certify that at the point of admission it is my clinical judgment that the patient will require inpatient hospital care spanning beyond 2 midnights from the point of admission due to high intensity of service, high risk for further deterioration and high frequency of surveillance required.*  Author: Buena Irish, MD 01/28/2023 7:02 PM  For on call review [email or web address].

## 2023-01-29 ENCOUNTER — Other Ambulatory Visit: Payer: Self-pay

## 2023-01-29 DIAGNOSIS — I160 Hypertensive urgency: Secondary | ICD-10-CM | POA: Diagnosis not present

## 2023-01-29 LAB — GLUCOSE, CAPILLARY
Glucose-Capillary: 112 mg/dL — ABNORMAL HIGH (ref 70–99)
Glucose-Capillary: 122 mg/dL — ABNORMAL HIGH (ref 70–99)
Glucose-Capillary: 97 mg/dL (ref 70–99)

## 2023-01-29 MED ORDER — MUPIROCIN 2 % EX OINT
TOPICAL_OINTMENT | Freq: Two times a day (BID) | CUTANEOUS | Status: DC
  Administered 2023-01-29 – 2023-02-03 (×5): 1 via NASAL
  Filled 2023-01-29 (×4): qty 22

## 2023-01-29 MED ORDER — MUSCLE RUB 10-15 % EX CREA
1.0000 | TOPICAL_CREAM | CUTANEOUS | Status: DC | PRN
  Administered 2023-01-29 – 2023-01-31 (×6): 1 via TOPICAL
  Filled 2023-01-29: qty 85

## 2023-01-29 MED ORDER — SODIUM CHLORIDE 0.9 % IV SOLN: INTRAVENOUS | Status: DC

## 2023-01-29 NOTE — Progress Notes (Signed)
Pt walked 380ft around the unit. Towards the end of the walk, she became noticeably SOB and her spo2 dropped to 86%. Upon returning to the room, pt was placed on 2L [place] and MD was notified.

## 2023-01-29 NOTE — Care Plan (Signed)
Was able to talk to her sister and niece who had adopted her.  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  Randa Evens has to find a new place so in the meantime patient is in [place] with her sister.  Patient does have history of underlying CKD, recent labs are unknown.  She does use nocturnal oxygen.  Will monitor patient today, check for nocturnal oxygen for prescribing supplemental oxygen.  Anticipate home tomorrow if reasonably stabilized.

## 2023-01-29 NOTE — Evaluation (Signed)
Occupational Therapy Evaluation Patient Details Name: Joanna [name] MRN: [id number] DOB: 1971-01-13 Today's Date: 01/29/2023   History of Present Illness Patient is a 52 year old female who presented from home with 5 day history of back pain. Patient was admitted with hypertensive urgency, AKI and back pain. PMH: type II DM, intellectual disability, obesity.   Clinical Impression   Patient evaluated by Occupational Therapy with no further acute OT needs identified. All education has been completed and the patient has no further questions. Patient is supervision with ADLs at this time and appears to be at baseline. See below for any follow-up Occupational Therapy or equipment needs. OT is signing off. Thank you for this referral.        If plan is discharge home, recommend the following: Assistance with cooking/housework;Direct supervision/assist for medications management;Assist for transportation;Help with stairs or ramp for entrance;Direct supervision/assist for financial management;Supervision due to cognitive status    Functional Status Assessment  Patient has had a recent decline in their functional status and demonstrates the ability to make significant improvements in function in a reasonable and predictable amount of time.  Equipment Recommendations  None recommended by OT       Precautions / Restrictions Precautions Precautions: Fall Restrictions Weight Bearing Restrictions: No      Mobility Bed Mobility Overal bed mobility: Modified Independent                               Balance Overall balance assessment: Mild deficits observed, not formally tested       ADL either performed or assessed with clinical judgement   ADL Overall ADL's : At baseline           General ADL Comments: patient is supervision for ADLs at this time with patient able to complete LB dressing/bathing simulated tasks.  patient completed bed mobility with supevision. patient  completed toileting with supervision for safety. patient indicated that family helps her when she needs it at home. patient walked around the ICU unit with nursing x2 prior to this session     Vision Patient Visual Report: No change from baseline              Pertinent Vitals/Pain Pain Assessment Pain Assessment: 0-10 Pain Score: 5  Pain Location: L side of back Pain Descriptors / Indicators: Discomfort, Grimacing Pain Intervention(s): Limited activity within patient's tolerance, Monitored during session, Repositioned     Extremity/Trunk Assessment Upper Extremity Assessment Upper Extremity Assessment: Overall WFL for tasks assessed   Lower Extremity Assessment Lower Extremity Assessment: Defer to PT evaluation          Cognition Arousal: Alert Behavior During Therapy: [facility] for tasks assessed/performed Overall Cognitive Status: No family/caregiver present to determine baseline cognitive functioning             General Comments: patient was plesant and cooperative during session. was noted to repeat therapists statements back in patients words.                Home Living Family/patient expects to be discharged to:: Private residence Living Arrangements: Other relatives Available Help at Discharge: Family;Available PRN/intermittently Type of Home: House       Additional Comments: patient reported that she lives in [place] with her sister and sisters children. patient reported that her sisters children take care of her. patient unable to give info on house set up as patient is currently in [place] at this time.  Prior Functioning/Environment Prior Level of Function : Independent/Modified Independent               ADLs Comments: patient reported that family helps with what she needs. "sometimes i ask them to do more than i need them too"                 OT Goals(Current goals can be found in the care plan section) Acute Rehab OT Goals OT  Goal Formulation: All assessment and education complete, DC therapy  OT Frequency:         AM-PAC OT "6 Clicks" Daily Activity     Outcome Measure Help from another person eating meals?: None Help from another person taking care of personal grooming?: None Help from another person toileting, which includes using toliet, bedpan, or urinal?: A Little Help from another person bathing (including washing, rinsing, drying)?: A Little Help from another person to put on and taking off regular upper body clothing?: A Little Help from another person to put on and taking off regular lower body clothing?: A Little 6 Click Score: 20   End of Session Equipment Utilized During Treatment: Gait belt Nurse Communication: Other (comment) (ok to participate)  Activity Tolerance: Patient tolerated treatment well Patient left: in bed;with call bell/phone within reach  OT Visit Diagnosis: Unsteadiness on feet (R26.81)                Time: 1450-1509 OT Time Calculation (min): 19 min Charges:  OT General Charges $OT Visit: 1 Visit OT Evaluation $OT Eval Low Complexity: 1 Low   OTR/L, MS Acute Rehabilitation Department Office# [phone]   Selinda Flavin 01/29/2023, 4:43 PM

## 2023-01-29 NOTE — Progress Notes (Signed)
Overnight Pulse Oximetry study started on patient. Patient on room air at this time.

## 2023-01-29 NOTE — Progress Notes (Signed)
PROGRESS NOTE    Joanna [name]  NWG:[id number] DOB: 1970-09-20 DOA: 01/28/2023 PCP: Hillery Aldo, NP    Brief Narrative:  52 year old with intellectual disability, type 2 diabetes, CKD and recently status unknown, hypertension, obesity presented from home with 5 days of back pain.  Poor historian.  No collateral available.  Patient reports back pain started when she was reaching for her remote and twisted her back.  Patient reports visiting [place] with her sister and niece. In the emergency room CT scan abdomen pelvis was done without evidence of hydronephrosis or urinary retention, without evidence of aortic dissection.  Blood pressures were elevated.  Creatinine was 3.8, last known creatinine 1.  7 6 more than 3 years ago.  Admitted with AKI, back pain.   Assessment & Plan:   Hypertensive urgency: Blood pressures better after starting amlodipine, Coreg, hydralazine and Isordil.  Monitor.  Keep on as needed medications.  Will need new medication regimen on discharge.  AKI in a patient with CKD, recent labs unknown. No evidence of uremia. Last known creatinine 1.76, No evidence of hydronephrosis or urinary retention on her abdominal CT scan. Continue maintenance IV fluids.  Intake output monitoring.  Check postvoid residuals.  Recheck levels tomorrow morning.  She likely has progressive kidney disease.  Back pain: Probably musculoskeletal.  Symptomatic management.  Lidocaine patches.  Pain medications.  Type 2 diabetes with hypoglycemia: On Amaryl at home.  Discontinued due to blood sugars less than 60.  Currently on sliding scale insulin.     DVT prophylaxis: heparin injection 5,000 Units Start: 01/29/23 0600   Code Status: Full code Family Communication: Unable, both numbers on the chart available for her contact is not in service.  Hopefully her family will try to reach. Disposition Plan: Status is: Inpatient Remains inpatient appropriate because: Significant renal function  dysfunction, IV fluids and mobility     Consultants:  None  Procedures:  None  Antimicrobials:  None   Subjective: Patient seen and examined.  Poor historian.  Still has some lower back pain but denies any other complaints.  Objective: Vitals:   01/29/23 0730 01/29/23 0800 01/29/23 0814 01/29/23 1022  BP: (!) 170/97  (!) 164/89 (!) 155/87  Pulse: 81  88   Resp: 16     Temp:  (!) 97.5 F (36.4 C)    TempSrc:  Oral    SpO2: 100%     Weight:      Height:        Intake/Output Summary (Last 24 hours) at 01/29/2023 1027 Last data filed at 01/28/2023 2300 Gross per 24 hour  Intake 240 ml  Output --  Net 240 ml   Filed Weights   01/28/23 1239  Weight: 90.9 kg    Examination:  General exam: Appears calm and comfortable  Patient is alert awake and oriented to herself.  She is oriented to situation.  Not oriented to time and place. Flat affect.  Interactive and appropriately answers questions. Respiratory system: Clear to auscultation. Respiratory effort normal. Cardiovascular system: S1 & S2 heard, RRR. No JVD, murmurs, rubs, gallops or clicks. No pedal edema. Gastrointestinal system: Abdomen is nondistended, soft and nontender. No organomegaly or masses felt. Normal bowel sounds heard.    Data Reviewed: I have personally reviewed following labs and imaging studies  CBC: Recent Labs  Lab 01/28/23 1436 01/29/23 0320  WBC 3.9* 4.7  HGB 10.7* 9.3*  HCT 34.4* 30.5*  MCV 86.2 89.2  PLT 286 258   Basic Metabolic Panel: Recent  Labs  Lab 01/28/23 1436 01/29/23 0320  NA 139 139  K 4.2 5.0  CL 108 109  CO2 20* 20*  GLUCOSE 89 119*  BUN 43* 43*  CREATININE 3.93* 3.84*  CALCIUM 9.1 8.3*   GFR: Estimated Creatinine Clearance: 18.4 mL/min (A) (by C-G formula based on SCr of 3.84 mg/dL (H)). Liver Function Tests: No results for input(s): "AST", "ALT", "ALKPHOS", "BILITOT", "PROT", "ALBUMIN" in the last 168 hours. No results for input(s): "LIPASE", "AMYLASE"  in the last 168 hours. No results for input(s): "AMMONIA" in the last 168 hours. Coagulation Profile: No results for input(s): "INR", "PROTIME" in the last 168 hours. Cardiac Enzymes: No results for input(s): "CKTOTAL", "CKMB", "CKMBINDEX", "TROPONINI" in the last 168 hours. BNP (last 3 results) No results for input(s): "PROBNP" in the last 8760 hours. HbA1C: No results for input(s): "HGBA1C" in the last 72 hours. CBG: Recent Labs  Lab 01/28/23 1451 01/28/23 1544 01/28/23 2006 01/28/23 2308 01/29/23 0728  GLUCAP 66* 73 114* 88 112*   Lipid Profile: No results for input(s): "CHOL", "HDL", "LDLCALC", "TRIG", "CHOLHDL", "LDLDIRECT" in the last 72 hours. Thyroid Function Tests: No results for input(s): "TSH", "T4TOTAL", "FREET4", "T3FREE", "THYROIDAB" in the last 72 hours. Anemia Panel: No results for input(s): "VITAMINB12", "FOLATE", "FERRITIN", "TIBC", "IRON", "RETICCTPCT" in the last 72 hours. Sepsis Labs: No results for input(s): "PROCALCITON", "LATICACIDVEN" in the last 168 hours.  Recent Results (from the past 240 hour(s))  MRSA Next Gen by PCR, Nasal     Status: Abnormal   Collection Time: 01/28/23 10:50 PM   Specimen: Nasal Mucosa; Nasal Swab  Result Value Ref Range Status   MRSA by PCR Next Gen DETECTED (A) NOT DETECTED Final    Comment: (NOTE) The GeneXpert MRSA Assay (FDA approved for NASAL specimens only), is one component of a comprehensive MRSA colonization surveillance program. It is not intended to diagnose MRSA infection nor to guide or monitor treatment for MRSA infections. Test performance is not FDA approved in patients less than 19 years old. Performed at [facility], [address], [place], [place] [address]          Radiology Studies: US Aorta  Result Date: 01/28/2023 CLINICAL DATA:  Back pain. EXAM: ULTRASOUND OF ABDOMINAL AORTA TECHNIQUE: Ultrasound examination of the abdominal aorta and proximal common iliac arteries was  performed to evaluate for aneurysm. Additional color and Doppler images of the distal aorta were obtained to document patency. COMPARISON:  CT 01/28/2023 FINDINGS: Abdominal aortic measurements as follows: Proximal:  2.3 x 2.4 cm Mid:  1.9 x 2.3 cm Distal: Obscured by bowel gas Iliac vessels are obscured by bowel gas. Note is made of a umbilical hernia with some fluid. IMPRESSION: Partially obscured by overlapping bowel gas. What is seen is nonaneurysmal. Please correlate with the prior CT Electronically Signed   By: Karen Kays M.D.   On: 01/28/2023 16:55   CT ABDOMEN PELVIS WO CONTRAST  Result Date: 01/28/2023 CLINICAL DATA:  Back pain, AKI EXAM: CT ABDOMEN AND PELVIS WITHOUT CONTRAST TECHNIQUE: Multidetector CT imaging of the abdomen and pelvis was performed following the standard protocol without IV contrast. RADIATION DOSE REDUCTION: This exam was performed according to the departmental dose-optimization program which includes automated exposure control, adjustment of the mA and/or kV according to patient size and/or use of iterative reconstruction technique. COMPARISON:  Renal ultrasound 12/18/2019 FINDINGS: Lower chest: Small right pleural effusion. Mild interlobular septal thickening and mild ground-glass opacities likely due to edema. Cardiomegaly. Small pericardial effusion.  Hepatobiliary: Unremarkable noncontrast appearance of the liver. Gallbladder and biliary tree are unremarkable. Pancreas: Unremarkable. Spleen: Unremarkable. Adrenals/Urinary Tract: Unremarkable adrenal glands. Nonspecific bilateral perinephric stranding. No urinary calculi or hydronephrosis. Unremarkable bladder. Stomach/Bowel: Normal caliber large and small bowel. Normal appendix. Stomach is within normal limits. Vascular/Lymphatic: Aortic atherosclerosis. No enlarged abdominal or pelvic lymph nodes. Reproductive: Globular enlargement of the uterus likely due to fibroids. No adnexal mass. Other: Fat containing periumbilical  hernia. Diffuse body wall edema. No free intraperitoneal fluid or air. Musculoskeletal: No acute fracture. Bilateral L5 pars defects with grade 1 anterolisthesis of L5 on S1. Advanced disc space height loss at L5-S1. IMPRESSION: 1. Small right pleural effusion. Mild interlobular septal thickening and mild ground-glass opacities likely due to edema. 2. Cardiomegaly with small pericardial effusion. 3. No definite acute abnormality in the abdomen or pelvis. Bilateral perinephric stranding is nonspecific and may be normal or due to edema. Pyelonephritis is difficult to exclude without IV contrast. 4. Chronic bilateral L5 spondylolysis with grade 1 spondylolisthesis. 5. Fibroid uterus. Aortic Atherosclerosis (ICD10-I70.0). Electronically Signed   By: Minerva Fester M.D.   On: 01/28/2023 16:23        Scheduled Meds:  amLODipine  10 mg Oral Daily   carvedilol  6.25 mg Oral BID WC   Chlorhexidine Gluconate Cloth  6 each Topical QHS   heparin  5,000 Units Subcutaneous Q8H   hydrALAZINE  50 mg Oral Q8H   insulin aspart  0-6 Units Subcutaneous TID WC   isosorbide dinitrate  10 mg Oral TID   lidocaine  1 patch Transdermal Q24H   mupirocin ointment   Nasal BID   Continuous Infusions:  sodium chloride       LOS: 1 day    Time spent: 35 minutes    Dorcas Carrow, MD [facility]

## 2023-01-29 NOTE — Progress Notes (Signed)
TOC attempted to call contact for collateral. Numbers listed in chart are out of service. No needs identified at the time of screening.

## 2023-01-29 NOTE — Inpatient Diabetes Management (Signed)
Inpatient Diabetes Program Recommendations  AACE/ADA: New Consensus Statement on Inpatient Glycemic Control   Target Ranges:  Prepandial:   less than 140 mg/dL      Peak postprandial:   less than 180 mg/dL (1-2 hours)      Critically ill patients:  140 - 180 mg/dL    Latest Reference Range & Units 01/28/23 14:51 01/28/23 15:44 01/28/23 20:06 01/28/23 23:08 01/29/23 07:28  Glucose-Capillary 70 - 99 mg/dL 66 (L) 73 027 (H) 88 253 (H)   Review of Glycemic Control  Diabetes history: DM2 Outpatient Diabetes medications: Amaryl 2 mg daily, Fortamet 500 mg BID Current orders for Inpatient glycemic control: Novolog 0-6 units TID with meals, Amaryl 2 mg daily  Inpatient Diabetes Program Recommendations:    Oral DM medication: Initial CBG 66 mg/dl on 6/64/40.  Please consider discontinuing Amaryl while inpatient.   Thanks, Orlando Penner, RN, MSN, CDCES Diabetes Coordinator Inpatient Diabetes Program [phone] (Team Pager from 8am to 5pm)

## 2023-01-30 ENCOUNTER — Inpatient Hospital Stay (HOSPITAL_COMMUNITY): Payer: Medicare HMO

## 2023-01-30 ENCOUNTER — Encounter (HOSPITAL_COMMUNITY): Payer: Self-pay | Admitting: Internal Medicine

## 2023-01-30 DIAGNOSIS — I160 Hypertensive urgency: Secondary | ICD-10-CM | POA: Diagnosis not present

## 2023-01-30 DIAGNOSIS — I6523 Occlusion and stenosis of bilateral carotid arteries: Secondary | ICD-10-CM

## 2023-01-30 LAB — BASIC METABOLIC PANEL
Anion gap: 8 (ref 5–15)
BUN: 36 mg/dL — ABNORMAL HIGH (ref 6–20)
CO2: 17 mmol/L — ABNORMAL LOW (ref 22–32)
Calcium: 7.1 mg/dL — ABNORMAL LOW (ref 8.9–10.3)
Chloride: 113 mmol/L — ABNORMAL HIGH (ref 98–111)
Creatinine, Ser: 3.59 mg/dL — ABNORMAL HIGH (ref 0.44–1.00)
GFR, Estimated: 15 mL/min — ABNORMAL LOW (ref >60)
Glucose, Bld: 110 mg/dL — ABNORMAL HIGH (ref 70–99)
Potassium: 4.2 mmol/L (ref 3.5–5.1)
Sodium: 138 mmol/L (ref 135–145)

## 2023-01-30 LAB — GLUCOSE, CAPILLARY
Glucose-Capillary: 85 mg/dL (ref 70–99)
Glucose-Capillary: 106 mg/dL — ABNORMAL HIGH (ref 70–99)
Glucose-Capillary: 117 mg/dL — ABNORMAL HIGH (ref 70–99)
Glucose-Capillary: 107 mg/dL — ABNORMAL HIGH (ref 70–99)
Glucose-Capillary: 134 mg/dL — ABNORMAL HIGH (ref 70–99)

## 2023-01-30 LAB — AMMONIA: Ammonia: <10 umol/L (ref 9–35)

## 2023-01-30 MED ORDER — ASPIRIN 300 MG RE SUPP
300.0000 mg | Freq: Every day | RECTAL | Status: DC
  Filled 2023-01-30: qty 1

## 2023-01-30 MED ORDER — STROKE: EARLY STAGES OF RECOVERY BOOK
Freq: Once | Status: AC
  Filled 2023-01-30: qty 1

## 2023-01-30 MED ORDER — CLOPIDOGREL BISULFATE 75 MG PO TABS
75.0000 mg | ORAL_TABLET | Freq: Every day | ORAL | Status: DC
  Administered 2023-01-30 – 2023-02-04 (×6): 75 mg via ORAL
  Filled 2023-01-30 (×6): qty 1

## 2023-01-30 MED ORDER — ASPIRIN 325 MG PO TABS
325.0000 mg | ORAL_TABLET | Freq: Every day | ORAL | Status: DC
  Administered 2023-01-30 – 2023-02-04 (×6): 325 mg via ORAL
  Filled 2023-01-30 (×6): qty 1

## 2023-01-30 MED ORDER — LORAZEPAM 2 MG/ML IJ SOLN
1.0000 mg | Freq: Once | INTRAMUSCULAR | Status: AC
  Administered 2023-01-30: 1 mg via INTRAVENOUS
  Filled 2023-01-30: qty 1

## 2023-01-30 NOTE — Significant Event (Signed)
Rapid Response Event Note   Reason for Call :  Change in speech   Initial Focused Assessment:  Alerted by staff that patient has had change in speech. Per nurse, this morning she was alert and oriented x4, able to carry conversation with no deficits. On assessment patient repeating same word as if she was having conversation with staff. Repeating "Social security social security" multiple times even when staff attempts to redirect. When asked to identify her phone she initially could not and stated social security. Able to identify tape and pen after correction. Grip equal in both extremities, able to move all four extremities equal, no weakness noted. smile equal, able to stick tongue out and follow commands. CBG 106.   During CT recall appears to have improved compared to initial assessment on floor while tele neuro MD was completing assessment. Was able to recall objects easier but had issues following some commands. Patient states she is not able to read but was able to spell out each word on the NIH sheets. When asked to describe picture of mother washing dishes, she correctly identified dishes being washed and stated "he is grabbing a plate".   Taken to CT by staff and tele neuro MD notified. Transported to MRI after CT scan per order. NIH completed in flowsheets.   Interventions:  STAT Head CT-CODE STROKE  MRI w/o contrast.    Rosaria Ferries, RN

## 2023-01-30 NOTE — Progress Notes (Addendum)
[facility] Telemedicine Consult   Requesting Provider: Dr. Jerral Ralph Consult Participants: Patient, bedside RN, atrium RN [name] Location of the provider: [facility] Location of the patient: [facility] CT scan  This consult was provided via telemedicine with 2-way video and audio communication. The patient/family was informed that care would be provided in this way and agreed to receive care in this manner.   Chief Complaint: Altered mental status  HPI: Joanna [name] is a 52 year old African-American lady with baseline intellectual disability and history of acute diastolic heart failure, respiratory failure, acute kidney injury, coronary artery disease, diabetes, hypertension, hyperlipidemia, morbid obesity.  She was admitted on 01/28/2023 with hypertensive urgency as well as acute on chronic renal failure.  This morning at 9:30 AM she was found to have sudden onset of mental status change.  She was found to repeating the same phrase over and over again and not able to fully understand commands.  There is no facial droop, extremity weakness numbness noted.  By the time teleneurologist evaluated the patient she appeared to be improving with not back to baseline. Emergent CT scan of the head was obtained which showed no acute abnormality.  There was low density noted in the left caudate possibly a subacute infarct. CT angio not done because of patient's renal failure with creatinine of 3.5   LKW: 9:30 AM tpa given?: No, symptoms mild and resolving and subacute infarct on CT IR Thrombectomy? No, clinical exam not consistent with LVO. Modified Rankin Scale: 1-No significant post stroke disability and can perform usual duties with stroke symptoms Time of teleneurologist evaluation: 1105  Exam: Vitals:   01/30/23 0900 01/30/23 1031  BP: (!) 152/85 (!) 161/91  Pulse: 78   Resp: 16   Temp:    SpO2: 96%     General:  Obese middle-aged African-American lady not in  distress. Awake alert interactive.  Speech slightly nonfluent but able to speak sentences clearly.  Slight difficulty with repetition but able to name.  Comprehend simple midline and one-step commands but has baseline intellectual disability.  No difficulty with naming objects.  Extraocular movements full range without nystagmus.  Blinks to threat bilaterally.  Face is symmetric without weakness.  Tongue midline.  Motor system exam no upper or lower extremity drift.  Symmetric strength in all 4 extremities without focal weakness.  Touch  sensation intact bilaterally.  Gait not tested 1A: Level of Consciousness - 0 1B: Ask Month and Age - 1 1C: 'Blink Eyes' & 'Squeeze Hands' - 1 2: Test Horizontal Extraocular Movements - 0 3: Test Visual Fields - 0 4: Test Facial Palsy - 0 5A: Test Left Arm Motor Drift - 0 5B: Test Right Arm Motor Drift - 0 6A: Test Left Leg Motor Drift - 0 6B: Test Right Leg Motor Drift - 0 7: Test Limb Ataxia - 0 8: Test Sensation - 0 9: Test Language/Aphasia- 1 10: Test Dysarthria - 0 11: Test Extinction/Inattention - 0  NIHSS score: 3   Imaging Reviewed: Ct head ?  Small subacute left caudate infarct  Labs reviewed in epic and pertinent values follow: Cbg 106 mg%   Assessment: 52 year old African-American lady with sudden onset of altered mental status and confusion which appears to be improving.  Likely encephalopathy in the setting of renal failure and mild baseline intellectual disability.  Doubt stroke due to lack of focality on exam. IV TNK considered but patient is not a candidate due to minimal and resolving deficits. CT angio no  as symptoms do not suggest LVO and patient has renal failure Recommendations: Case discussed with Dr. Jerral Ralph patient's medical hospitalist who also examined patient and feels she is returned back to her baseline.  Hence I do not believe further emergent stroke related testing is necessary.  May do elective MRI scan and EEG later.    Given her subacute infarct on CT recommend stroke workup including echocardiogram, MRI/MRA brain, carotid ultrasound, lipid profile and hemoglobin A1c.  Aspirin for stroke prevention.  Aggressive risk factor modification.  Management of medical problems as per primary team.  Discussed with patient and bedside nurse and Dr. Lucrezia Starch  This patient is critically ill and at significant risk of neurological worsening, death and care requires constant monitoring of vital signs, hemodynamics,respiratory and cardiac monitoring, extensive review of multiple databases, frequent neurological assessment, discussion with family, other specialists and medical decision making of high complexity.I have made any additions or clarifications directly to the above note.This critical care time does not reflect procedure time, or teaching time or supervisory time of PA/NP/Med Resident etc but could involve care discussion time.  I spent 30 minutes of neurocritical care time  in the care of  this patient.      Delia Heady MD [facility] [phone]  If 7pm- 7am, please page neurology on call as listed in AMION.

## 2023-01-30 NOTE — Progress Notes (Signed)
PT Cancellation Note  Patient Details Name: Joanna [name] MRN: [id number] DOB: 1971-04-01   Cancelled Treatment:    Reason Eval/Treat Not Completed: Medical issues which prohibited therapy  Sedated for MRI, R/O stroke this AM. Blanchard Kelch PT Acute Rehabilitation Services Office [phone] Weekend pager-[phone]  Rada Hay 01/30/2023, 1:59 PM

## 2023-01-30 NOTE — TOC Progression Note (Deleted)
Transition of Care [facility]) - Progression Note    Patient Details  Name: Joanna [name] MRN: [id number] Date of Birth: [birth year]  [facility] of [facility]) CM/SW Contact  Harriett Sine, RN Phone Number:[phone]  01/30/2023, 12:30 PM  Clinical Narrative:    Spoke with pt at bedside about d/c plans. Pt will be returning to [facility]. Pt states her transportation home is with her friend. No TOC needs.    [facility], left a message.  Expected Discharge Plan and Services                                              Social Determinants of Health (SDOH) Interventions SDOH Screenings   Food Insecurity: No Food Insecurity (01/29/2023)  Housing: Low Risk  (01/29/2023)  Transportation Needs: No Transportation Needs (01/29/2023)  Utilities: Not At Risk (01/29/2023)  Tobacco Use: Low Risk  (01/28/2023)    Readmission Risk Interventions     No data to display

## 2023-01-30 NOTE — Progress Notes (Signed)
Carotid artery duplex has been completed. Preliminary results can be found in CV Proc through chart review.   01/30/23 2:31 PM Olen Cordial RVT

## 2023-01-30 NOTE — TOC Progression Note (Signed)
Transition of Care [facility]) - Progression Note    Patient Details  Name: [name] MRN: [id number] Date of Birth: 18-Feb-1971  [facility]) CM/SW Contact  Harriett Sine, RN Phone [phone]  01/30/2023, 11:15 AM  Clinical Narrative:    Sherron Monday with Lelon Mast of [employer] to order O2 for d/c, ok'd         Expected Discharge Plan and Services                                               Social Determinants of Health (SDOH) Interventions SDOH Screenings   Food Insecurity: No Food Insecurity (01/29/2023)  Housing: Low Risk  (01/29/2023)  Transportation Needs: No Transportation Needs (01/29/2023)  Utilities: Not At Risk (01/29/2023)  Tobacco Use: Low Risk  (01/28/2023)    Readmission Risk Interventions     No data to display

## 2023-01-30 NOTE — Progress Notes (Signed)
PROGRESS NOTE    Bertrice Stinchfield  WUJ:[id number] DOB: February 16, 1971 DOA: 01/28/2023 PCP: Hillery Aldo, NP    Brief Narrative:  52 year old with intellectual disability, type 2 diabetes, CKD and recent status unknown, hypertension, obesity presented from home with 5 days of back pain.  Poor historian.  Patient reports back pain started when she was reaching for her remote and twisted her back.  Patient reports visiting [place] with her sister and niece. In the emergency room CT scan abdomen pelvis was done without evidence of hydronephrosis or urinary retention, without evidence of aortic dissection.  Blood pressures were elevated.  Creatinine was 3.8, last known creatinine 1. 76 more than 3 years ago.  Admitted with AKI, back pain. 3/14, medically stabilizing.  Code stroke was called for patient's paraphrasing and confusion.  See details below.   Assessment & Plan:   Acute onset altered mental status, paraphrasing and confusion: Cause unknown.  Stroke alert was called.  Seen normal during morning rounds with normal conversation and mobility. Patient was repeating same sentence again again and unable to answer few basic questions prompted stroke alert. CT head nonacute. Patient examined at CT scan, she is back to her usual self with no focal neurological deficits.  Not a candidate for thrombolysis due to no significant neurological deficits. MRI brain pending.  Will recheck BMP and ammonia.  Hypertensive urgency: Blood pressures better after starting amlodipine, Coreg, hydralazine and Isordil.  Monitor.  Keep on as needed medications.  Will need new medication regimen on discharge.  AKI in a patient with CKD, recent labs unknown. No evidence of uremia. Last known creatinine 1.76, No evidence of hydronephrosis or urinary retention on her abdominal CT scan. Continue maintenance IV fluids.  Intake output monitoring.  Check postvoid residuals.  Recheck levels today.  She likely has progressive  kidney disease. Patient will need local nephrology follow-up in [place].  Will refer to nephrology on discharge.  Back pain: Probably musculoskeletal.  Symptomatic management.  Lidocaine patches.  Pain medications.  Improved.  Type 2 diabetes with hypoglycemia: On Amaryl at home.  Discontinued due to blood sugars less than 60.  Currently on sliding scale insulin.  Nocturnal hypoxemia: Probably due to obesity hypoventilation.  Overnight the study shows desaturations less than 89%.  She will be prescribed supplemental oxygen on discharge.     DVT prophylaxis: heparin injection 5,000 Units Start: 01/29/23 0600   Code Status: Full code Family Communication: Unable today.  Discussed with family members yesterday. Disposition Plan: Status is: Inpatient Remains inpatient appropriate because: Significant renal function dysfunction, IV fluids and mobility. Now with possible TIA.     Consultants:  Neurology, telestroke  Procedures:  None  Antimicrobials:  None   Subjective:  Examined patient at about 8:30 in the morning.  She was eager to go home and without any complaints.  Back pain was better. Examined patient after code stroke was called, she had already improved symptoms.  See above.  Objective: Vitals:   01/30/23 0700 01/30/23 0805 01/30/23 0900 01/30/23 1031  BP: (!) 163/92 (!) 182/102 (!) 152/85 (!) 161/91  Pulse: 84 86 78   Resp: 15  16   Temp: 98.9 F (37.2 C)     TempSrc: Oral     SpO2: 94%  96%   Weight:      Height:        Intake/Output Summary (Last 24 hours) at 01/30/2023 1149 Last data filed at 01/29/2023 2016 Gross per 24 hour  Intake 240 ml  Output --  Net 240 ml   Filed Weights   01/28/23 1239  Weight: 90.9 kg    Examination:  General exam: Appears calm and comfortable  Patient is alert awake and oriented to herself.  She is oriented to situation and place. Flat affect.  Interactive and appropriately answers questions. No cranial nerve  deficits. No focal neurological deficits. Respiratory system: Clear to auscultation. Respiratory effort normal. Cardiovascular system: S1 & S2 heard, RRR. No JVD, murmurs, rubs, gallops or clicks. No pedal edema. Gastrointestinal system: Abdomen is nondistended, soft and nontender. No organomegaly or masses felt. Normal bowel sounds heard.    Data Reviewed: I have personally reviewed following labs and imaging studies  CBC: Recent Labs  Lab 01/28/23 1436 01/29/23 0320  WBC 3.9* 4.7  HGB 10.7* 9.3*  HCT 34.4* 30.5*  MCV 86.2 89.2  PLT 286 258   Basic Metabolic Panel: Recent Labs  Lab 01/28/23 1436 01/29/23 0320  NA 139 139  K 4.2 5.0  CL 108 109  CO2 20* 20*  GLUCOSE 89 119*  BUN 43* 43*  CREATININE 3.93* 3.84*  CALCIUM 9.1 8.3*   GFR: Estimated Creatinine Clearance: 18.4 mL/min (A) (by C-G formula based on SCr of 3.84 mg/dL (H)). Liver Function Tests: No results for input(s): "AST", "ALT", "ALKPHOS", "BILITOT", "PROT", "ALBUMIN" in the last 168 hours. No results for input(s): "LIPASE", "AMYLASE" in the last 168 hours. No results for input(s): "AMMONIA" in the last 168 hours. Coagulation Profile: No results for input(s): "INR", "PROTIME" in the last 168 hours. Cardiac Enzymes: No results for input(s): "CKTOTAL", "CKMB", "CKMBINDEX", "TROPONINI" in the last 168 hours. BNP (last 3 results) No results for input(s): "PROBNP" in the last 8760 hours. HbA1C: Recent Labs    01/29/23 0320  HGBA1C 6.0*   CBG: Recent Labs  Lab 01/29/23 1128 01/29/23 1628 01/29/23 2157 01/30/23 0803 01/30/23 1052  GLUCAP 122* 97 100* 85 106*   Lipid Profile: No results for input(s): "CHOL", "HDL", "LDLCALC", "TRIG", "CHOLHDL", "LDLDIRECT" in the last 72 hours. Thyroid Function Tests: No results for input(s): "TSH", "T4TOTAL", "FREET4", "T3FREE", "THYROIDAB" in the last 72 hours. Anemia Panel: No results for input(s): "VITAMINB12", "FOLATE", "FERRITIN", "TIBC", "IRON",  "RETICCTPCT" in the last 72 hours. Sepsis Labs: No results for input(s): "PROCALCITON", "LATICACIDVEN" in the last 168 hours.  Recent Results (from the past 240 hour(s))  MRSA Next Gen by PCR, Nasal     Status: Abnormal   Collection Time: 01/28/23 10:50 PM   Specimen: Nasal Mucosa; Nasal Swab  Result Value Ref Range Status   MRSA by PCR Next Gen DETECTED (A) NOT DETECTED Final    Comment: (NOTE) The GeneXpert MRSA Assay (FDA approved for NASAL specimens only), is one component of a comprehensive MRSA colonization surveillance program. It is not intended to diagnose MRSA infection nor to guide or monitor treatment for MRSA infections. Test performance is not FDA approved in patients less than 57 years old. Performed at [facility], [address] [place], [place] [address]          Radiology Studies: CT HEAD CODE STROKE WO CONTRAST`  Result Date: 01/30/2023 CLINICAL DATA:  Expressive aphasia EXAM: CT HEAD WITHOUT CONTRAST TECHNIQUE: Contiguous axial images were obtained from the base of the skull through the vertex without intravenous contrast. RADIATION DOSE REDUCTION: This exam was performed according to the departmental dose-optimization program which includes automated exposure control, adjustment of the mA and/or kV according to patient size and/or use of iterative reconstruction technique. COMPARISON:  None  Available. FINDINGS: Brain: There is no acute intracranial hemorrhage, extra-axial fluid collection, or acute territorial infarct. Aspects is 10. There is a possible age-indeterminate infarct in the anterior limb of the left internal capsule (2-15). Parenchymal volume is normal. The ventricles are normal in size. Gray-white differentiation is preserved. There is extensive hypodensity in the supratentorial white matter, greater than expected for age. The pituitary and suprasellar region are normal. There is no mass lesion. There is no mass effect or midline  shift. Vascular: No hyperdense vessel or unexpected calcification. Skull: Normal. Negative for fracture or focal lesion. Sinuses/Orbits: The paranasal sinuses are clear. A right lens implant is in place. The globes and orbits are otherwise unremarkable. Other: The mastoid air cells and middle ear cavities are clear. IMPRESSION: 1. No acute intracranial hemorrhage or large vessel territorial infarct. Possible age-indeterminate infarct in the anterior limb of the left internal capsule. 2. Extensive hypodensity in the supratentorial white matter, greater than expected for age. Findings may reflect accelerated small-vessel ischemic change or demyelinating disease. Findings discussed with Dr Pearlean Brownie at 11:22 am. Electronically Signed   By: Lesia Hausen M.D.   On: 01/30/2023 11:30   US Aorta  Result Date: 01/28/2023 CLINICAL DATA:  Back pain. EXAM: ULTRASOUND OF ABDOMINAL AORTA TECHNIQUE: Ultrasound examination of the abdominal aorta and proximal common iliac arteries was performed to evaluate for aneurysm. Additional color and Doppler images of the distal aorta were obtained to document patency. COMPARISON:  CT 01/28/2023 FINDINGS: Abdominal aortic measurements as follows: Proximal:  2.3 x 2.4 cm Mid:  1.9 x 2.3 cm Distal: Obscured by bowel gas Iliac vessels are obscured by bowel gas. Note is made of a umbilical hernia with some fluid. IMPRESSION: Partially obscured by overlapping bowel gas. What is seen is nonaneurysmal. Please correlate with the prior CT Electronically Signed   By: Karen Kays M.D.   On: 01/28/2023 16:55   CT ABDOMEN PELVIS WO CONTRAST  Result Date: 01/28/2023 CLINICAL DATA:  Back pain, AKI EXAM: CT ABDOMEN AND PELVIS WITHOUT CONTRAST TECHNIQUE: Multidetector CT imaging of the abdomen and pelvis was performed following the standard protocol without IV contrast. RADIATION DOSE REDUCTION: This exam was performed according to the departmental dose-optimization program which includes automated  exposure control, adjustment of the mA and/or kV according to patient size and/or use of iterative reconstruction technique. COMPARISON:  Renal ultrasound 12/18/2019 FINDINGS: Lower chest: Small right pleural effusion. Mild interlobular septal thickening and mild ground-glass opacities likely due to edema. Cardiomegaly. Small pericardial effusion. Hepatobiliary: Unremarkable noncontrast appearance of the liver. Gallbladder and biliary tree are unremarkable. Pancreas: Unremarkable. Spleen: Unremarkable. Adrenals/Urinary Tract: Unremarkable adrenal glands. Nonspecific bilateral perinephric stranding. No urinary calculi or hydronephrosis. Unremarkable bladder. Stomach/Bowel: Normal caliber large and small bowel. Normal appendix. Stomach is within normal limits. Vascular/Lymphatic: Aortic atherosclerosis. No enlarged abdominal or pelvic lymph nodes. Reproductive: Globular enlargement of the uterus likely due to fibroids. No adnexal mass. Other: Fat containing periumbilical hernia. Diffuse body wall edema. No free intraperitoneal fluid or air. Musculoskeletal: No acute fracture. Bilateral L5 pars defects with grade 1 anterolisthesis of L5 on S1. Advanced disc space height loss at L5-S1. IMPRESSION: 1. Small right pleural effusion. Mild interlobular septal thickening and mild ground-glass opacities likely due to edema. 2. Cardiomegaly with small pericardial effusion. 3. No definite acute abnormality in the abdomen or pelvis. Bilateral perinephric stranding is nonspecific and may be normal or due to edema. Pyelonephritis is difficult to exclude without IV contrast. 4. Chronic bilateral L5 spondylolysis with grade 1  spondylolisthesis. 5. Fibroid uterus. Aortic Atherosclerosis (ICD10-I70.0). Electronically Signed   By: Minerva Fester M.D.   On: 01/28/2023 16:23        Scheduled Meds:  amLODipine  10 mg Oral Daily   carvedilol  6.25 mg Oral BID WC   Chlorhexidine Gluconate Cloth  6 each Topical QHS   heparin  5,000  Units Subcutaneous Q8H   hydrALAZINE  50 mg Oral Q8H   insulin aspart  0-6 Units Subcutaneous TID WC   isosorbide dinitrate  10 mg Oral TID   lidocaine  1 patch Transdermal Q24H   mupirocin ointment   Nasal BID   Continuous Infusions:  sodium chloride 100 mL/hr at 01/30/23 0541     LOS: 2 days    Time spent: 35 minutes    Dorcas Carrow, MD [facility]

## 2023-01-30 NOTE — Progress Notes (Signed)
1053 Code Stroke cart activated- pt en route to CT. LKW 0930, ROS @ 1030, expressive aphasia, pt repeating words over and over. BP 161/91, HR 107, glucose 106 1055 Dr Amada Jupiter paged [phone] Dr Amada Jupiter in another code stroke, asked to page back up 1059 Dr Pearlean Brownie paged [phone] Dr Pearlean Brownie on camera- pt examined in CT. 1108 Dr Pearlean Brownie states NCCT shows basal ganglia infarct- questionable if a few hours-days old. Pt symptoms resolving. No TNK

## 2023-01-30 NOTE — Progress Notes (Signed)
STROKE TEAM ADDENDUM :  Reviewed MRI scan of the brain which shows tiny punctate right internal capsule infarct with extensive changes of chronic small vessel disease.  Patient has no focal neurological symptoms on the left side this is likely a silent incidental infarct. Carotid ultrasound shows no significant extracranial stenosis. MRA of the brain and echocardiogram are pending.  Hemoglobin A1c 6.0 LDL cholesterol 16/28/24 was elevated at 153.  Recommend : check MRI of the brain.  Aspirin and Plavix for 3 weeks followed by aspirin alone and aggressive risk factor modification.  Statin for elevated lipids.  Follow-up in outpatient stroke clinic in 2 months. Discussed with Dr. Janeth Rase call for questions.  Delia Heady, MD

## 2023-01-30 NOTE — Plan of Care (Signed)
  Problem: Education: Goal: Knowledge of disease or condition will improve Outcome: Progressing Goal: Knowledge of secondary prevention will improve (MUST DOCUMENT ALL) Outcome: Progressing Goal: Knowledge of patient specific risk factors will improve (Mark N/A or DELETE if not current risk factor) Outcome: Progressing   

## 2023-01-31 ENCOUNTER — Inpatient Hospital Stay (HOSPITAL_COMMUNITY): Payer: Medicare HMO

## 2023-01-31 DIAGNOSIS — I160 Hypertensive urgency: Secondary | ICD-10-CM | POA: Diagnosis not present

## 2023-01-31 DIAGNOSIS — I6389 Other cerebral infarction: Secondary | ICD-10-CM | POA: Diagnosis not present

## 2023-01-31 LAB — ECHOCARDIOGRAM COMPLETE BUBBLE STUDY
Area-P 1/2: 5.27 cm2
Calc EF: 52.5 %
MV VTI: 2.35 cm2
S' Lateral: 3.35 cm
Single Plane A2C EF: 52.4 %
Single Plane A4C EF: 51.9 %

## 2023-01-31 LAB — GLUCOSE, CAPILLARY
Glucose-Capillary: 95 mg/dL (ref 70–99)
Glucose-Capillary: 142 mg/dL — ABNORMAL HIGH (ref 70–99)
Glucose-Capillary: 108 mg/dL — ABNORMAL HIGH (ref 70–99)
Glucose-Capillary: 133 mg/dL — ABNORMAL HIGH (ref 70–99)

## 2023-01-31 MED ORDER — HYDRALAZINE HCL 50 MG PO TABS
100.0000 mg | ORAL_TABLET | Freq: Three times a day (TID) | ORAL | Status: DC
  Administered 2023-01-31 – 2023-02-04 (×13): 100 mg via ORAL
  Filled 2023-01-31 (×13): qty 2

## 2023-01-31 MED ORDER — ATORVASTATIN CALCIUM 40 MG PO TABS
40.0000 mg | ORAL_TABLET | Freq: Every day | ORAL | Status: DC
  Administered 2023-01-31 – 2023-02-04 (×5): 40 mg via ORAL
  Filled 2023-01-31 (×5): qty 1

## 2023-01-31 NOTE — Progress Notes (Addendum)
   01/31/23 0950  NIH Stroke Scale   Dizziness Present No  Headache Present Yes  Interval Shift assessment  Level of Consciousness (1a.)    0  LOC Questions (1b. )    0  LOC Commands (1c. )    0  Best Gaze (2. )   0  Visual (3. )   0  Facial Palsy (4. )     0  Motor Arm, Left (5a. )    0  Motor Arm, Right (5b. )  0  Motor Leg, Left (6a. )   1  Motor Leg, Right (6b. )  1  Limb Ataxia (7. ) 0  Sensory (8. )   1  Best Language (9. )   1  Dysarthria (10. ) 0  Extinction/Inattention (11.)    0  Complete NIHSS TOTAL 4   Neuro examination limited by patient's intellectual disability. Pt is unable to read which limited the language exam, but was able to identify letters. Pt also failed to identify 3 out of 6 objects. For example, pt did identify trees and a rug but did not recognize it as a hammock. She also does not understand the words "sharp" or "dull" or if the touch is the same strength, but can tell where she is being poked and has sensation in all 4 limbs.

## 2023-01-31 NOTE — Progress Notes (Signed)
SATURATION QUALIFICATIONS: (This note is used to comply with regulatory documentation for home oxygen)  Patient Saturations on Room Air at Rest = 96%  Patient Saturations on Room Air while Ambulating = 87%  Patient Saturations on 2 Liters of oxygen while Ambulating = 96%  Please briefly explain why patient needs home oxygen:to maintain saturation  > 88% while ambulating.  Blanchard Kelch PT Acute Rehabilitation Services Office [phone] Weekend [phone]

## 2023-01-31 NOTE — Progress Notes (Signed)
PROGRESS NOTE    Joanna [name]  AOZ:[id number] DOB: 01/31/1971 DOA: 01/28/2023 PCP: Hillery Aldo, NP    Brief Narrative:  52 year old with intellectual disability, type 2 diabetes, CKD and recent status unknown, hypertension, obesity presented from home with 5 days of back pain.  Poor historian.  Patient reported  back pain started when she was reaching for her remote and twisted her back.  Patient just moved to [place] with her sister and niece. In the emergency room CT scan abdomen pelvis was done without evidence of hydronephrosis or urinary retention, without evidence of aortic dissection.  Blood pressures were elevated.  Creatinine was 3.8, last known creatinine 1. 76 more than 3 years ago.  Admitted with AKI, back pain. 3/14, medically stabilizing.  Code stroke was called for patient's paraphrasing and confusion.  See details below. She did have small lacunar stroke.    Assessment & Plan:   Acute right MCA ischemic stroke:  Symptoms, acute onset altered mental status, paraphrasing and confusion:  CT head findings, no acute findings MRI of the brain, chronic microvascular changes along with very small acute infarct right internal capsule Carotid Doppler, less than 39% stenosis bilateral carotids. 2D echocardiogram, pending. Antiplatelet therapy, none at home.  Neurology recommended aspirin Plavix for 3 weeks then aspirin alone LDL pending.  Will start patient on Lipitor 40 mg daily. Hemoglobin A1c, 6.  Well-controlled.  On insulin. Therapy recommendations, reevaluation pending. To be reevaluated by PT OT today to prepare for discharge.  Hypertensive urgency: Blood pressures still elevated.   Already on amlodipine, Coreg and Isordil.  Will increase hydralazine dose today.  Will need new medication regimen on discharge.  AKI in a patient with CKD, recent labs unknown. No evidence of uremia. Last known creatinine 1.76, No evidence of hydronephrosis or urinary retention on her  abdominal CT scan. She likely has progressive kidney disease. Discontinue IV fluids today and recheck levels tomorrow morning. Patient will need local nephrology follow-up in [place].  Will refer to nephrology on discharge.  Back pain: Probably musculoskeletal.  Symptomatic management.  Lidocaine patches.  Pain medications.  Improved.  Type 2 diabetes with hypoglycemia: On Amaryl at home.  Discontinued due to blood sugars less than 60.  Currently on sliding scale insulin.  A1c 6.  Will hold off on treatment.  Nocturnal hypoxemia: Probably due to obesity hypoventilation.  Overnight the study shows desaturations less than 89%.  She will be prescribed supplemental oxygen on discharge.     DVT prophylaxis: heparin injection 5,000 Units Start: 01/29/23 0600   Code Status: Full code Family Communication: Sister called and updated on the phone. Disposition Plan: Status is: Inpatient Remains inpatient appropriate because: Significant renal function dysfunction, IV fluids and mobility. Stroke workup.     Consultants:  Neurology, telestroke  Procedures:  None  Antimicrobials:  None   Subjective:  Patient seen and examined.  Denies any complaints.  Occasionally paraphrasing, however difficult to know whether this is her baseline.  Patient asked me whether she is going home to [place] or to [place].  Blood pressures elevated.  Objective: Vitals:   01/31/23 0730 01/31/23 0908 01/31/23 1010 01/31/23 1024  BP:  (!) 195/97 (!) 178/100 (!) 178/100  Pulse:  86    Resp:      Temp: 98.6 F (37 C)     TempSrc: Oral     SpO2:      Weight:      Height:        Intake/Output Summary (Last 24  hours) at 01/31/2023 1037 Last data filed at 01/31/2023 0500 Gross per 24 hour  Intake 806.12 ml  Output 240 ml  Net 566.12 ml   Filed Weights   01/28/23 1239  Weight: 90.9 kg    Examination:  General exam: Appears calm and comfortable  Alert awake and oriented to herself and  situation.  Baseline cognitive dysfunction. Flat affect.  Interactive and appropriately answers questions. No cranial nerve deficits. No focal neurological deficits. Respiratory system: Clear to auscultation. Respiratory effort normal. Cardiovascular system: S1 & S2 heard, RRR. No JVD, murmurs, rubs, gallops or clicks. No pedal edema. Gastrointestinal system: Abdomen is nondistended, soft and nontender. No organomegaly or masses felt. Normal bowel sounds heard.    Data Reviewed: I have personally reviewed following labs and imaging studies  CBC: Recent Labs  Lab 01/28/23 1436 01/29/23 0320  WBC 3.9* 4.7  HGB 10.7* 9.3*  HCT 34.4* 30.5*  MCV 86.2 89.2  PLT 286 258   Basic Metabolic Panel: Recent Labs  Lab 01/28/23 1436 01/29/23 0320 01/30/23 1303  NA 139 139 138  K 4.2 5.0 4.2  CL 108 109 113*  CO2 20* 20* 17*  GLUCOSE 89 119* 110*  BUN 43* 43* 36*  CREATININE 3.93* 3.84* 3.59*  CALCIUM 9.1 8.3* 7.1*   GFR: Estimated Creatinine Clearance: 19.7 mL/min (A) (by C-G formula based on SCr of 3.59 mg/dL (H)). Liver Function Tests: No results for input(s): "AST", "ALT", "ALKPHOS", "BILITOT", "PROT", "ALBUMIN" in the last 168 hours. No results for input(s): "LIPASE", "AMYLASE" in the last 168 hours. Recent Labs  Lab 01/30/23 1303  AMMONIA <10   Coagulation Profile: No results for input(s): "INR", "PROTIME" in the last 168 hours. Cardiac Enzymes: No results for input(s): "CKTOTAL", "CKMB", "CKMBINDEX", "TROPONINI" in the last 168 hours. BNP (last 3 results) No results for input(s): "PROBNP" in the last 8760 hours. HbA1C: Recent Labs    01/29/23 0320  HGBA1C 6.0*   CBG: Recent Labs  Lab 01/30/23 1052 01/30/23 1322 01/30/23 1700 01/30/23 2142 01/31/23 0741  GLUCAP 106* 117* 107* 134* 95   Lipid Profile: No results for input(s): "CHOL", "HDL", "LDLCALC", "TRIG", "CHOLHDL", "LDLDIRECT" in the last 72 hours. Thyroid Function Tests: No results for input(s):  "TSH", "T4TOTAL", "FREET4", "T3FREE", "THYROIDAB" in the last 72 hours. Anemia Panel: No results for input(s): "VITAMINB12", "FOLATE", "FERRITIN", "TIBC", "IRON", "RETICCTPCT" in the last 72 hours. Sepsis Labs: No results for input(s): "PROCALCITON", "LATICACIDVEN" in the last 168 hours.  Recent Results (from the past 240 hour(s))  MRSA Next Gen by PCR, Nasal     Status: Abnormal   Collection Time: 01/28/23 10:50 PM   Specimen: Nasal Mucosa; Nasal Swab  Result Value Ref Range Status   MRSA by PCR Next Gen DETECTED (A) NOT DETECTED Final    Comment: (NOTE) The GeneXpert MRSA Assay (FDA approved for NASAL specimens only), is one component of a comprehensive MRSA colonization surveillance program. It is not intended to diagnose MRSA infection nor to guide or monitor treatment for MRSA infections. Test performance is not FDA approved in patients less than 8 years old. Performed at [facility], [address] [place], [place] [address]          Radiology Studies: MR ANGIO HEAD WO CONTRAST  Result Date: 01/30/2023 CLINICAL DATA:  Stroke follow-up.  Confusion and weakness. EXAM: MRA HEAD WITHOUT CONTRAST TECHNIQUE: Angiographic images of the Circle of Willis were acquired using MRA technique without intravenous contrast. COMPARISON:  Brain MRI from earlier  today FINDINGS: Anterior circulation: Moderate narrowing at a right M2 origin. No beading or aneurysm. No vascular malformation. Posterior circulation: Mild left vertebral artery dominance. The vertebral artery on the right is mildly irregular which is likely atheromatous. No flow reducing stenosis in the vertebral or basilar arteries. No branch occlusion, beading, or aneurysm. Mild branch vessel atheromatous irregularity. IMPRESSION: 1. No emergent arterial finding. 2. Intracranial atherosclerosis most notably causing a moderate right M2 origin stenosis. Electronically Signed   By: Tiburcio Pea M.D.   On: 01/30/2023  16:49   VAS US CAROTID (at [facility] and [facility] only)  Result Date: 01/30/2023 Carotid Arterial Duplex Study Patient Name:  PRIANKA SUTHARD  Date of Exam:   01/30/2023 Medical Rec #: [id number]  Accession #:    [id number] Date of Birth: [birth year]  Patient Gender: F Patient Age:   61 years Exam Location:  [facility] Procedure:      VAS US CAROTID Referring Phys: Dorcas Carrow --------------------------------------------------------------------------------  Indications:       Carotid stenosis-bilateral I65.23. Risk Factors:      Hypertension, Diabetes. Limitations        Today's exam was limited due to the body habitus of the                    patient, the patient's respiratory variation and patient                    somnolence, patient movement, patient positioning. Comparison Study:  No prior studies. Performing Technologist: Chanda Busing RVT  Examination Guidelines: A complete evaluation includes B-mode imaging, spectral Doppler, color Doppler, and power Doppler as needed of all accessible portions of each vessel. Bilateral testing is considered an integral part of a complete examination. Limited examinations for reoccurring indications may be performed as noted.  Right Carotid Findings: +----------+--------+--------+--------+-----------------------+--------+           PSV cm/sEDV cm/sStenosisPlaque Description     Comments +----------+--------+--------+--------+-----------------------+--------+ CCA Prox  116     21              smooth and heterogenous         +----------+--------+--------+--------+-----------------------+--------+ CCA Distal69      19              smooth and heterogenous         +----------+--------+--------+--------+-----------------------+--------+ ICA Prox  78      39              smooth and heterogenous         +----------+--------+--------+--------+-----------------------+--------+ ICA Mid   91      41              smooth and heterogenous          +----------+--------+--------+--------+-----------------------+--------+ ICA Distal62      36                                     tortuous +----------+--------+--------+--------+-----------------------+--------+ ECA       80      14                                              +----------+--------+--------+--------+-----------------------+--------+ +----------+--------+-------+--------+-------------------+           PSV cm/sEDV cmsDescribeArm Pressure (mmHG) +----------+--------+-------+--------+-------------------+  [phone]                                        +----------+--------+-------+--------+-------------------+ +---------+--------+--+--------+--+---------+ VertebralPSV cm/s39EDV cm/s10Antegrade +---------+--------+--+--------+--+---------+  Left Carotid Findings: +----------+--------+--------+--------+-----------------------+--------+           PSV cm/sEDV cm/sStenosisPlaque Description     Comments +----------+--------+--------+--------+-----------------------+--------+ CCA Prox  145     28              smooth and heterogenous         +----------+--------+--------+--------+-----------------------+--------+ CCA Mid   149     38                                              +----------+--------+--------+--------+-----------------------+--------+ CCA Distal85      17              smooth and heterogenous         +----------+--------+--------+--------+-----------------------+--------+ ICA Prox  86      27              smooth and heterogenous         +----------+--------+--------+--------+-----------------------+--------+ ICA Mid                                                  tortuous +----------+--------+--------+--------+-----------------------+--------+ ICA Distal91      49                                     tortuous +----------+--------+--------+--------+-----------------------+--------+ ECA       126     14                                               +----------+--------+--------+--------+-----------------------+--------+ +----------+--------+--------+--------+-------------------+           PSV cm/sEDV cm/sDescribeArm Pressure (mmHG) +----------+--------+--------+--------+-------------------+ [id number]                                         +----------+--------+--------+--------+-------------------+ +---------+--------+--+--------+--+---------+ VertebralPSV cm/s89EDV cm/s36Antegrade +---------+--------+--+--------+--+---------+   Summary: Right Carotid: Velocities in the right ICA are consistent with a 1-39% stenosis. Left Carotid: Velocities in the left ICA are consistent with a 1-39% stenosis. Vertebrals: Bilateral vertebral arteries demonstrate antegrade flow. *See table(s) above for measurements and observations.  Electronically signed by Delia Heady MD on 01/30/2023 at 3:12:12 PM.    Final    MR BRAIN WO CONTRAST  Result Date: 01/30/2023 CLINICAL DATA:  Sudden onset altered mental status repeat in the same for age. EXAM: MRI HEAD WITHOUT CONTRAST TECHNIQUE: Multiplanar, multiecho pulse sequences of the brain and surrounding structures were obtained without intravenous contrast. COMPARISON:  Same-day CT head FINDINGS: Brain: There is a punctate acute infarct in the anterior limb of the right internal capsule without associated FLAIR signal abnormality. There is no hemorrhage or mass effect. There is no other evidence of acute infarct. There  is no acute intracranial hemorrhage. Background parenchymal volume is normal. The ventricles are normal in size. There is extensive FLAIR signal abnormality throughout the supratentorial white matter which is favored to reflect significantly age accelerated chronic small-vessel ischemic change. There are numerous punctate chronic microhemorrhages which are primarily centrally distributed, likely hypertensive in etiology. There are small remote infarcts in  the left thalamus, pons, and bilateral corona radiata. The pituitary and suprasellar region are normal. There is no mass lesion. There is no mass effect or midline shift. Vascular: Normal flow voids. Skull and upper cervical spine: Normal marrow signal. Sinuses/Orbits: There is mild mucosal thickening in the paranasal sinuses. A right lens implant is noted. The globes and orbits are otherwise unremarkable. Other: The mastoid air cells and middle ear cavities are clear. IMPRESSION: 1. Punctate acute infarct in the anterior limb of the right internal capsule. 2. Significantly age accelerated underlying chronic small-vessel ischemic change, and numerous punctate chronic microhemorrhages which are likely hypertensive in etiology. 3. Small remote infarcts in the left thalamus, pons, and bilateral corona radiata. Electronically Signed   By: Lesia Hausen M.D.   On: 01/30/2023 12:27   CT HEAD CODE STROKE WO CONTRAST`  Result Date: 01/30/2023 CLINICAL DATA:  Expressive aphasia EXAM: CT HEAD WITHOUT CONTRAST TECHNIQUE: Contiguous axial images were obtained from the base of the skull through the vertex without intravenous contrast. RADIATION DOSE REDUCTION: This exam was performed according to the departmental dose-optimization program which includes automated exposure control, adjustment of the mA and/or kV according to patient size and/or use of iterative reconstruction technique. COMPARISON:  None Available. FINDINGS: Brain: There is no acute intracranial hemorrhage, extra-axial fluid collection, or acute territorial infarct. Aspects is 10. There is a possible age-indeterminate infarct in the anterior limb of the left internal capsule (2-15). Parenchymal volume is normal. The ventricles are normal in size. Gray-white differentiation is preserved. There is extensive hypodensity in the supratentorial white matter, greater than expected for age. The pituitary and suprasellar region are normal. There is no mass lesion.  There is no mass effect or midline shift. Vascular: No hyperdense vessel or unexpected calcification. Skull: Normal. Negative for fracture or focal lesion. Sinuses/Orbits: The paranasal sinuses are clear. A right lens implant is in place. The globes and orbits are otherwise unremarkable. Other: The mastoid air cells and middle ear cavities are clear. IMPRESSION: 1. No acute intracranial hemorrhage or large vessel territorial infarct. Possible age-indeterminate infarct in the anterior limb of the left internal capsule. 2. Extensive hypodensity in the supratentorial white matter, greater than expected for age. Findings may reflect accelerated small-vessel ischemic change or demyelinating disease. Findings discussed with Dr Pearlean Brownie at 11:22 am. Electronically Signed   By: Lesia Hausen M.D.   On: 01/30/2023 11:30        Scheduled Meds:  amLODipine  10 mg Oral Daily   aspirin  300 mg Rectal Daily   Or   aspirin  325 mg Oral Daily   atorvastatin  40 mg Oral Daily   carvedilol  6.25 mg Oral BID WC   Chlorhexidine Gluconate Cloth  6 each Topical QHS   clopidogrel  75 mg Oral Daily   heparin  5,000 Units Subcutaneous Q8H   hydrALAZINE  100 mg Oral Q8H   insulin aspart  0-6 Units Subcutaneous TID WC   isosorbide dinitrate  10 mg Oral TID   lidocaine  1 patch Transdermal Q24H   mupirocin ointment   Nasal BID   Continuous Infusions:  LOS: 3 days    Time spent: 35 minutes    Dorcas Carrow, MD [facility]

## 2023-01-31 NOTE — Evaluation (Signed)
Physical Therapy Evaluation Patient Details Name: Joanna [name] MRN: [id number] DOB: 09-15-1970 Today's Date: 01/31/2023  History of Present Illness  Patient is a 52 year old female who presented from home with 5 day history of back pain. Patient was admitted with hypertensive urgency, AKI and back pain. PMH: type II DM, intellectual disability, obesity. Code stroke on 8/14, WUJ:[id number] punctate right internal capsule infarct with extensive changes of chronic small vessel disease.  Patient has no focal neurological symptoms on the left side this is likely a silent incidental infarct per neurology  Clinical Impression  The patient is very pleasant, able to express self and answer questions. Patient on Ra 95% SPO2, Ambulated  200' with Spo2 lowest 87% and noted 3/4 dyspnea at end. Patient rested and  then ambulated x 20' on 2 L  with Spo2 remaining  >95% noted less dyspneic at end.    RN aware.  Patient should progress to return home  with caregivers.       If plan is discharge home, recommend the following: A little help with bathing/dressing/bathroom;Assistance with cooking/housework;Help with stairs or ramp for entrance;Assist for transportation   Can travel by private vehicle        Equipment Recommendations None recommended by PT  Recommendations for Other Services       Functional Status Assessment Patient has had a recent decline in their functional status and demonstrates the ability to make significant improvements in function in a reasonable and predictable amount of time.     Precautions / Restrictions Precautions Precautions: Fall      Mobility  Bed Mobility Overal bed mobility: Modified Independent                  Transfers Overall transfer level: Needs assistance Equipment used: None Transfers: Sit to/from Stand Sit to Stand: Supervision                Ambulation/Gait Ambulation/Gait assistance: Supervision Gait Distance (Feet): 200 Feet (x  2) Assistive device: None Gait Pattern/deviations: Step-through pattern, Drifts right/left Gait velocity: WFL     General Gait Details: ambualted on Ra with #/4 dysopnea, then amb on 2 L with less dyspnea  Stairs            Wheelchair Mobility     Tilt Bed    Modified Rankin (Stroke Patients Only)       Balance Overall balance assessment: Mild deficits observed, not formally tested                                           Pertinent Vitals/Pain Pain Assessment Pain Assessment: Faces Faces Pain Scale: Hurts little more Pain Location: Head ache Pain Descriptors / Indicators: Discomfort, Grimacing Pain Intervention(s): Monitored during session    Home Living Family/patient expects to be discharged to:: Private residence Living Arrangements: Other relatives Available Help at Discharge: Family;Available PRN/intermittently Type of Home: Apartment Home Access: Stairs to enter Entrance Stairs-Rails: Right;Left Entrance Stairs-Number of Steps: flight   Home Layout: One level Home Equipment: None Additional Comments: patient reported that she did live  in [place] with her sister and sisters children. Now lives with foster sister. patient reported that her sisters children take care of her.    Prior Function                       Extremity/Trunk Assessment  Lower Extremity Assessment Lower Extremity Assessment: Overall WFL for tasks assessed    Cervical / Trunk Assessment Cervical / Trunk Assessment: Normal  Communication   Communication Communication: No apparent difficulties  Cognition Arousal: Alert Behavior During Therapy: WFL for tasks assessed/performed Overall Cognitive Status: No family/caregiver present to determine baseline cognitive functioning                                 General Comments: patient was plesant and cooperative during session. able to answer all questions, speach was  understandable        General Comments      Exercises     Assessment/Plan    PT Assessment Patient needs continued PT services  PT Problem List Decreased activity tolerance;Cardiopulmonary status limiting activity;Decreased knowledge of precautions;Pain       PT Treatment Interventions Therapeutic activities;Gait training;Functional mobility training;Therapeutic exercise;Patient/family education    PT Goals (Current goals can be found in the Care Plan section)  Acute Rehab PT Goals Patient Stated Goal: go home PT Goal Formulation: With patient Time For Goal Achievement: 02/14/23 Potential to Achieve Goals: Good    Frequency Min 1X/week     Co-evaluation               AM-PAC PT "6 Clicks" Mobility  Outcome Measure Help needed turning from your back to your side while in a flat bed without using bedrails?: None Help needed moving from lying on your back to sitting on the side of a flat bed without using bedrails?: None Help needed moving to and from a bed to a chair (including a wheelchair)?: A Little Help needed standing up from a chair using your arms (e.g., wheelchair or bedside chair)?: A Little Help needed to walk in hospital room?: A Little Help needed climbing 3-5 steps with a railing? : A Lot 6 Click Score: 19    End of Session Equipment Utilized During Treatment: Gait belt Activity Tolerance: Patient tolerated treatment well Patient left: in chair;with call bell/phone within reach;with chair alarm set Nurse Communication: Mobility status PT Visit Diagnosis: Unsteadiness on feet (R26.81);Difficulty in walking, not elsewhere classified (R26.2)    Time: 1610-9604 PT Time Calculation (min) (ACUTE ONLY): 26 min   Charges:   PT Evaluation $PT Eval Low Complexity: 1 Low   PT General Charges $$ ACUTE PT VISIT: 1 Visit         Blanchard Kelch PT Acute Rehabilitation Services Office [phone] Weekend pager-[phone]   Rada Hay 01/31/2023, 1:45 PM

## 2023-01-31 NOTE — Progress Notes (Signed)
  Echocardiogram 2D Echocardiogram has been performed.  Joanna [name] 01/31/2023, 8:50 AM

## 2023-01-31 NOTE — Evaluation (Addendum)
Speech Language Pathology Evaluation Patient Details Name: Joanna [name] MRN: [id number] DOB: 10-06-1970 Today's Date: 01/31/2023 Time: 0865-7846 SLP Time Calculation (min) (ACUTE ONLY): 16 min  Problem List:  Patient Active Problem List   Diagnosis Date Noted   Hypertensive urgency, malignant 01/28/2023   Back pain 01/28/2023   Pain due to onychomycosis of toenails of both feet 11/16/2020   HTN (hypertension) 12/16/2019   Type 2 diabetes mellitus without complication (HCC) 12/16/2019   Anasarca 12/16/2019   AKI (acute kidney injury) (HCC) 12/16/2019   New onset of congestive heart failure (HCC) 12/16/2019   Obesity, Class III, BMI 40-49.9 (morbid obesity) (HCC) 12/16/2019   Depression 12/16/2019   Intellectual disability 12/16/2019   Acute diastolic heart failure (HCC) 12/16/2019   Acute respiratory failure with hypoxia (HCC) 12/16/2019   Hypertensive urgency 12/16/2019   Past Medical History:  Past Medical History:  Diagnosis Date   Acute diastolic heart failure (HCC) 12/16/2019   Acute respiratory failure with hypoxia (HCC) 12/16/2019   AKI (acute kidney injury) (HCC) 12/16/2019   Anasarca 12/16/2019   CAD (coronary artery disease)    Diabetes mellitus without complication (HCC)    HTN (hypertension) 12/16/2019   Hypertension    Hypertensive urgency 12/16/2019   Intellectual disability 12/16/2019   Morbid obesity (HCC)    New onset of congestive heart failure (HCC) 12/16/2019   Obesity, Class III, BMI 40-49.9 (morbid obesity) (HCC) 12/16/2019   Type 2 diabetes mellitus without complication (HCC) 12/16/2019   Past Surgical History: History reviewed. No pertinent surgical history. HPI:  52yo female admitted 01/28/23 with back pain and HTN. PMH; intellectual disability at baseline, DM2, AKI, HTN, dCHF, obesity, Acute respiratory failure with hypoxia (2021), CAD. MRI  = punctate acute infarct anterior limb of right internal capsule, chronic small vessel ischemic change, numerous  punctate chronic microhemorrhages, small remote infarcts left thalamus, pons, and bilateral corona radiata   Assessment / Plan / Recommendation Clinical Impression  Pt seen at bedside for cognitive linguistic evaluation. Pt was seated upright in recliner finishing lunch. She was awake, but nodded off several times during this session. Pt was pleasant and cooperative with unfamiliar therapist.   Pt presents with baseline cognitive linguistic deficits. No family present at this time to discuss level of baseline severity, or changes in baseline with this admit. Pt speech is fully intelligible. Cognitive linguistic deficits are apparent.   The Mini-Mental State Exam (MMSE) was administered today. Pt scored 15/30, with difficulty noted on attention, delayed recall, phrase repetition, following 3-step directions, reading/writing and figure copying. Pt also exhibited significant difficulty with clock drawing task, including writing numbers in reverse order in the circle, as well as writing numbers backwards, raising suspicion for executive function deficits. Pt named 2 animals in 60 seconds, indicating deficits in thought organization. Pt exhibited difficulty answering some questions about herself - why she is in the hospital, how far she went in school, but was able to tell me she lives with her sister and nephew and that she has no children - just nieces and nephews.   It is uncertain at this time if pt has new deficits related to MRI findings, as pt reports she is at her baseline and there is no family present. Pt reports she does not work, and her sister manages finances and pt's meds. Information from family would be beneficial. If there are new deficits, a change in function, or difficulties noted upon return to normal routines, pt may benefit from continued ST intervention via home  health or outpatient services.    SLP Assessment  SLP Recommendation/Assessment: All further Speech Language Pathology  needs can be addressed in the next venue of care if needs arise.  SLP Visit Diagnosis: Cognitive communication deficit (R41.841)    Recommendations for follow up therapy are one component of a multi-disciplinary discharge planning process, led by the attending physician.  Recommendations may be updated based on patient status, additional functional criteria and insurance authorization.    Follow Up Recommendations  Follow physician's recommendations for discharge plan and follow up therapies    Assistance Recommended at Discharge  Frequent or constant Supervision/Assistance  Functional Status Assessment  (no family present to discuss baseline level of cognitive deficits)     SLP Evaluation Cognition  Overall Cognitive Status: No family/caregiver present to determine baseline cognitive functioning Arousal/Alertness: Lethargic (pt required intermittent verbal/tactile stimulation to maintain alertness.) Orientation Level: Oriented to person;Oriented to place;Oriented to time;Oriented to situation       Comprehension  Auditory Comprehension Overall Auditory Comprehension: Impaired at baseline Reading Comprehension Reading Status: Impaired Sentence Level: Impaired    Expression Verbal Expression Overall Verbal Expression: Impaired at baseline   Oral / Motor  Motor Speech Overall Motor Speech: Appears within functional limits for tasks assessed Respiration: Within functional limits Phonation: Normal Resonance: Within functional limits Articulation: Within functional limitis Intelligibility: Intelligible Motor Planning: Witnin functional limits            B. Murvin Natal, [facility], CCC-SLP Speech Language Pathologist Office: [phone]  Leigh Aurora 01/31/2023, 1:38 PM

## 2023-01-31 NOTE — Evaluation (Signed)
Occupational Therapy Re-evaluation Patient Details Name: Joanna [name] MRN: [id number] DOB: January 31, 1971 Today's Date: 01/31/2023   History of Present Illness Patient is a 52 year old female who presented from home with 5 day history of back pain. Patient was admitted with hypertensive urgency, AKI and back pain. PMH: type II DM, intellectual disability, obesity. Code stroke called on 01/30/23. MRI of the brain revealed the following:  1. Punctate acute infarct in the anterior limb of the right internal capsule. 2. Significantly age accelerated underlying chronic small-vessel ischemic change, and numerous punctate chronic micro hemorrhages which are likely hypertensive in etiology. 3. Small remote infarcts in the left thalamus, pons, and bilateral corona radiata.   Clinical Impression   The pt performed all assessed tasks with supervision including, lower body dressing, sit to stand, ambulating in the hall without an assistive device, and toileting at bathroom level. She required occasional light verbal cues for general safety during progressive activity, as she intermittently attempted tasks in a hurried manner. She was also noted to be with increased dyspnea with activity.  Her O2 saturation was assessed and noted to be as follows: room air at rest = 96%, room air while ambulating = 87%, on 2 liters of oxygen while ambulating = 96%. She will likely only require 1-2 follow up OT visits during her hospital stay, in order to maximize her independence and safety with ADLs & to facilitate her safe return home. No post-acute therapy needs identified.         If plan is discharge home, recommend the following: Help with stairs or ramp for entrance;Direct supervision/assist for medications management;Assist for transportation    Functional Status Assessment  Patient has had a recent decline in their functional status and demonstrates the ability to make significant improvements in function in a reasonable and  predictable amount of time.  Equipment Recommendations  None recommended by OT    Recommendations for Other Services       Precautions / Restrictions Precautions Precautions: Fall Restrictions Weight Bearing Restrictions: No Other Position/Activity Restrictions: monitor O2 saturation      Mobility Bed Mobility Overal bed mobility: Modified Independent      Transfers Overall transfer level: Needs assistance Equipment used: None Transfers: Sit to/from Stand Sit to Stand: Supervision         Balance Overall balance assessment: Mild deficits observed, not formally tested        ADL either performed or assessed with clinical judgement   ADL   Eating/Feeding: Independent;Sitting Eating/Feeding Details (indicate cue type and reason): based on clinical judgement Grooming: Supervision/safety;Standing Grooming Details (indicate cue type and reason): at sink level, based on clinical judgement         Upper Body Dressing : Set up;Sitting   Lower Body Dressing: Supervision/safety Lower Body Dressing Details (indicate cue type and reason): She doffed then donned her socks seated EOB. Toilet Transfer: Supervision/safety;Ambulation Toilet Transfer Details (indicate cue type and reason): Performed at bathroom level. Toileting- Clothing Manipulation and Hygiene: Supervision/safety Toileting - Clothing Manipulation Details (indicate cue type and reason): She performed all toileting tasks at bathroom level.                       Pertinent Vitals/Pain Pain Assessment Pain Assessment: Faces Faces Pain Scale: Hurts little more Pain Location: Head ache Pain Intervention(s): Monitored during session     Extremity/Trunk Assessment Upper Extremity Assessment Upper Extremity Assessment: Overall WFL for tasks assessed. B UE AROM WFL. B UE strength  4+/5 to 5/5 throughout and appeared symmetrical.    Lower Extremity Assessment Lower Extremity Assessment: Overall WFL for  tasks assessed      Communication Communication Communication: No apparent difficulties   Cognition Arousal: Alert   Overall Cognitive Status: No family/caregiver present to determine baseline cognitive functioning      General Comments: Patient was pleasant and cooperative during session. able to answer all questions, oriented to person, place, month, year, and day of the week, able to follow 1 step commands without difficulty. Pt's medical history indicates a history of intellectual disability                 Home Living Family/patient expects to be discharged to:: Private residence Living Arrangements: Other relatives (sister and young nephew) Available Help at Discharge: Family;Available PRN/intermittently Type of Home: Apartment Home Access: Stairs to enter Entrance Stairs-Number of Steps: flight Home Layout: One level     Bathroom Shower/Tub: Tub/shower unit         Home Equipment: None   Additional Comments: patient reported that she did live  in [place] with her sister and sisters children. Now lives with foster sister.   Lives With: Family;Other (Comment) (sister and nephew)    Prior Functioning/Environment Prior Level of Function : Independent/Modified Independent             Mobility Comments: Independent with ambulation. ADLs Comments: She reported being independent with ADLs. She also reported cooking "sometimes."        OT Problem List: Cardiopulmonary status limiting activity;Decreased safety awareness      OT Treatment/Interventions: Self-care/ADL training;Therapeutic exercise;Energy conservation;DME and/or AE instruction;Therapeutic activities;Balance training;Patient/family education    OT Goals(Current goals can be found in the care plan section) Acute Rehab OT Goals OT Goal Formulation: With patient Time For Goal Achievement: 02/14/23 Potential to Achieve Goals: Good ADL Goals Pt Will Perform Grooming: Independently;standing Pt  Will Perform Lower Body Dressing: Independently;sit to/from stand Pt Will Transfer to Toilet: Independently;ambulating Pt Will Perform Toileting - Clothing Manipulation and hygiene: Independently;sit to/from stand  OT Frequency: Min 1X/week    Co-evaluation PT/OT/SLP Co-Evaluation/Treatment: Yes Reason for Co-Treatment: To address functional/ADL transfers PT goals addressed during session: Mobility/safety with mobility OT goals addressed during session: ADL's and self-care      AM-PAC OT "6 Clicks" Daily Activity     Outcome Measure Help from another person eating meals?: None Help from another person taking care of personal grooming?: None Help from another person toileting, which includes using toliet, bedpan, or urinal?: A Little Help from another person bathing (including washing, rinsing, drying)?: A Little Help from another person to put on and taking off regular upper body clothing?: None Help from another person to put on and taking off regular lower body clothing?: None 6 Click Score: 22   End of Session Equipment Utilized During Treatment: Oxygen Nurse Communication: Other (comment)  Activity Tolerance: Patient tolerated treatment well Patient left: in chair;with call bell/phone within reach;with chair alarm set  OT Visit Diagnosis: Unsteadiness on feet (R26.81)                Time: 1100-1126 OT Time Calculation (min): 26 min Charges:  OT General Charges $OT Visit: 1 Visit OT Evaluation $OT Re-eval: 1 Re-eval    Reuben Likes, OTR/L 01/31/2023, 2:25 PM

## 2023-02-01 DIAGNOSIS — I160 Hypertensive urgency: Secondary | ICD-10-CM | POA: Diagnosis not present

## 2023-02-01 LAB — CBC WITH DIFFERENTIAL/PLATELET
Abs Immature Granulocytes: 0.01 10*3/uL (ref 0.00–0.07)
Basophils Absolute: 0 10*3/uL (ref 0.0–0.1)
Basophils Relative: 1 %
Eosinophils Absolute: 0.1 10*3/uL (ref 0.0–0.5)
Eosinophils Relative: 2 %
HCT: 29.1 % — ABNORMAL LOW (ref 36.0–46.0)
Hemoglobin: 8.9 g/dL — ABNORMAL LOW (ref 12.0–15.0)
Immature Granulocytes: 0 %
Lymphocytes Relative: 17 %
Lymphs Abs: 0.7 10*3/uL (ref 0.7–4.0)
MCH: 27.4 pg (ref 26.0–34.0)
MCHC: 30.6 g/dL (ref 30.0–36.0)
MCV: 89.5 fL (ref 80.0–100.0)
Monocytes Absolute: 0.3 10*3/uL (ref 0.1–1.0)
Monocytes Relative: 9 %
Neutro Abs: 2.8 10*3/uL (ref 1.7–7.7)
Neutrophils Relative %: 71 %
Platelets: 261 10*3/uL (ref 150–400)
RBC: 3.25 MIL/uL — ABNORMAL LOW (ref 3.87–5.11)
RDW: 22.5 % — ABNORMAL HIGH (ref 11.5–15.5)
WBC: 3.9 10*3/uL — ABNORMAL LOW (ref 4.0–10.5)
nRBC: 0 % (ref 0.0–0.2)

## 2023-02-01 LAB — GLUCOSE, CAPILLARY
Glucose-Capillary: 108 mg/dL — ABNORMAL HIGH (ref 70–99)
Glucose-Capillary: 135 mg/dL — ABNORMAL HIGH (ref 70–99)
Glucose-Capillary: 121 mg/dL — ABNORMAL HIGH (ref 70–99)
Glucose-Capillary: 116 mg/dL — ABNORMAL HIGH (ref 70–99)

## 2023-02-01 LAB — BASIC METABOLIC PANEL
Anion gap: 6 (ref 5–15)
BUN: 39 mg/dL — ABNORMAL HIGH (ref 6–20)
CO2: 17 mmol/L — ABNORMAL LOW (ref 22–32)
Calcium: 8 mg/dL — ABNORMAL LOW (ref 8.9–10.3)
Chloride: 110 mmol/L (ref 98–111)
Creatinine, Ser: 4.17 mg/dL — ABNORMAL HIGH (ref 0.44–1.00)
GFR, Estimated: 12 mL/min — ABNORMAL LOW (ref >60)
Glucose, Bld: 99 mg/dL (ref 70–99)
Potassium: 5.1 mmol/L (ref 3.5–5.1)
Sodium: 133 mmol/L — ABNORMAL LOW (ref 135–145)

## 2023-02-01 LAB — PHOSPHORUS: Phosphorus: 4.4 mg/dL (ref 2.5–4.6)

## 2023-02-01 LAB — PROTEIN / CREATININE RATIO, URINE
Creatinine, Urine: 101 mg/dL
Total Protein, Urine: >3000 mg/dL

## 2023-02-01 LAB — MAGNESIUM: Magnesium: 1.9 mg/dL (ref 1.7–2.4)

## 2023-02-01 NOTE — Progress Notes (Signed)
Mobility Specialist - Progress Note   02/01/23 0943  Oxygen Therapy  SpO2 90 %  O2 Device Nasal Cannula  O2 Flow Rate (L/min) 2 L/min  Patient Activity (if Appropriate) Ambulating  Mobility  Activity Ambulated independently in hallway  Level of Assistance Independent  Assistive Device None  Distance Ambulated (ft) 350 ft  Activity Response Tolerated well  Mobility Referral Yes  $Mobility charge 1 Mobility  Mobility Specialist Start Time (ACUTE ONLY) [id number]  Mobility Specialist Stop Time (ACUTE ONLY) 0944  Mobility Specialist Time Calculation (min) (ACUTE ONLY) 21 min   Pt received in bed and agreeable to mobility. Pt took x2 standing rest breaks due to SOB. No complaints during session. Pt to bed after session with all needs met. Bed alarm on.    Pre-mobility: 97% SpO2 (2L [place]) During mobility: 90% SpO2 (2L [place]) Post-mobility: 95% SPO2 (2L [place])  Chief Technology Officer

## 2023-02-01 NOTE — Plan of Care (Signed)

## 2023-02-01 NOTE — Consult Note (Signed)
Nephrology Consult   Requesting provider: Dorcas Carrow Service requesting consult: Hospitalist Reason for consult: CKD   Assessment/Recommendations: Joanna [name] is a/an 52 y.o. female with a past medical history intellectual disability, DM2, CKD, HTN, obesity who presents with back pain found to have progressive CKD  AKI on Progressive CKD 4: History of CKD with baseline around 1.83 years ago.  Now with creatinine around 4.  I think this likely represents progressive CKD but calling AKI given we do not have baseline.  CKD is most likely arterionephrosclerosis and diabetic kidney disease.  I do not think she needs to remain in the hospital. -We will continue following her tomorrow if she remains in the hospital -Can continue to monitor creatinine daily -I have already messaged our staff about setting up an outpatient visit -Will obtain UPC given proteinuria on urinalysis -Hypertension management as below -Continue to monitor daily Cr, Dose meds for GFR -Monitor Daily I/Os, Daily weight  -Maintain MAP>65 for optimal renal perfusion.  -Avoid nephrotoxic medications including NSAIDs -Use synthetic opioids (Fentanyl/Dilaudid) if needed -Currently no indication for HD  Hypertension: Unlikely significant concern and likely at baseline.  Continue current medications with amlodipine, carvedilol, hydralazine, Imdur.  We will consider further titration of medications in the outpatient setting.  Intellectual disability: Current mental status post to be at baseline.  Defer to primary  Stroke: Management per primary team and neurology.  Anemia: decreased during hospital stay. Obtain iron studies. Could consider ESA outpatient  Uncontrolled Diabetes Mellitus Type 2 with Hyperglycemia: mgmt per primary    Recommendations conveyed to primary service.    [facility] 02/01/2023 1:33  PM   _____________________________________________________________________________________ CC: Back pain  History of Present Illness: Joanna [name] is a/an 52 y.o. female with a past medical history of intellectual disability, DM2, CKD, HTN, obesity who presents with back pain.  Because of the patient's intellectual disability history was limited mostly obtained from chart review.  Patient presented to the hospital 4 days ago for back pain.  She states that her back had been hurting for several days prior to arrival and she thought it was her kidney.  She moved to [place] recently to be with family.  She lives with her sister and her niece.  CT scan of the abdomen in the emergency department was without major concern.  Creatinine was 3.8 without recent baseline however creatinine was 1.8 about 3 years ago.  She was admitted for further management of possible AKI.  During her hospitalization she had worsening confusion and was found to have a small stroke.  She also had some elevated blood pressures.  She was started on hydralazine during the hospitalization.  Patient states she feels fairly well today.  Denies NSAID use, fevers, chills, shortness of breath, chest pain, nausea, vomiting, diarrhea, dysuria, hematuria.  She does not know if she is seeing a kidney doctor regularly.  Does appear that she saw [place] in clinic about 2 years ago.   Medications:  Current Facility-Administered Medications  Medication Dose Route Frequency Provider Last Rate Last Admin   acetaminophen (TYLENOL) tablet 500 mg  500 mg Oral Q8H PRN Buena Irish, MD   500 mg at 01/31/23 2035   amLODipine (NORVASC) tablet 10 mg  10 mg Oral Daily Buena Irish, MD   10 mg at 02/01/23 1020   aspirin suppository 300 mg  300 mg Rectal Daily Dorcas Carrow, MD       Or   aspirin tablet 325 mg  325  mg Oral Daily Dorcas Carrow, MD   325 mg at 02/01/23 1020   atorvastatin (LIPITOR) tablet 40 mg  40 mg Oral Daily Dorcas Carrow,  MD   40 mg at 02/01/23 1020   carvedilol (COREG) tablet 6.25 mg  6.25 mg Oral BID WC Buena Irish, MD   6.25 mg at 02/01/23 5621   Chlorhexidine Gluconate Cloth 2 % PADS 6 each  6 each Topical QHS Buena Irish, MD   6 each at 01/31/23 2048   clopidogrel (PLAVIX) tablet 75 mg  75 mg Oral Daily Dorcas Carrow, MD   75 mg at 02/01/23 1020   heparin injection 5,000 Units  5,000 Units Subcutaneous Q8H Buena Irish, MD   5,000 Units at 02/01/23 3086   hydrALAZINE (APRESOLINE) injection 10 mg  10 mg Intravenous Q6H PRN Buena Irish, MD   10 mg at 01/31/23 1024   hydrALAZINE (APRESOLINE) tablet 100 mg  100 mg Oral Q8H Ghimire, Lyndel Safe, MD   100 mg at 02/01/23 0522   insulin aspart (novoLOG) injection 0-6 Units  0-6 Units Subcutaneous TID WC Buena Irish, MD       isosorbide dinitrate (ISORDIL) tablet 10 mg  10 mg Oral TID Buena Irish, MD   10 mg at 02/01/23 1020   labetalol (NORMODYNE) injection 10 mg  10 mg Intravenous Q4H PRN Buena Irish, MD   10 mg at 01/30/23 2359   lidocaine (LIDODERM) 5 % 1 patch  1 patch Transdermal Q24H Rondel Baton, MD   1 patch at 01/31/23 1422   mupirocin ointment (BACTROBAN) 2 %   Nasal BID Buena Irish, MD   1 Application at 02/01/23 1020   Muscle Rub CREA 1 Application  1 Application Topical PRN Dorcas Carrow, MD   1 Application at 01/31/23 2046   ondansetron (ZOFRAN) tablet 4 mg  4 mg Oral Q6H PRN Buena Irish, MD   4 mg at 01/30/23 2352   Or   ondansetron (ZOFRAN) injection 4 mg  4 mg Intravenous Q6H PRN Buena Irish, MD   4 mg at 01/30/23 1224   Oral care mouth rinse  15 mL Mouth Rinse PRN Buena Irish, MD       oxyCODONE (Oxy IR/ROXICODONE) immediate release tablet 2.5 mg  2.5 mg Oral Q6H PRN Buena Irish, MD   2.5 mg at 01/30/23 1230   sorbitol 70 % solution 30 mL  30 mL Oral Daily PRN Buena Irish, MD         ALLERGIES Patient has no known allergies.  MEDICAL HISTORY Past Medical  History:  Diagnosis Date   Acute diastolic heart failure (HCC) 12/16/2019   Acute respiratory failure with hypoxia (HCC) 12/16/2019   AKI (acute kidney injury) (HCC) 12/16/2019   Anasarca 12/16/2019   CAD (coronary artery disease)    Diabetes mellitus without complication (HCC)    HTN (hypertension) 12/16/2019   Hypertension    Hypertensive urgency 12/16/2019   Intellectual disability 12/16/2019   Morbid obesity (HCC)    New onset of congestive heart failure (HCC) 12/16/2019   Obesity, Class III, BMI 40-49.9 (morbid obesity) (HCC) 12/16/2019   Type 2 diabetes mellitus without complication (HCC) 12/16/2019     SOCIAL HISTORY Social History   Socioeconomic History   Marital status: Single    Spouse name: Not on file   Number of children: 0   Years of education: Not on file   Highest education level: 9th grade  Occupational History   Not on file  Tobacco Use  Smoking status: Never   Smokeless tobacco: Never  Substance and Sexual Activity   Alcohol use: Not Currently    Comment: socially   Drug use: Never   Sexual activity: Not on file  Other Topics Concern   Not on file  Social History Narrative   07/26/21 lives with caregiver   Social Determinants of Health   Financial Resource Strain: Medium Risk (11/30/2022)   Received from [facility]   Overall Financial Resource Strain (CARDIA)    Difficulty of Paying Living Expenses: Somewhat hard  Food Insecurity: No Food Insecurity (01/29/2023)   Hunger Vital Sign    Worried About Running Out of Food in the Last Year: Never true    Ran Out of Food in the Last Year: Never true  Transportation Needs: No Transportation Needs (01/29/2023)   PRAPARE - Administrator, Civil Service (Medical): No    Lack of Transportation (Non-Medical): No  Recent Concern: Transportation Needs - Unmet Transportation Needs (11/30/2022)   Received from [facility] - Transportation    Lack of Transportation (Medical): Yes     Lack of Transportation (Non-Medical): No  Physical Activity: Insufficiently Active (11/30/2022)   Received from [facility]   Exercise Vital Sign    Days of Exercise per Week: 2 days    Minutes of Exercise per Session: 20 min  Stress: No Stress Concern Present (11/30/2022)   Received from [facility] - Occupational Stress Questionnaire    Feeling of Stress : Not at all  Social Connections: Socially Integrated (11/30/2022)   Received from [facility]   Social Network    How would you rate your social network (family, work, friends)?: Good participation with social networks  Intimate Partner Violence: Not At Risk (01/29/2023)   Humiliation, Afraid, Rape, and Kick questionnaire    Fear of Current or Ex-Partner: No    Emotionally Abused: No    Physically Abused: No    Sexually Abused: No     FAMILY HISTORY Family History  Problem Relation Age of Onset   Heart disease Mother        Enlarged heart per patient   Hypertension Mother    Diabetes Mother    Hypertension Sister    Diabetes Sister       Review of Systems: 12 systems reviewed Otherwise as per HPI, all other systems reviewed and negative  Physical Exam: Vitals:   02/01/23 1202 02/01/23 1208  BP:  (!) 166/89  Pulse:  77  Resp:  20  Temp: 97.8 F (36.6 C)   SpO2:  96%   Total I/O In: 240 [P.O.:240] Out: -   Intake/Output Summary (Last 24 hours) at 02/01/2023 1333 Last data filed at 02/01/2023 1610 Gross per 24 hour  Intake 360 ml  Output --  Net 360 ml   General: well-appearing, no acute distress HEENT: anicteric sclera, oropharynx clear without lesions CV: normal rate, no rub Lungs: clear to auscultation bilaterally, normal work of breathing Abd: soft, non-tender, non-distended Skin: no visible lesions or rashes Psych: alert, engaged, appropriate mood and affect Musculoskeletal: no obvious deformities Neuro: at times speech is non-sensical or tangential,  interactive, alert, responds to questioning  Test Results Reviewed Lab Results  Component Value Date   NA 133 (L) 02/01/2023   K 5.1 02/01/2023   CL 110 02/01/2023   CO2 17 (L) 02/01/2023   BUN 39 (H) 02/01/2023   CREATININE 4.17 (H) 02/01/2023  CALCIUM 8.0 (L) 02/01/2023   ALBUMIN 2.2 (L) 12/22/2019   PHOS 4.4 02/01/2023    CBC Recent Labs  Lab 01/28/23 1436 01/29/23 0320 02/01/23 0313  WBC 3.9* 4.7 3.9*  NEUTROABS  --   --  2.8  HGB 10.7* 9.3* 8.9*  HCT 34.4* 30.5* 29.1*  MCV 86.2 89.2 89.5  PLT 286 258 261    I have reviewed all relevant outside healthcare records related to the patient's current hospitalization     The patient is critically ill with Hypertension and AKI on CKD 4 and which includes my role to primarily manage AKI on CKD 4.  This requires high complexity decision making.  Total critical care time: 42 min    Critical care time was exclusive of treating other patients.   Critical care was necessary to treat or prevent imminent or life-threatening deterioration.   Critical care was time spent personally by me on the following activities:   development of treatment plan with patient and/or surrogate as well as nursing,   discussions with other provider evaluation of patient's response to treatment  examination of patient  obtaining history from patient or surrogate  ordering and performing treatments and interventions  ordering and review of laboratory studies  ordering and review of radiographic studies

## 2023-02-01 NOTE — Progress Notes (Signed)
PROGRESS NOTE    Joanna [name]  NWG:[id number] DOB: 1971-04-29 DOA: 01/28/2023 PCP: Hillery Aldo, NP    Brief Narrative:  52 year old with intellectual disability, type 2 diabetes, CKD and recent status unknown, hypertension, obesity presented from home with 5 days of back pain.  Poor historian.  Patient reported  back pain started when she was reaching for her remote and twisted her back.  Patient just moved to [place] with her sister and niece. In the emergency room CT scan abdomen pelvis was done without evidence of hydronephrosis or urinary retention, without evidence of aortic dissection.  Blood pressures were elevated.  Creatinine was 3.8, last known creatinine 1. 76 more than 3 years ago.  Admitted with AKI, back pain. 3/14, medically stabilizing.  Code stroke was called for patient's paraphrasing and confusion.  See details below. She did have small right lacunar stroke.  Patient recovered from lacunar stroke but her renal functions are still not recovered.  Consulted nephrology 8/16.   Assessment & Plan:   Acute right MCA ischemic stroke: Not present on admission.  Happened while under treatment. Symptoms, acute onset altered mental status, paraphrasing and confusion.  Difficult symptoms because of her underlying intellectual difficulties.  Symptoms did not last more than 1 hour. CT head findings, no acute findings MRI of the brain, chronic microvascular changes along with very small acute infarct right internal capsule Carotid Doppler, less than 39% stenosis bilateral carotids. 2D echocardiogram, ejection fraction 50 to 55%, no source of thrombus.  Antiplatelet therapy, none at home.  Neurology recommended aspirin Plavix for 3 weeks then aspirin alone LDL 153 on 6/28 . Will start patient on Lipitor 40 mg daily. Hemoglobin A1c, 6.  Well-controlled.  On Amaryl at home, stopped due to hypoglycemia.  Therapy recommendations, home health PT/OT  Hypertensive urgency: Blood pressures  still elevated.   Already on amlodipine, Coreg and Isordil.  Dose of hydralazine increased.  Will monitor.    AKI in a patient with CKD, recent labs unknown.  Patient likely has progressive kidney disease. No evidence of uremia. Last known creatinine 1.76, No evidence of hydronephrosis or urinary retention on her abdominal CT scan. Monitoring of IV fluids, creatinine elevated today.   I have called and discussed case with nephrology for consultation.   Patient will need local nephrology follow-up in [place].  Will need referral to nephrology on discharge.  Back pain: Probably musculoskeletal.  Symptomatic management.  Lidocaine patches.  Pain medications.  Improved.  Type 2 diabetes with hypoglycemia: On Amaryl at home.  Discontinued due to blood sugars less than 60.  Currently on sliding scale insulin.  A1c 6.  Will hold off on treatment.  Nocturnal hypoxemia: Probably due to obesity hypoventilation.  Overnight the study shows desaturations less than 89%.  She will be prescribed supplemental oxygen on discharge.  She also qualifies for supplemental oxygen with mobility.  Patient clinically stabilizing, however creatinine trending up.  Will need nephrology inpatient evaluation we were planning for discharge.  She will be going to her sister's house on discharge with home health PT OT.    DVT prophylaxis: heparin injection 5,000 Units Start: 01/29/23 0600   Code Status: Full code Family Communication: Sister called, unable to pick up phone today. Disposition Plan: Status is: Inpatient Remains inpatient appropriate because: Significant renal function dysfunction,     Consultants:  Neurology, telestroke Nephrology  Procedures:  None  Antimicrobials:  None   Subjective:  Patient seen and examined.  Pleasant and interactive.  Denies any complaints  today.  Every time I walk and she asked for a bag of chips.  No overnight events.  Objective: Vitals:   02/01/23 1023 02/01/23  1100 02/01/23 1202 02/01/23 1208  BP: (!) 179/86 (!) 161/82  (!) 166/89  Pulse: 83 83  77  Resp: (!) 22 19  20   Temp:   97.8 F (36.6 C)   TempSrc:   Oral   SpO2: 99% 97%  96%  Weight:      Height:        Intake/Output Summary (Last 24 hours) at 02/01/2023 1227 Last data filed at 02/01/2023 1610 Gross per 24 hour  Intake 360 ml  Output --  Net 360 ml   Filed Weights   01/28/23 1239  Weight: 90.9 kg    Examination:  General exam: Appears calm and comfortable  Alert awake and oriented to herself and situation.  Patient does have some baseline intellectual disability.  She is very interactive and answers appropriate questions, occasionally repeats and paraparesis that is normal for her. No cranial nerve deficits. No focal neurological deficits. Respiratory system: Clear to auscultation. Respiratory effort normal. Cardiovascular system: S1 & S2 heard, RRR. No JVD, murmurs, rubs, gallops or clicks. No pedal edema. Gastrointestinal system: Abdomen is nondistended, soft and nontender. No organomegaly or masses felt. Normal bowel sounds heard.    Data Reviewed: I have personally reviewed following labs and imaging studies  CBC: Recent Labs  Lab 01/28/23 1436 01/29/23 0320 02/01/23 0313  WBC 3.9* 4.7 3.9*  NEUTROABS  --   --  2.8  HGB 10.7* 9.3* 8.9*  HCT 34.4* 30.5* 29.1*  MCV 86.2 89.2 89.5  PLT 286 258 261   Basic Metabolic Panel: Recent Labs  Lab 01/28/23 1436 01/29/23 0320 01/30/23 1303 02/01/23 0313  NA 139 139 138 133*  K 4.2 5.0 4.2 5.1  CL 108 109 113* 110  CO2 20* 20* 17* 17*  GLUCOSE 89 119* 110* 99  BUN 43* 43* 36* 39*  CREATININE 3.93* 3.84* 3.59* 4.17*  CALCIUM 9.1 8.3* 7.1* 8.0*  MG  --   --   --  1.9  PHOS  --   --   --  4.4   GFR: Estimated Creatinine Clearance: 16.9 mL/min (A) (by C-G formula based on SCr of 4.17 mg/dL (H)). Liver Function Tests: No results for input(s): "AST", "ALT", "ALKPHOS", "BILITOT", "PROT", "ALBUMIN" in the last  168 hours. No results for input(s): "LIPASE", "AMYLASE" in the last 168 hours. Recent Labs  Lab 01/30/23 1303  AMMONIA <10   Coagulation Profile: No results for input(s): "INR", "PROTIME" in the last 168 hours. Cardiac Enzymes: No results for input(s): "CKTOTAL", "CKMB", "CKMBINDEX", "TROPONINI" in the last 168 hours. BNP (last 3 results) No results for input(s): "PROBNP" in the last 8760 hours. HbA1C: No results for input(s): "HGBA1C" in the last 72 hours.  CBG: Recent Labs  Lab 01/31/23 1136 01/31/23 1708 01/31/23 2131 02/01/23 0721 02/01/23 1112  GLUCAP 142* 108* 133* 108* 135*   Lipid Profile: No results for input(s): "CHOL", "HDL", "LDLCALC", "TRIG", "CHOLHDL", "LDLDIRECT" in the last 72 hours. Thyroid Function Tests: No results for input(s): "TSH", "T4TOTAL", "FREET4", "T3FREE", "THYROIDAB" in the last 72 hours. Anemia Panel: No results for input(s): "VITAMINB12", "FOLATE", "FERRITIN", "TIBC", "IRON", "RETICCTPCT" in the last 72 hours. Sepsis Labs: No results for input(s): "PROCALCITON", "LATICACIDVEN" in the last 168 hours.  Recent Results (from the past 240 hour(s))  MRSA Next Gen by PCR, Nasal     Status: Abnormal  Collection Time: 01/28/23 10:50 PM   Specimen: Nasal Mucosa; Nasal Swab  Result Value Ref Range Status   MRSA by PCR Next Gen DETECTED (A) NOT DETECTED Final    Comment: (NOTE) The GeneXpert MRSA Assay (FDA approved for NASAL specimens only), is one component of a comprehensive MRSA colonization surveillance program. It is not intended to diagnose MRSA infection nor to guide or monitor treatment for MRSA infections. Test performance is not FDA approved in patients less than 46 years old. Performed at [facility] [address], [place], [place] [address]          Radiology Studies: ECHOCARDIOGRAM COMPLETE BUBBLE STUDY  Result Date: 01/31/2023    ECHOCARDIOGRAM REPORT   Patient Name:   Earlean Garrott  Date of Exam:  01/31/2023 Medical Rec #:  [id number]  Height:       62.0 in Accession #:    [id number] Weight:       200.4 lb Date of Birth:  03-04-1971  BSA:          1.913 m Patient Age:    52 years   BP:           160/93 mmHg Patient Gender: F          HR:           80 bpm. Exam Location:  Inpatient Procedure: 2D Echo, Cardiac Doppler, Color Doppler, Strain Analysis and Saline            Contrast Bubble Study Indications:    Stroke  History:        Patient has prior history of Echocardiogram examinations, most                 recent 12/17/2019. CHF, Stroke, Arrythmias:Ventricular                 Fibrillation, Signs/Symptoms:Altered Mental Status; Risk                 Factors:Diabetes and Hypertension. Pericardial effusion.  Sonographer:    Sheralyn Boatman RDCS Referring Phys: [id number] [facility]  Sonographer Comments: Image acquisition challenging due to patient body habitus. IMPRESSIONS  1. Left ventricular ejection fraction, by estimation, is 50 to 55%. The left ventricle has low normal function. The left ventricle has no regional wall motion abnormalities. There is moderate left ventricular hypertrophy. Left ventricular diastolic parameters are consistent with Grade III diastolic dysfunction (restrictive).  2. Right ventricular systolic function is moderately reduced. The right ventricular size is moderately enlarged. Moderately increased right ventricular wall thickness. There is moderately elevated pulmonary artery systolic pressure. The estimated right ventricular systolic pressure is 48.2 mmHg.  3. Left atrial size was severely dilated.  4. Right atrial size was moderately dilated.  5. A small pericardial effusion is present. The pericardial effusion is circumferential. There is no evidence of cardiac tamponade.  6. The mitral valve is degenerative. Mild mitral valve regurgitation. No evidence of mitral stenosis.  7. The aortic valve is tricuspid. Aortic valve regurgitation is trivial. No aortic stenosis is present.  8. The  pulmonic valve was doming.  9. The inferior vena cava is dilated in size with <50% respiratory variability, suggesting right atrial pressure of 15 mmHg. 10. Agitated saline contrast bubble study was positive with shunting observed within 3-6 cardiac cycles suggestive of interatrial shunt. Conclusion(s)/Recommendation(s): There is significan biventricular hypertrophy with mild to moderate biventricualr dysfunction and a small pericardial effusion. Would recommend further assessment to evalaute for infiltrative cardiomyopathy (i.e. amyloid).  Bubble study is very weakl positive. FINDINGS  Left Ventricle: Left ventricular ejection fraction, by estimation, is 50 to 55%. The left ventricle has low normal function. The left ventricle has no regional wall motion abnormalities. The left ventricular internal cavity size was normal in size. There is moderate left ventricular hypertrophy. Left ventricular diastolic parameters are consistent with Grade III diastolic dysfunction (restrictive). Right Ventricle: The right ventricular size is moderately enlarged. Moderately increased right ventricular wall thickness. Right ventricular systolic function is moderately reduced. There is moderately elevated pulmonary artery systolic pressure. The tricuspid regurgitant velocity is 2.88 m/s, and with an assumed right atrial pressure of 15 mmHg, the estimated right ventricular systolic pressure is 48.2 mmHg. Left Atrium: Left atrial size was severely dilated. Right Atrium: Right atrial size was moderately dilated. Pericardium: A small pericardial effusion is present. The pericardial effusion is circumferential. There is no evidence of cardiac tamponade. Mitral Valve: The mitral valve is degenerative in appearance. Mild mitral valve regurgitation. No evidence of mitral valve stenosis. MV peak gradient, 7.1 mmHg. The mean mitral valve gradient is 3.0 mmHg. Tricuspid Valve: The tricuspid valve is grossly normal. Tricuspid valve regurgitation  is mild . No evidence of tricuspid stenosis. Aortic Valve: The aortic valve is tricuspid. Aortic valve regurgitation is trivial. No aortic stenosis is present. Pulmonic Valve: The pulmonic valve was doming in systole. Pulmonic valve regurgitation is mild to moderate. No evidence of pulmonic stenosis. Aorta: The aortic root and ascending aorta are structurally normal, with no evidence of dilitation. Venous: The inferior vena cava is dilated in size with less than 50% respiratory variability, suggesting right atrial pressure of 15 mmHg. IAS/Shunts: There is right bowing of the interatrial septum, suggestive of elevated left atrial pressure. No atrial level shunt detected by color flow Doppler. Agitated saline contrast was given intravenously to evaluate for intracardiac shunting. Agitated saline contrast bubble study was positive with shunting observed within 3-6 cardiac cycles suggestive of interatrial shunt.  LEFT VENTRICLE PLAX 2D LVIDd:         4.70 cm      Diastology LVIDs:         3.35 cm      LV e' medial:    4.65 cm/s LV PW:         1.60 cm      LV E/e' medial:  25.4 LV IVS:        1.45 cm      LV e' lateral:   4.65 cm/s LVOT diam:     2.00 cm      LV E/e' lateral: 25.4 LV SV:         71 LV SV Index:   37 LVOT Area:     3.14 cm  LV Volumes (MOD) LV vol d, MOD A2C: 115.0 ml LV vol d, MOD A4C: 90.6 ml LV vol s, MOD A2C: 54.7 ml LV vol s, MOD A4C: 43.6 ml LV SV MOD A2C:     60.3 ml LV SV MOD A4C:     90.6 ml LV SV MOD BP:      55.1 ml RIGHT VENTRICLE            IVC RV S prime:     9.68 cm/s  IVC diam: 2.40 cm TAPSE (M-mode): 1.9 cm LEFT ATRIUM             Index        RIGHT ATRIUM           Index LA diam:  5.10 cm 2.67 cm/m   RA Area:     19.70 cm LA Vol (A2C):   84.0 ml 43.90 ml/m  RA Volume:   53.80 ml  28.12 ml/m LA Vol (A4C):   62.7 ml 32.77 ml/m LA Biplane Vol: 75.9 ml 39.67 ml/m  AORTIC VALVE             PULMONIC VALVE LVOT Vmax:   126.00 cm/s PR End Diast Vel: 2.76 msec LVOT Vmean:  82.700  cm/s LVOT VTI:    0.226 m  AORTA Ao Root diam: 2.90 cm Ao Asc diam:  3.50 cm MITRAL VALVE                TRICUSPID VALVE MV Area (PHT): 5.27 cm     TR Peak grad:   33.2 mmHg MV Area VTI:   2.35 cm     TR Vmax:        288.00 cm/s MV Peak grad:  7.1 mmHg MV Mean grad:  3.0 mmHg     SHUNTS MV Vmax:       1.33 m/s     Systemic VTI:  0.23 m MV Vmean:      77.7 cm/s    Systemic Diam: 2.00 cm MV Decel Time: 144 msec MV E velocity: 118.00 cm/s MV A velocity: 56.90 cm/s MV E/A ratio:  2.07 Arvilla Meres MD Electronically signed by Arvilla Meres MD Signature Date/Time: 01/31/2023/12:00:46 PM    Final    MR ANGIO HEAD WO CONTRAST  Result Date: 01/30/2023 CLINICAL DATA:  Stroke follow-up.  Confusion and weakness. EXAM: MRA HEAD WITHOUT CONTRAST TECHNIQUE: Angiographic images of the Circle of Willis were acquired using MRA technique without intravenous contrast. COMPARISON:  Brain MRI from earlier today FINDINGS: Anterior circulation: Moderate narrowing at a right M2 origin. No beading or aneurysm. No vascular malformation. Posterior circulation: Mild left vertebral artery dominance. The vertebral artery on the right is mildly irregular which is likely atheromatous. No flow reducing stenosis in the vertebral or basilar arteries. No branch occlusion, beading, or aneurysm. Mild branch vessel atheromatous irregularity. IMPRESSION: 1. No emergent arterial finding. 2. Intracranial atherosclerosis most notably causing a moderate right M2 origin stenosis. Electronically Signed   By: Tiburcio Pea M.D.   On: 01/30/2023 16:49   VAS US CAROTID (at [facility] and [facility] only)  Result Date: 01/30/2023 Carotid Arterial Duplex Study Patient Name:  LAYLI SWILLEY  Date of Exam:   01/30/2023 Medical Rec #: [id number]  Accession #:    [id number] Date of Birth: 02-Aug-1970  Patient Gender: F Patient Age:   51 years Exam Location:  [facility] Procedure:      VAS US CAROTID Referring Phys: Dorcas Carrow  --------------------------------------------------------------------------------  Indications:       Carotid stenosis-bilateral I65.23. Risk Factors:      Hypertension, Diabetes. Limitations        Today's exam was limited due to the body habitus of the                    patient, the patient's respiratory variation and patient                    somnolence, patient movement, patient positioning. Comparison Study:  No prior studies. Performing Technologist: Chanda Busing RVT  Examination Guidelines: A complete evaluation includes B-mode imaging, spectral Doppler, color Doppler, and power Doppler as needed of all accessible portions of each vessel. Bilateral testing is considered an integral part of a complete  examination. Limited examinations for reoccurring indications may be performed as noted.  Right Carotid Findings: +----------+--------+--------+--------+-----------------------+--------+           PSV cm/sEDV cm/sStenosisPlaque Description     Comments +----------+--------+--------+--------+-----------------------+--------+ CCA Prox  116     21              smooth and heterogenous         +----------+--------+--------+--------+-----------------------+--------+ CCA Distal69      19              smooth and heterogenous         +----------+--------+--------+--------+-----------------------+--------+ ICA Prox  78      39              smooth and heterogenous         +----------+--------+--------+--------+-----------------------+--------+ ICA Mid   91      41              smooth and heterogenous         +----------+--------+--------+--------+-----------------------+--------+ ICA Distal62      36                                     tortuous +----------+--------+--------+--------+-----------------------+--------+ ECA       80      14                                              +----------+--------+--------+--------+-----------------------+--------+  +----------+--------+-------+--------+-------------------+           PSV cm/sEDV cmsDescribeArm Pressure (mmHG) +----------+--------+-------+--------+-------------------+ [id number]                                        +----------+--------+-------+--------+-------------------+ +---------+--------+--+--------+--+---------+ VertebralPSV cm/s39EDV cm/s10Antegrade +---------+--------+--+--------+--+---------+  Left Carotid Findings: +----------+--------+--------+--------+-----------------------+--------+           PSV cm/sEDV cm/sStenosisPlaque Description     Comments +----------+--------+--------+--------+-----------------------+--------+ CCA Prox  145     28              smooth and heterogenous         +----------+--------+--------+--------+-----------------------+--------+ CCA Mid   149     38                                              +----------+--------+--------+--------+-----------------------+--------+ CCA Distal85      17              smooth and heterogenous         +----------+--------+--------+--------+-----------------------+--------+ ICA Prox  86      27              smooth and heterogenous         +----------+--------+--------+--------+-----------------------+--------+ ICA Mid                                                  tortuous +----------+--------+--------+--------+-----------------------+--------+ ICA Distal91      49  tortuous +----------+--------+--------+--------+-----------------------+--------+ ECA       126     14                                              +----------+--------+--------+--------+-----------------------+--------+ +----------+--------+--------+--------+-------------------+           PSV cm/sEDV cm/sDescribeArm Pressure (mmHG) +----------+--------+--------+--------+-------------------+ [id number]                                          +----------+--------+--------+--------+-------------------+ +---------+--------+--+--------+--+---------+ VertebralPSV cm/s89EDV cm/s36Antegrade +---------+--------+--+--------+--+---------+   Summary: Right Carotid: Velocities in the right ICA are consistent with a 1-39% stenosis. Left Carotid: Velocities in the left ICA are consistent with a 1-39% stenosis. Vertebrals: Bilateral vertebral arteries demonstrate antegrade flow. *See table(s) above for measurements and observations.  Electronically signed by Delia Heady MD on 01/30/2023 at 3:12:12 PM.    Final         Scheduled Meds:  amLODipine  10 mg Oral Daily   aspirin  300 mg Rectal Daily   Or   aspirin  325 mg Oral Daily   atorvastatin  40 mg Oral Daily   carvedilol  6.25 mg Oral BID WC   Chlorhexidine Gluconate Cloth  6 each Topical QHS   clopidogrel  75 mg Oral Daily   heparin  5,000 Units Subcutaneous Q8H   hydrALAZINE  100 mg Oral Q8H   insulin aspart  0-6 Units Subcutaneous TID WC   isosorbide dinitrate  10 mg Oral TID   lidocaine  1 patch Transdermal Q24H   mupirocin ointment   Nasal BID   Continuous Infusions:     LOS: 4 days    Time spent: 35 minutes    Dorcas Carrow, MD [facility]

## 2023-02-01 NOTE — Progress Notes (Signed)
Physical Therapy Treatment Patient Details Name: Joanna [name] MRN: [id number] DOB: March 11, 1971 Today's Date: 02/01/2023   History of Present Illness Patient is a 52 year old female who presented from home with 5 day history of back pain. Patient was admitted with hypertensive urgency, AKI and back pain. PMH: type II DM, intellectual disability, obesity. Code stroke on 8/14, WUJ:[id number] punctate right internal capsule infarct with extensive changes of chronic small vessel disease.  Patient has no focal neurological symptoms on the left side this is likely a silent incidental infarct per neurology    PT Comments  General Comments: Pt Hx intellectual Disability pleasant and cooperative. Able to express needs.  A little impulsive. Pt amb with Mobility Specialist this am and agreed to amb again.  "Sure I will walk".  OOB.  General bed mobility comments: self able with caution to lines as pt is "quick" to move and litterally "jumped" OOB to standing. Assisted to bathroom where pt was self able to perform all hygiene needs.  General Gait Details: hand held assist amb in hallway with RA range from 94 - 86% VC's to "slow" gait speed and "breath" as pt is too quick to move then RR increases to low 40's.  Instructed to "PACE" herself. Assisted back to bed per pt request. RA 96% Pt plans to return home with her Sister when medically ready.     If plan is discharge home, recommend the following: A little help with bathing/dressing/bathroom;Assistance with cooking/housework;Help with stairs or ramp for entrance;Assist for transportation   Can travel by private vehicle        Equipment Recommendations  None recommended by PT    Recommendations for Other Services       Precautions / Restrictions Precautions Precautions: Fall Restrictions Weight Bearing Restrictions: No Other Position/Activity Restrictions: monitor O2 saturation     Mobility  Bed Mobility Overal bed mobility: Modified Independent              General bed mobility comments: self able with caution to lines as pt is "quick" to move and litterally "jumped" OOB to standing.    Transfers Overall transfer level: Needs assistance Equipment used: None Transfers: Sit to/from Stand Sit to Stand: Modified independent (Device/Increase time)           General transfer comment: quick and impulsive but functional    Ambulation/Gait Ambulation/Gait assistance: Supervision Gait Distance (Feet): 220 Feet Assistive device: None Gait Pattern/deviations: Step-through pattern, Drifts right/left Gait velocity: WFL     General Gait Details: hand held assist amb in hallway with RA range from 94 - 86% VC's to "slow" gait speed and "breath" as pt is too quick to move then RR increases to low 40's.  Instructed to "PACE" herself.   Stairs             Wheelchair Mobility     Tilt Bed    Modified Rankin (Stroke Patients Only)       Balance                                            Cognition   Behavior During Therapy: WFL for tasks assessed/performed Overall Cognitive Status: Within Functional Limits for tasks assessed  General Comments: Pt Hx intellectual Disability pleasant and cooperative. Able to express needs.  A little impulsive.        Exercises      General Comments        Pertinent Vitals/Pain Pain Assessment Pain Assessment: No/denies pain    Home Living                          Prior Function            PT Goals (current goals can now be found in the care plan section) Progress towards PT goals: Progressing toward goals    Frequency    Min 1X/week      PT Plan      Co-evaluation              AM-PAC PT "6 Clicks" Mobility   Outcome Measure  Help needed turning from your back to your side while in a flat bed without using bedrails?: None Help needed moving from lying on your back to sitting on  the side of a flat bed without using bedrails?: None Help needed moving to and from a bed to a chair (including a wheelchair)?: None Help needed standing up from a chair using your arms (e.g., wheelchair or bedside chair)?: None Help needed to walk in hospital room?: A Little Help needed climbing 3-5 steps with a railing? : A Little 6 Click Score: 22    End of Session Equipment Utilized During Treatment: Gait belt Activity Tolerance: Patient tolerated treatment well Patient left: in bed;with call bell/phone within reach;with family/visitor present Nurse Communication: Mobility status PT Visit Diagnosis: Unsteadiness on feet (R26.81);Difficulty in walking, not elsewhere classified (R26.2)     Time: 6213-0865 PT Time Calculation (min) (ACUTE ONLY): 16 min  Charges:    $Gait Training: 8-22 mins PT General Charges $$ ACUTE PT VISIT: 1 Visit                     Felecia Shelling  PTA Acute  Rehabilitation Services Office M-F          [phone]

## 2023-02-02 DIAGNOSIS — I160 Hypertensive urgency: Secondary | ICD-10-CM | POA: Diagnosis not present

## 2023-02-02 LAB — BASIC METABOLIC PANEL
Anion gap: 7 (ref 5–15)
BUN: 40 mg/dL — ABNORMAL HIGH (ref 6–20)
CO2: 17 mmol/L — ABNORMAL LOW (ref 22–32)
Calcium: 8.6 mg/dL — ABNORMAL LOW (ref 8.9–10.3)
Chloride: 111 mmol/L (ref 98–111)
Creatinine, Ser: 4.12 mg/dL — ABNORMAL HIGH (ref 0.44–1.00)
GFR, Estimated: 13 mL/min — ABNORMAL LOW (ref >60)
Glucose, Bld: 93 mg/dL (ref 70–99)
Potassium: 5.3 mmol/L — ABNORMAL HIGH (ref 3.5–5.1)
Sodium: 135 mmol/L (ref 135–145)

## 2023-02-02 LAB — CBC
HCT: 29.3 % — ABNORMAL LOW (ref 36.0–46.0)
Hemoglobin: 8.7 g/dL — ABNORMAL LOW (ref 12.0–15.0)
MCH: 27 pg (ref 26.0–34.0)
MCHC: 29.7 g/dL — ABNORMAL LOW (ref 30.0–36.0)
MCV: 91 fL (ref 80.0–100.0)
Platelets: 272 10*3/uL (ref 150–400)
RBC: 3.22 MIL/uL — ABNORMAL LOW (ref 3.87–5.11)
RDW: 22.4 % — ABNORMAL HIGH (ref 11.5–15.5)
WBC: 4.1 10*3/uL (ref 4.0–10.5)
nRBC: 0 % (ref 0.0–0.2)

## 2023-02-02 LAB — GLUCOSE, CAPILLARY
Glucose-Capillary: 82 mg/dL (ref 70–99)
Glucose-Capillary: 171 mg/dL — ABNORMAL HIGH (ref 70–99)
Glucose-Capillary: 150 mg/dL — ABNORMAL HIGH (ref 70–99)
Glucose-Capillary: 147 mg/dL — ABNORMAL HIGH (ref 70–99)

## 2023-02-02 MED ORDER — SODIUM ZIRCONIUM CYCLOSILICATE 10 G PO PACK
10.0000 g | PACK | Freq: Every day | ORAL | Status: DC
  Administered 2023-02-02 – 2023-02-04 (×3): 10 g via ORAL
  Filled 2023-02-02 (×3): qty 1

## 2023-02-02 MED ORDER — SALINE SPRAY 0.65 % NA SOLN
1.0000 | NASAL | Status: DC | PRN
  Administered 2023-02-02: 1 via NASAL
  Filled 2023-02-02: qty 44

## 2023-02-02 MED ORDER — SODIUM BICARBONATE 650 MG PO TABS
1300.0000 mg | ORAL_TABLET | Freq: Two times a day (BID) | ORAL | Status: DC
  Administered 2023-02-02 – 2023-02-04 (×5): 1300 mg via ORAL
  Filled 2023-02-02 (×5): qty 2

## 2023-02-02 NOTE — Progress Notes (Signed)
Stroke Educational book given to patient this AM. Discussed the need to have family at bedside for additional education on signs/symptoms of stroke. Patient gave verbal understanding of this but needs follow up evaluation to ensure education was effective.

## 2023-02-02 NOTE — Progress Notes (Signed)
[facility] Progress Note  Subjective: pt w/ pmh intellectual disability, DM2, CKD, HTN and obesity presented w/ back pain. Moved to [place] recently to be w/ family. Lives w/ sister and a niece. CT abd w/o renal concern. Creat was 3.8  Vitals:   02/02/23 1018 02/02/23 1132 02/02/23 1200 02/02/23 1355  BP:   (!) 153/83   Pulse: 89 83 82   Resp: (!) 23 (!) 26 16   Temp:    97.7 F (36.5 C)  TempSrc:    Oral  SpO2: 90% 100% 98%   Weight:      Height:        Exam: General: well-appearing, no acute distress HEENT: anicteric sclera, oropharynx clear without lesions CV: normal rate, no rub Lungs: clear to auscultation bilaterally, normal work of breathing Abd: soft, non-tender, non-distended Skin: no visible lesions or rashes Psych: alert, engaged, appropriate mood and affect Musculoskeletal: no obvious deformities Neuro: at times speech is non-sensical or tangential, interactive, alert, responds to questioning     Date   Creat  eGFR    June- sept 2021 1.48- 2.06 33- 49 ml/min, CKD 3b    Mar - June 2024 2.90- 3.72 16- 21 ml/min from CE, CKD 4    8/12   3.93      8/13   3.84    8/14   3.59  15 ml/min    8/16   4.17  12 ml/min    02/02/23  4.12      UA dip/ micro 8/12 - -> glu 50, prot > 300, 0-5 rbc/ wbc/ epi     Urine prot total = >3,000 mg/dL     Urine creat 119 mg/dL     UP/C ratio =    > 30 gm/ 24 approx      CT abd --> Adrenals/Urinary Tract: Unremarkable adrenal glands. Nonspecific bilateral perinephric stranding. No urinary calculi or hydronephrosis. Unremarkable bladder.  Assessment/ Plan: AKI on Progressive CKD 4: b/l creat 2.9- 3.7 in early 2024, eGFR 16- 21 ml/min.  Admitted for back pain and admit creatinine 3.9. This likely represents progressive CKD 4 (approaching CKD 5).  CKD is most likely arterionephrosclerosis and diabetic kidney disease. Pt is not uremic, vol is stable as well. Pt states that she had a kidney doctor in [place] but is not planning  to return. She is agreeable to following up locally here in [place] with [facility]. Do not think she needs to remain in the hospital from renal standpoint. - we have already messaged our staff about setting up an outpatient visit  - no other suggestions, will sign off  Acute R MCA ischemic stroke: per pmd/ neurology Hypertension: Unlikely significant concern and likely at baseline.  Continue current medications with amlodipine, carvedilol, hydralazine, Imdur.  We will consider further titration of medications in the outpatient setting. Intellectual disability: Current mental status post to be at baseline.  Defer to primary, lives w/ family and recently moved to this area.  Anemia: decreased during hospital stay. Obtain iron studies. Could consider ESA outpatient Uncontrolled Diabetes Mellitus Type 2 with Hyperglycemia: mgmt per primary  Vinson Moselle MD  [facility] 02/02/2023, 2:05 PM  Recent Labs  Lab 02/01/23 0313 02/02/23 0821  HGB 8.9* 8.7*  CALCIUM 8.0* 8.6*  PHOS 4.4  --   CREATININE 4.17* 4.12*  K 5.1 5.3*   No results for input(s): "IRON", "TIBC", "FERRITIN" in the last 168 hours. Inpatient medications:  amLODipine  10 mg Oral Daily   aspirin  300 mg Rectal Daily   Or   aspirin  325 mg Oral Daily   atorvastatin  40 mg Oral Daily   carvedilol  6.25 mg Oral BID WC   Chlorhexidine Gluconate Cloth  6 each Topical QHS   clopidogrel  75 mg Oral Daily   heparin  5,000 Units Subcutaneous Q8H   hydrALAZINE  100 mg Oral Q8H   insulin aspart  0-6 Units Subcutaneous TID WC   isosorbide dinitrate  10 mg Oral TID   lidocaine  1 patch Transdermal Q24H   mupirocin ointment   Nasal BID   sodium bicarbonate  1,300 mg Oral BID   sodium zirconium cyclosilicate  10 g Oral Daily    acetaminophen, hydrALAZINE, labetalol, Muscle Rub, ondansetron **OR** ondansetron (ZOFRAN) IV, mouth rinse, oxyCODONE, sodium chloride, sorbitol

## 2023-02-02 NOTE — Plan of Care (Signed)
Plan of care and goals reviewed with patient, time given for questions and answers, patient handbook/guide at bedside.  Problem: Education: Goal: Ability to describe self-care measures that may prevent or decrease complications (Diabetes Survival Skills Education) will improve Outcome: Progressing Goal: Individualized Educational Video(s) Outcome: Progressing   Problem: Coping: Goal: Ability to adjust to condition or change in health will improve Outcome: Progressing   Problem: Fluid Volume: Goal: Ability to maintain a balanced intake and output will improve Outcome: Progressing   Problem: Health Behavior/Discharge Planning: Goal: Ability to identify and utilize available resources and services will improve Outcome: Progressing Goal: Ability to manage health-related needs will improve Outcome: Progressing   Problem: Metabolic: Goal: Ability to maintain appropriate glucose levels will improve Outcome: Progressing   Problem: Nutritional: Goal: Maintenance of adequate nutrition will improve Outcome: Progressing Goal: Progress toward achieving an optimal weight will improve Outcome: Progressing   Problem: Skin Integrity: Goal: Risk for impaired skin integrity will decrease Outcome: Progressing   Problem: Tissue Perfusion: Goal: Adequacy of tissue perfusion will improve Outcome: Progressing   Problem: Education: Goal: Knowledge of General Education information will improve Description: Including pain rating scale, medication(s)/side effects and non-pharmacologic comfort measures Outcome: Progressing   Problem: Health Behavior/Discharge Planning: Goal: Ability to manage health-related needs will improve Outcome: Progressing   Problem: Clinical Measurements: Goal: Ability to maintain clinical measurements within normal limits will improve Outcome: Progressing Goal: Will remain free from infection Outcome: Progressing Goal: Diagnostic test results will improve Outcome:  Progressing Goal: Respiratory complications will improve Outcome: Progressing Goal: Cardiovascular complication will be avoided Outcome: Progressing   Problem: Activity: Goal: Risk for activity intolerance will decrease Outcome: Progressing   Problem: Nutrition: Goal: Adequate nutrition will be maintained Outcome: Progressing   Problem: Coping: Goal: Level of anxiety will decrease Outcome: Progressing   Problem: Elimination: Goal: Will not experience complications related to bowel motility Outcome: Progressing Goal: Will not experience complications related to urinary retention Outcome: Progressing   Problem: Pain Managment: Goal: General experience of comfort will improve Outcome: Progressing   Problem: Safety: Goal: Ability to remain free from injury will improve Outcome: Progressing   Problem: Skin Integrity: Goal: Risk for impaired skin integrity will decrease Outcome: Progressing   Problem: Education: Goal: Knowledge of disease or condition will improve Outcome: Progressing Goal: Knowledge of secondary prevention will improve (MUST DOCUMENT ALL) Outcome: Progressing Goal: Knowledge of patient specific risk factors will improve Loraine Leriche N/A or DELETE if not current risk factor) Outcome: Progressing   Problem: Ischemic Stroke/TIA Tissue Perfusion: Goal: Complications of ischemic stroke/TIA will be minimized Outcome: Progressing   Problem: Coping: Goal: Will verbalize positive feelings about self Outcome: Progressing Goal: Will identify appropriate support needs Outcome: Progressing   Problem: Health Behavior/Discharge Planning: Goal: Ability to manage health-related needs will improve Outcome: Progressing Goal: Goals will be collaboratively established with patient/family Outcome: Progressing   Problem: Self-Care: Goal: Ability to participate in self-care as condition permits will improve Outcome: Progressing Goal: Verbalization of feelings and concerns  over difficulty with self-care will improve Outcome: Progressing Goal: Ability to communicate needs accurately will improve Outcome: Progressing   Problem: Nutrition: Goal: Risk of aspiration will decrease Outcome: Progressing Goal: Dietary intake will improve Outcome: Progressing

## 2023-02-02 NOTE — Progress Notes (Signed)
Patient's AM medications given. Shortly after, patient vomited several undigested pills up. This nurse will continue with current plan of care and patient stated she did not want anything for n/v as she is feeling better (requested orange juice and a sandwich). Patient educated on avoiding highly acidic drink and eating plain crackers until n/v is better controled.

## 2023-02-02 NOTE — Hospital Course (Addendum)
52 year old with intellectual disability, type 2 diabetes, CKD and recent status unknown, hypertension, obesity presented from home with 5 days of back pain.  Poor historian.  Patient reported  back pain started when she was reaching for her remote and twisted her back.  Patient just moved to [place] with her sister and niece. In the ED>CT scan abdomen pelvis was done without evidence of hydronephrosis or urinary retention, without evidence of aortic dissection.  Blood pressures were elevated.  Creatinine was 3.8, last known creatinine 1. 76 more than 3 years ago.  Admitted with AKI, back pain and medically stabilizing.Code stroke was called for patient's paraphrasing and confusion-did have small right lacunar stroke. Patient recovered from lacunar stroke but her renal functions are still not recovered.  Consulted nephrology 02/01/23.

## 2023-02-02 NOTE — Progress Notes (Signed)
PROGRESS NOTE Joanna [name]  QMV:[id number] DOB: 1970/08/12 DOA: 01/28/2023 PCP: Hillery Aldo, NP  Brief Narrative/Hospital Course: 52 year old with intellectual disability, type 2 diabetes, CKD and recent status unknown, hypertension, obesity presented from home with 5 days of back pain.  Poor historian.  Patient reported  back pain started when she was reaching for her remote and twisted her back.  Patient just moved to [place] with her sister and niece. In the ED>CT scan abdomen pelvis was done without evidence of hydronephrosis or urinary retention, without evidence of aortic dissection.  Blood pressures were elevated.  Creatinine was 3.8, last known creatinine 1. 76 more than 3 years ago.  Admitted with AKI, back pain and medically stabilizing.Code stroke was called for patient's paraphrasing and confusion-did have small right lacunar stroke. Patient recovered from lacunar stroke but her renal functions are still not recovered.  Consulted nephrology 02/01/23.    Subjective: Patient seen and examined this morning She is alert awake oriented communicative and interactive.  Overnight afebrile Labs shows potassium slightly trending up 5.3, creatinine about the same 4.1 BUN 40 chronic anemia  Assessment and Plan: Principal Problem:   Hypertensive urgency, malignant Active Problems:   Type 2 diabetes mellitus without complication (HCC)   AKI (acute kidney injury) (HCC)   Obesity, Class III, BMI 40-49.9 (morbid obesity) (HCC)   Intellectual disability   Back pain   Acute right MCA ischemic stroke: Episode of acute onset altered mental status, paraphrasing and confusion.  Difficult symptoms because of her underlying intellectual difficulties.Symptoms did not last more than 1 hour.>  Code stroke activated and underwent extensive workup CT head findings> no acute findings MRI of the brain> chronic microvascular changes along with very small acute infarct right internal capsule Carotid Doppler,  less than 39% stenosis bilateral carotids. 2D Echo-ejection fraction 50 to 55%, no source of thrombus.  Antiplatelet therapy, none at home. Neurology recommended aspirin Plavix for 3 weeks then aspirin alone LDL 153 on 6/28  STARTED Lipitor 40 mg daily. Hemoglobin A1c, 6.  Well-controlled.On Amaryl at home, stopped due to hypoglycemia.  Therapy recommendations, home health PT/OT   AKI on progressive CKD stage IV Hyperkalemia Mitral acidosis: Input appreciated from nephrology, likely representing progressive CKD but calling AKI as no baseline labs available. No evidence of hydronephrosis or urinary retention on her abdominal CT scan.Outpatient bed has been scheduled advised hypertensive management monitor renal functions intake output avoid hypotension, avoid nephrotoxic medication.  K still high 5.3 and bicarb 17. Will add Lokelma timing daily and sodium bicarb p.o. repeat BMP in a.m. Recent Labs    01/28/23 1436 01/29/23 0320 01/30/23 1303 02/01/23 0313 02/02/23 0821  BUN 43* 43* 36* 39* 40*  CREATININE 3.93* 3.84* 3.59* 4.17* 4.12*  CO2 20* 20* 17* 17* 17*    Hypertension urgency: BP trending up. Cont Amlodipine, Coreg hydralazine (dose WAS increased) iosrdil  Back pain: Probably musculoskeletal.  Symptomatic management.  Lidocaine patches.  Pain medications.  Improved.   Type 2 diabetes with hypoglycemia: On Amaryl at home.  Discontinued due to blood sugars less than 60.  Currently on sliding scale insulin.  A1c 6.  Will hold off on treatment. Recent Labs  Lab 01/29/23 0320 01/29/23 0728 02/01/23 0721 02/01/23 1112 02/01/23 1623 02/01/23 2036 02/02/23 0806  GLUCAP  --    < > 108* 135* 121* 116* 82  HGBA1C 6.0*  --   --   --   --   --   --    < > = values  in this interval not displayed.     Nocturnal hypoxemia: Probably due to obesity hypoventilation.  Overnight the study shows desaturations less than 89%.  She will be prescribed supplemental oxygen on discharge.  She  also qualifies for supplemental oxygen with mobility.   Intellectual disability: Patient to continue with her outpatient care  Class II Obesity:Patient's Body mass index is 39.76 kg/m. : Will benefit with PCP follow-up, weight loss  healthy lifestyle and outpatient sleep evaluation.   DVT prophylaxis: heparin injection 5,000 Units Start: 01/29/23 0600 Code Status:   Code Status: Full Code Family Communication: plan of care discussed with patient at bedside. Patient status is: Inpatient because of hyperkalemia AKI/CKD Level of care: Stepdown   Dispo: The patient is from: home with sister and nephew            Anticipated disposition: Home once clinically stable- over the weekend Objective: Vitals last 24 hrs: Vitals:   02/02/23 0600 02/02/23 0613 02/02/23 0802 02/02/23 0850  BP: 132/69 132/69 (!) 184/71   Pulse: 85  84   Resp: (!) 32  (!) 28   Temp:    97.7 F (36.5 C)  TempSrc:    Oral  SpO2: 100%  91%   Weight:  98.6 kg    Height:       Weight change:   Physical Examination: General exam: alert awake, older than stated age HEENT:Oral mucosa moist, Ear/Nose WNL grossly Respiratory system: bilaterally clear BS, no use of accessory muscle Cardiovascular system: S1 & S2 +, No JVD. Gastrointestinal system: Abdomen soft,NT,ND, BS+ Nervous System:Alert, awake, moving extremities. Extremities: LE edema neg,distal peripheral pulses palpable.  Skin: No rashes,no icterus. MSK: Normal muscle bulk,tone, power  Medications reviewed:  Scheduled Meds:  amLODipine  10 mg Oral Daily   aspirin  300 mg Rectal Daily   Or   aspirin  325 mg Oral Daily   atorvastatin  40 mg Oral Daily   carvedilol  6.25 mg Oral BID WC   Chlorhexidine Gluconate Cloth  6 each Topical QHS   clopidogrel  75 mg Oral Daily   heparin  5,000 Units Subcutaneous Q8H   hydrALAZINE  100 mg Oral Q8H   insulin aspart  0-6 Units Subcutaneous TID WC   isosorbide dinitrate  10 mg Oral TID   lidocaine  1 patch  Transdermal Q24H   mupirocin ointment   Nasal BID   sodium zirconium cyclosilicate  10 g Oral Daily   Continuous Infusions:    Diet Order             Diet Carb Modified Fluid consistency: Thin; Room service appropriate? Yes  Diet effective now                  Intake/Output Summary (Last 24 hours) at 02/02/2023 0947 Last data filed at 02/02/2023 0500 Gross per 24 hour  Intake 360 ml  Output --  Net 360 ml   Net IO Since Admission: 1,766.12 mL [02/02/23 0947]  Wt Readings from Last 3 Encounters:  02/02/23 98.6 kg  07/26/21 111.1 kg  01/18/20 (!) 123.8 kg     Unresulted Labs (From admission, onward)     Start     Ordered   02/03/23 0500  Basic metabolic panel  Tomorrow morning,   R       Question:  Specimen collection method  Answer:  Lab=Lab collect   02/02/23 0728   02/03/23 0500  CBC  Tomorrow morning,   R  Question:  Specimen collection method  Answer:  Lab=Lab collect   02/02/23 0728          Data Reviewed: I have personally reviewed following labs and imaging studies CBC: Recent Labs  Lab 01/28/23 1436 01/29/23 0320 02/01/23 0313 02/02/23 0821  WBC 3.9* 4.7 3.9* 4.1  NEUTROABS  --   --  2.8  --   HGB 10.7* 9.3* 8.9* 8.7*  HCT 34.4* 30.5* 29.1* 29.3*  MCV 86.2 89.2 89.5 91.0  PLT 286 258 261 272   Basic Metabolic Panel: Recent Labs  Lab 01/28/23 1436 01/29/23 0320 01/30/23 1303 02/01/23 0313 02/02/23 0821  NA 139 139 138 133* 135  K 4.2 5.0 4.2 5.1 5.3*  CL 108 109 113* 110 111  CO2 20* 20* 17* 17* 17*  GLUCOSE 89 119* 110* 99 93  BUN 43* 43* 36* 39* 40*  CREATININE 3.93* 3.84* 3.59* 4.17* 4.12*  CALCIUM 9.1 8.3* 7.1* 8.0* 8.6*  MG  --   --   --  1.9  --   PHOS  --   --   --  4.4  --   CBG: Recent Labs  Lab 02/01/23 0721 02/01/23 1112 02/01/23 1623 02/01/23 2036 02/02/23 0806  GLUCAP 108* 135* 121* 116* 82   Recent Results (from the past 240 hour(s))  MRSA Next Gen by PCR, Nasal     Status: Abnormal   Collection Time:  01/28/23 10:50 PM   Specimen: Nasal Mucosa; Nasal Swab  Result Value Ref Range Status   MRSA by PCR Next Gen DETECTED (A) NOT DETECTED Final    Comment: (NOTE) The GeneXpert MRSA Assay (FDA approved for NASAL specimens only), is one component of a comprehensive MRSA colonization surveillance program. It is not intended to diagnose MRSA infection nor to guide or monitor treatment for MRSA infections. Test performance is not FDA approved in patients less than 2 years old. Performed at [facility], [address], [place], [place] [address]     Antimicrobials: Anti-infectives (From admission, onward)    None      Culture/Microbiology No results found for: "SDES", "SPECREQUEST", "CULT", "REPTSTATUS"   Radiology Studies: No results found.   LOS: 5 days   Lanae Boast, MD [facility]  02/02/2023, 9:47 AM

## 2023-02-03 DIAGNOSIS — I160 Hypertensive urgency: Secondary | ICD-10-CM | POA: Diagnosis not present

## 2023-02-03 LAB — GLUCOSE, CAPILLARY
Glucose-Capillary: 105 mg/dL — ABNORMAL HIGH (ref 70–99)
Glucose-Capillary: 110 mg/dL — ABNORMAL HIGH (ref 70–99)
Glucose-Capillary: 141 mg/dL — ABNORMAL HIGH (ref 70–99)
Glucose-Capillary: 171 mg/dL — ABNORMAL HIGH (ref 70–99)

## 2023-02-03 LAB — BASIC METABOLIC PANEL
Anion gap: 9 (ref 5–15)
BUN: 40 mg/dL — ABNORMAL HIGH (ref 6–20)
CO2: 19 mmol/L — ABNORMAL LOW (ref 22–32)
Calcium: 8.7 mg/dL — ABNORMAL LOW (ref 8.9–10.3)
Chloride: 109 mmol/L (ref 98–111)
Creatinine, Ser: 4.23 mg/dL — ABNORMAL HIGH (ref 0.44–1.00)
GFR, Estimated: 12 mL/min — ABNORMAL LOW (ref >60)
Glucose, Bld: 143 mg/dL — ABNORMAL HIGH (ref 70–99)
Potassium: 5.3 mmol/L — ABNORMAL HIGH (ref 3.5–5.1)
Sodium: 137 mmol/L (ref 135–145)

## 2023-02-03 MED ORDER — CARVEDILOL 12.5 MG PO TABS
12.5000 mg | ORAL_TABLET | Freq: Two times a day (BID) | ORAL | Status: DC
  Administered 2023-02-03 – 2023-02-04 (×3): 12.5 mg via ORAL
  Filled 2023-02-03 (×3): qty 1

## 2023-02-03 NOTE — Plan of Care (Signed)
  Problem: Education: Goal: Ability to describe self-care measures that may prevent or decrease complications (Diabetes Survival Skills Education) will improve Outcome: Progressing Goal: Individualized Educational Video(s) Outcome: Progressing   Problem: Coping: Goal: Ability to adjust to condition or change in health will improve Outcome: Progressing   Problem: Fluid Volume: Goal: Ability to maintain a balanced intake and output will improve Outcome: Progressing   Problem: Health Behavior/Discharge Planning: Goal: Ability to identify and utilize available resources and services will improve Outcome: Progressing Goal: Ability to manage health-related needs will improve Outcome: Progressing   Problem: Metabolic: Goal: Ability to maintain appropriate glucose levels will improve Outcome: Progressing   Problem: Nutritional: Goal: Maintenance of adequate nutrition will improve Outcome: Progressing Goal: Progress toward achieving an optimal weight will improve Outcome: Progressing   Problem: Skin Integrity: Goal: Risk for impaired skin integrity will decrease Outcome: Progressing   Problem: Tissue Perfusion: Goal: Adequacy of tissue perfusion will improve Outcome: Progressing   Problem: Education: Goal: Knowledge of General Education information will improve Description: Including pain rating scale, medication(s)/side effects and non-pharmacologic comfort measures Outcome: Progressing   Problem: Health Behavior/Discharge Planning: Goal: Ability to manage health-related needs will improve Outcome: Progressing   Problem: Clinical Measurements: Goal: Ability to maintain clinical measurements within normal limits will improve Outcome: Progressing Goal: Will remain free from infection Outcome: Progressing Goal: Diagnostic test results will improve Outcome: Progressing Goal: Respiratory complications will improve Outcome: Progressing Goal: Cardiovascular complication will  be avoided Outcome: Progressing   Problem: Activity: Goal: Risk for activity intolerance will decrease Outcome: Progressing   Problem: Nutrition: Goal: Adequate nutrition will be maintained Outcome: Progressing   Problem: Coping: Goal: Level of anxiety will decrease Outcome: Progressing   Problem: Elimination: Goal: Will not experience complications related to bowel motility Outcome: Progressing Goal: Will not experience complications related to urinary retention Outcome: Progressing   Problem: Pain Managment: Goal: General experience of comfort will improve Outcome: Progressing   Problem: Safety: Goal: Ability to remain free from injury will improve Outcome: Progressing   Problem: Skin Integrity: Goal: Risk for impaired skin integrity will decrease Outcome: Progressing   Problem: Education: Goal: Knowledge of disease or condition will improve Outcome: Progressing Goal: Knowledge of secondary prevention will improve (MUST DOCUMENT ALL) Outcome: Progressing Goal: Knowledge of patient specific risk factors will improve (Mark N/A or DELETE if not current risk factor) Outcome: Progressing   Problem: Ischemic Stroke/TIA Tissue Perfusion: Goal: Complications of ischemic stroke/TIA will be minimized Outcome: Progressing   Problem: Coping: Goal: Will verbalize positive feelings about self Outcome: Progressing Goal: Will identify appropriate support needs Outcome: Progressing   Problem: Health Behavior/Discharge Planning: Goal: Ability to manage health-related needs will improve Outcome: Progressing Goal: Goals will be collaboratively established with patient/family Outcome: Progressing   Problem: Self-Care: Goal: Ability to participate in self-care as condition permits will improve Outcome: Progressing Goal: Verbalization of feelings and concerns over difficulty with self-care will improve Outcome: Progressing Goal: Ability to communicate needs accurately will  improve Outcome: Progressing   Problem: Nutrition: Goal: Risk of aspiration will decrease Outcome: Progressing Goal: Dietary intake will improve Outcome: Progressing   

## 2023-02-03 NOTE — Progress Notes (Signed)
Report called. Pt stable at time of transfer.

## 2023-02-03 NOTE — Progress Notes (Signed)
[facility] sent over patients NON-Formulary medication Atomoxetine, this nurse put it in patients bin in pyxis.

## 2023-02-03 NOTE — Progress Notes (Signed)
PROGRESS NOTE Joanna [name]  JXB:[id number] DOB: 03/03/1971 DOA: 01/28/2023 PCP: Hillery Aldo, NP  Brief Narrative/Hospital Course: 52 year old with intellectual disability, type 2 diabetes, CKD and recent status unknown, hypertension, obesity presented from home with 5 days of back pain.  Poor historian.  Patient reported  back pain started when she was reaching for her remote and twisted her back.  Patient just moved to [place] with her sister and niece. In the ED>CT scan abdomen pelvis was done without evidence of hydronephrosis or urinary retention, without evidence of aortic dissection.  Blood pressures were elevated.  Creatinine was 3.8, last known creatinine 1. 76 more than 3 years ago.  Admitted with AKI, back pain and medically stabilizing.Code stroke was called for patient's paraphrasing and confusion-did have small right lacunar stroke. Patient recovered from lacunar stroke but her renal functions are still not recovered.  Consulted nephrology 02/01/23.    Subjective: Patient seen and examined this morning She is resting comfortably.  Has no complaints Overnight afebrile BP in higher side K still up 5.3, creat ~ 4.2  Assessment and Plan: Principal Problem:   Hypertensive urgency, malignant Active Problems:   Type 2 diabetes mellitus without complication (HCC)   AKI (acute kidney injury) (HCC)   Obesity, Class III, BMI 40-49.9 (morbid obesity) (HCC)   Intellectual disability   Back pain   Acute right MCA ischemic stroke: Episode of acute onset altered mental status, paraphrasing and confusion.  Difficult symptoms because of her underlying intellectual difficulties.Symptoms did not last more than 1 hour.>  Code stroke activated and underwent extensive workup CT head findings> no acute findings MRI of the brain> chronic microvascular changes along with very small acute infarct right internal capsule Carotid Doppler, less than 39% stenosis bilateral carotids. 2D Echo-ejection  fraction 50 to 55%, no source of thrombus.  LDL 153 on 6/28  STARTED Lipitor 40 mg daily. Hemoglobin A1c, 6.  Well-controlled.On Amaryl at home, stopped due to hypoglycemia.  Neurology recommended aspirin Plavix for 3 weeks then aspirin alone.Antiplatelet therapy, none at home. Therapy recommendations, home health PT/OT   AKI on progressive CKD stage IV Hyperkalemia Mitral acidosis: Input appreciated from nephrology, likely representing progressive CKD but calling AKI as no baseline labs available. It is progressive ckd ( approaching ckd5)No evidence of hydronephrosis or urinary retention on her abdominal CT scan.Outpatient bed has been scheduled advised hypertensive management monitor renal functions intake output avoid hypotension, avoid nephrotoxic medication.  K still high 5.3, hco3 increasing- cont [place], low k diet cont bicarb and ensure potassium is stable before d/c Recent Labs    01/28/23 1436 01/29/23 0320 01/30/23 1303 02/01/23 0313 02/02/23 0821 02/03/23 0258  BUN 43* 43* 36* 39* 40* 40*  CREATININE 3.93* 3.84* 3.59* 4.17* 4.12* 4.23*  CO2 20* 20* 17* 17* 17* 19*    Hypertension urgency: BP remains poorly controlled.Increase Coreg to 12.5 mg bid. Continue on Amlodipine, Hydralazine (dose WAS increased) iosrdil  Back pain: Probably musculoskeletal.  Symptomatic management.  Lidocaine patches.  Pain medications.  Improved.   Type 2 diabetes with hypoglycemia: Home Amaryl has been discontinued due to hypoglycemia.  Continue SSI.HbA1c stable at 6.0.  Recent Labs  Lab 01/29/23 0320 01/29/23 0728 02/02/23 0806 02/02/23 1133 02/02/23 1623 02/02/23 2116 02/03/23 0758  GLUCAP  --    < > 82 171* 150* 147* 105*  HGBA1C 6.0*  --   --   --   --   --   --    < > = values in this interval  not displayed.     Nocturnal hypoxemia: Probably due to obesity hypoventilation.  Overnight the study shows desaturations less than 89%.  She will be prescribed supplemental oxygen on  discharge.  She also qualifies for supplemental oxygen with mobility.   Intellectual disability: Patient to continue with her outpatient care  Class II Obesity:Patient's Body mass index is 41.01 kg/m. : Will benefit with PCP follow-up, weight loss  healthy lifestyle and outpatient sleep evaluation.   DVT prophylaxis: heparin injection 5,000 Units Start: 01/29/23 0600 Code Status:   Code Status: Full Code Family Communication: plan of care discussed with patient at bedside. Patient status is: Inpatient because of hyperkalemia AKI/CKD Level of care: Stepdown > okay to transfer to telemetry  Dispo: The patient is from: home with sister and nephew            Anticipated disposition: Home likely tomorrow if potassium stable   Objective: Vitals last 24 hrs: Vitals:   02/03/23 0609 02/03/23 0610 02/03/23 0611 02/03/23 0612  BP:      Pulse: 88 77 85 80  Resp: 19 18 (!) 23 20  Temp:      TempSrc:      SpO2: 97% 99% 99% 97%  Weight:      Height:       Weight change: 3.1 kg  Physical Examination: General exam: AA,, oriented appears weak and older than stated HEENT:Oral mucosa moist, Ear/Nose WNL grossly, dentition normal. Respiratory system: bilaterally clear BS, no use of accessory muscle Cardiovascular system: S1 & S2 +, regular rate. Gastrointestinal system: Abdomen soft, NT,ND,BS+ Nervous System:Alert, awake, moving extremities and grossly nonfocal Extremities: LE ankle edema +, lower extremities warm Skin: No rashes,no icterus. MSK: Normal muscle bulk,tone, power   Medications reviewed:  Scheduled Meds:  amLODipine  10 mg Oral Daily   aspirin  300 mg Rectal Daily   Or   aspirin  325 mg Oral Daily   atorvastatin  40 mg Oral Daily   carvedilol  12.5 mg Oral BID WC   Chlorhexidine Gluconate Cloth  6 each Topical QHS   clopidogrel  75 mg Oral Daily   heparin  5,000 Units Subcutaneous Q8H   hydrALAZINE  100 mg Oral Q8H   insulin aspart  0-6 Units Subcutaneous TID WC    isosorbide dinitrate  10 mg Oral TID   lidocaine  1 patch Transdermal Q24H   mupirocin ointment   Nasal BID   sodium bicarbonate  1,300 mg Oral BID   sodium zirconium cyclosilicate  10 g Oral Daily   Continuous Infusions:    Diet Order             Diet renal/carb modified with fluid restriction Diet-HS Snack? Nothing; Fluid restriction: 1200 mL Fluid; Room service appropriate? Yes; Fluid consistency: Thin  Diet effective now                  Intake/Output Summary (Last 24 hours) at 02/03/2023 0804 Last data filed at 02/03/2023 0200 Gross per 24 hour  Intake 720 ml  Output 200 ml  Net 520 ml   Net IO Since Admission: 2,286.12 mL [02/03/23 0804]  Wt Readings from Last 3 Encounters:  02/03/23 101.7 kg  07/26/21 111.1 kg  01/18/20 (!) 123.8 kg     Unresulted Labs (From admission, onward)    None     Data Reviewed: I have personally reviewed following labs and imaging studies CBC: Recent Labs  Lab 01/28/23 1436 01/29/23 0320 02/01/23 9811 02/02/23 9147  WBC 3.9* 4.7 3.9* 4.1  NEUTROABS  --   --  2.8  --   HGB 10.7* 9.3* 8.9* 8.7*  HCT 34.4* 30.5* 29.1* 29.3*  MCV 86.2 89.2 89.5 91.0  PLT 286 258 261 272   Basic Metabolic Panel: Recent Labs  Lab 01/29/23 0320 01/30/23 1303 02/01/23 0313 02/02/23 0821 02/03/23 0258  NA 139 138 133* 135 137  K 5.0 4.2 5.1 5.3* 5.3*  CL 109 113* 110 111 109  CO2 20* 17* 17* 17* 19*  GLUCOSE 119* 110* 99 93 143*  BUN 43* 36* 39* 40* 40*  CREATININE 3.84* 3.59* 4.17* 4.12* 4.23*  CALCIUM 8.3* 7.1* 8.0* 8.6* 8.7*  MG  --   --  1.9  --   --   PHOS  --   --  4.4  --   --   CBG: Recent Labs  Lab 02/02/23 0806 02/02/23 1133 02/02/23 1623 02/02/23 2116 02/03/23 0758  GLUCAP 82 171* 150* 147* 105*   Recent Results (from the past 240 hour(s))  MRSA Next Gen by PCR, Nasal     Status: Abnormal   Collection Time: 01/28/23 10:50 PM   Specimen: Nasal Mucosa; Nasal Swab  Result Value Ref Range Status   MRSA by PCR Next Gen  DETECTED (A) NOT DETECTED Final    Comment: (NOTE) The GeneXpert MRSA Assay (FDA approved for NASAL specimens only), is one component of a comprehensive MRSA colonization surveillance program. It is not intended to diagnose MRSA infection nor to guide or monitor treatment for MRSA infections. Test performance is not FDA approved in patients less than 77 years old. Performed at [facility], [address] [place], [place] [address]     Antimicrobials: Anti-infectives (From admission, onward)    None      Culture/Microbiology No results found for: "SDES", "SPECREQUEST", "CULT", "REPTSTATUS"   Radiology Studies: No results found.   LOS: 6 days   Lanae Boast, MD [facility]  02/03/2023, 8:04 AM

## 2023-02-04 ENCOUNTER — Other Ambulatory Visit (HOSPITAL_COMMUNITY): Payer: Self-pay

## 2023-02-04 DIAGNOSIS — I160 Hypertensive urgency: Secondary | ICD-10-CM | POA: Diagnosis not present

## 2023-02-04 LAB — BASIC METABOLIC PANEL
Anion gap: 6 (ref 5–15)
BUN: 39 mg/dL — ABNORMAL HIGH (ref 6–20)
CO2: 21 mmol/L — ABNORMAL LOW (ref 22–32)
Calcium: 8.9 mg/dL (ref 8.9–10.3)
Chloride: 110 mmol/L (ref 98–111)
Creatinine, Ser: 4.05 mg/dL — ABNORMAL HIGH (ref 0.44–1.00)
GFR, Estimated: 13 mL/min — ABNORMAL LOW (ref >60)
Glucose, Bld: 142 mg/dL — ABNORMAL HIGH (ref 70–99)
Potassium: 5.3 mmol/L — ABNORMAL HIGH (ref 3.5–5.1)
Sodium: 137 mmol/L (ref 135–145)

## 2023-02-04 LAB — GLUCOSE, CAPILLARY
Glucose-Capillary: 160 mg/dL — ABNORMAL HIGH (ref 70–99)
Glucose-Capillary: 138 mg/dL — ABNORMAL HIGH (ref 70–99)

## 2023-02-04 MED ORDER — CLOPIDOGREL BISULFATE 75 MG PO TABS
75.0000 mg | ORAL_TABLET | Freq: Every day | ORAL | 0 refills | Status: AC
  Filled 2023-02-04: qty 16, 16d supply, fill #0

## 2023-02-04 MED ORDER — AMLODIPINE BESYLATE 10 MG PO TABS
10.0000 mg | ORAL_TABLET | Freq: Every day | ORAL | 0 refills | Status: AC
  Filled 2023-02-04: qty 30, 30d supply, fill #0

## 2023-02-04 MED ORDER — ATORVASTATIN CALCIUM 40 MG PO TABS
40.0000 mg | ORAL_TABLET | Freq: Every day | ORAL | 0 refills | Status: AC
  Filled 2023-02-04: qty 30, 30d supply, fill #0

## 2023-02-04 MED ORDER — CARVEDILOL 12.5 MG PO TABS
12.5000 mg | ORAL_TABLET | Freq: Two times a day (BID) | ORAL | 0 refills | Status: AC
  Filled 2023-02-04: qty 60, 30d supply, fill #0

## 2023-02-04 MED ORDER — SODIUM ZIRCONIUM CYCLOSILICATE 10 G PO PACK
10.0000 g | PACK | Freq: Every day | ORAL | 0 refills | Status: AC
Start: 2023-02-05 — End: 2023-03-07
  Filled 2023-02-04: qty 30, 30d supply, fill #0

## 2023-02-04 MED ORDER — SODIUM BICARBONATE 650 MG PO TABS
1300.0000 mg | ORAL_TABLET | Freq: Two times a day (BID) | ORAL | 0 refills | Status: AC
Start: 2023-02-04 — End: 2023-03-06
  Filled 2023-02-04: qty 120, 30d supply, fill #0

## 2023-02-04 MED ORDER — HYDRALAZINE HCL 100 MG PO TABS
100.0000 mg | ORAL_TABLET | Freq: Three times a day (TID) | ORAL | 0 refills | Status: DC
  Filled 2023-02-04: qty 30, 10d supply, fill #0

## 2023-02-04 NOTE — Progress Notes (Signed)
SATURATION QUALIFICATIONS: (This note is used to comply with regulatory documentation for home oxygen)  Patient Saturations on Room Air at Rest = 85%  Patient Saturations on Room Air while Ambulating = 83%  Patient Saturations on 3 Liters of oxygen while Ambulating = 90%  Please briefly explain why patient needs home oxygen:Pt requires 2 L supplemental oxygen during rest and 3 L during activity to achieve therapeutic level.

## 2023-02-04 NOTE — Discharge Summary (Signed)
Physician Discharge Summary  Joanna [name] VWU:[id number] DOB: 29-Oct-1970 DOA: 01/28/2023  PCP: Hillery Aldo, NP  Admit date: 01/28/2023 Discharge date: 02/04/2023  Admitted From: Home Disposition: Home with home health  Recommendations for Outpatient Follow-up:  Follow up with PCP in 1-2 weeks Please obtain BMP/CBC in one week Nephrology to schedule follow-up  Home Health: PT/OT Equipment/Devices: Portable oxygen  Discharge Condition: Fair CODE STATUS: Full code Diet recommendation: Low-salt and low-carb diet  Discharge summary: 52 year old with intellectual disability, type 2 diabetes, CKD and recent status unknown, hypertension, obesity presented from home with 5 days of back pain.  Poor historian.  Patient reported  back pain started when she was reaching for her remote and twisted her back.  Patient just moved to [place] with her sister and niece. In the ED>CT scan abdomen pelvis was done without evidence of hydronephrosis or urinary retention, without evidence of aortic dissection.  Blood pressures were elevated.  Creatinine was 3.8, last known creatinine 1. 76 more than 3 years ago.  Admitted with AKI, back pain and medically stabilizing.Code stroke was called for patient's paraphrasing and confusion-did have small right lacunar stroke. Patient recovered from lacunar stroke but her renal functions were not recovering.  Patient stayed in the hospital due to her renal functions.  Also followed by nephrology.  Assessment and Plan: Principal Problem:   Hypertensive urgency, malignant Active Problems:   Type 2 diabetes mellitus without complication (HCC)   AKI (acute kidney injury) (HCC)   Obesity, Class III, BMI 40-49.9 (morbid obesity) (HCC)   Intellectual disability   Back pain   Acute right MCA ischemic stroke: Episode of acute onset altered mental status, paraphrasing and confusion.  Difficult symptoms because of her underlying intellectual difficulties.Symptoms did not last  more than 1 hour.>  Code stroke activated and underwent extensive workup CT head findings> no acute findings MRI of the brain> chronic microvascular changes along with very small acute infarct right internal capsule Carotid Doppler, less than 39% stenosis bilateral carotids. 2D Echo-ejection fraction 50 to 55%, no source of thrombus.  LDL 153 on 6/28  STARTED Lipitor 40 mg daily. Hemoglobin A1c, 6.  Well-controlled.On Amaryl at home, stopped due to hypoglycemia.  Neurology recommended aspirin Plavix for 3 weeks then aspirin alone. Therapy recommendations, home health PT/OT   AKI on progressive CKD stage IV Hyperkalemia Metabolic acidosis: Input appreciated from nephrology, likely representing progressive CKD but calling AKI as no baseline labs available. It is progressive ckd ( approaching ckd5)No evidence of hydronephrosis or urinary retention on her abdominal CT scan.Outpatient appointment has been scheduled by nephrology.   K still high 5.3, hco3 increasing- cont l[place]el[place], low k diet cont bicarb.   Hypertension urgency: BP remains poorly controlled.Increase Coreg to 12.5 mg bid. Continue on Amlodipine, Hydralazine (dose WAS increased) , nitrate.   Back pain: Improved.   Type 2 diabetes with hypoglycemia: Home Amaryl has been discontinued due to hypoglycemia.  High risk of hypoglycemia.  Discontinue all oral hypoglycemic agents.  HbA1c stable at 6.0.     Nocturnal hypoxemia: Probably due to obesity hypoventilation.  Overnight the study shows desaturations less than 89%.  She will be prescribed supplemental oxygen on discharge.  She also qualifies for supplemental oxygen with mobility.   Intellectual disability: Patient to continue with her outpatient care   Class II Obesity:Patient's Body mass index is 41.01 kg/m. : Will benefit with PCP follow-up, weight loss  healthy lifestyle and outpatient sleep evaluation.  Patient is medically stable.  She is able to  go home today with her  family.   Discharge Diagnoses:  Principal Problem:   Hypertensive urgency, malignant Active Problems:   Type 2 diabetes mellitus without complication (HCC)   AKI (acute kidney injury) (HCC)   Obesity, Class III, BMI 40-49.9 (morbid obesity) (HCC)   Intellectual disability   Back pain    Discharge Instructions  Discharge Instructions     Diet - low sodium heart healthy   Complete by: As directed    Diet Carb Modified   Complete by: As directed    Increase activity slowly   Complete by: As directed       Allergies as of 02/04/2023   No Known Allergies      Medication List     STOP taking these medications    furosemide 20 MG tablet Commonly known as: LASIX   glimepiride 2 MG tablet Commonly known as: AMARYL   metformin 500 MG (OSM) 24 hr tablet Commonly known as: FORTAMET   rosuvastatin 10 MG tablet Commonly known as: CRESTOR       TAKE these medications    amLODipine 10 MG tablet Commonly known as: NORVASC Take 1 tablet (10 mg total) by mouth daily. Start taking on: February 05, 2023 What changed:  medication strength how much to take   aspirin EC 81 MG tablet Take 1 tablet (81 mg total) by mouth daily. Swallow whole.   atorvastatin 40 MG tablet Commonly known as: LIPITOR Take 1 tablet (40 mg total) by mouth daily. Start taking on: February 05, 2023   carvedilol 12.5 MG tablet Commonly known as: COREG Take 1 tablet (12.5 mg total) by mouth 2 (two) times daily with a meal. What changed:  medication strength how much to take   clopidogrel 75 MG tablet Commonly known as: PLAVIX Take 1 tablet (75 mg total) by mouth daily for 16 days. Start taking on: February 05, 2023   hydrALAZINE 100 MG tablet Commonly known as: APRESOLINE Take 1 tablet (100 mg total) by mouth every 8 (eight) hours. What changed:  medication strength how much to take   isosorbide mononitrate 120 MG 24 hr tablet Commonly known as: IMDUR Take 1 tablet (120 mg total) by  mouth daily.   sodium bicarbonate 650 MG tablet Take 2 tablets (1,300 mg total) by mouth 2 (two) times daily.   sodium zirconium cyclosilicate 10 g Pack packet Commonly known as: LOKELMA Take 10 g by mouth daily. Start taking on: February 05, 2023        No Known Allergies  Consultations: Nephrology   Procedures/Studies: ECHOCARDIOGRAM COMPLETE BUBBLE STUDY  Result Date: 01/31/2023    ECHOCARDIOGRAM REPORT   Patient Name:   Anel Narine  Date of Exam: 01/31/2023 Medical Rec #:  [id number]  Height:       62.0 in Accession #:    [id number] Weight:       200.4 lb Date of Birth:  1970/07/10  BSA:          1.913 m Patient Age:    52 years   BP:           160/93 mmHg Patient Gender: F          HR:           80 bpm. Exam Location:  Inpatient Procedure: 2D Echo, Cardiac Doppler, Color Doppler, Strain Analysis and Saline            Contrast Bubble Study Indications:    Stroke  History:  Patient has prior history of Echocardiogram examinations, most                 recent 12/17/2019. CHF, Stroke, Arrythmias:Ventricular                 Fibrillation, Signs/Symptoms:Altered Mental Status; Risk                 Factors:Diabetes and Hypertension. Pericardial effusion.  Sonographer:    Sheralyn Boatman RDCS Referring Phys: [id number] [facility]  Sonographer Comments: Image acquisition challenging due to patient body habitus. IMPRESSIONS  1. Left ventricular ejection fraction, by estimation, is 50 to 55%. The left ventricle has low normal function. The left ventricle has no regional wall motion abnormalities. There is moderate left ventricular hypertrophy. Left ventricular diastolic parameters are consistent with Grade III diastolic dysfunction (restrictive).  2. Right ventricular systolic function is moderately reduced. The right ventricular size is moderately enlarged. Moderately increased right ventricular wall thickness. There is moderately elevated pulmonary artery systolic pressure. The estimated right ventricular  systolic pressure is 48.2 mmHg.  3. Left atrial size was severely dilated.  4. Right atrial size was moderately dilated.  5. A small pericardial effusion is present. The pericardial effusion is circumferential. There is no evidence of cardiac tamponade.  6. The mitral valve is degenerative. Mild mitral valve regurgitation. No evidence of mitral stenosis.  7. The aortic valve is tricuspid. Aortic valve regurgitation is trivial. No aortic stenosis is present.  8. The pulmonic valve was doming.  9. The inferior vena cava is dilated in size with <50% respiratory variability, suggesting right atrial pressure of 15 mmHg. 10. Agitated saline contrast bubble study was positive with shunting observed within 3-6 cardiac cycles suggestive of interatrial shunt. Conclusion(s)/Recommendation(s): There is significan biventricular hypertrophy with mild to moderate biventricualr dysfunction and a small pericardial effusion. Would recommend further assessment to evalaute for infiltrative cardiomyopathy (i.e. amyloid). Bubble study is very weakl positive. FINDINGS  Left Ventricle: Left ventricular ejection fraction, by estimation, is 50 to 55%. The left ventricle has low normal function. The left ventricle has no regional wall motion abnormalities. The left ventricular internal cavity size was normal in size. There is moderate left ventricular hypertrophy. Left ventricular diastolic parameters are consistent with Grade III diastolic dysfunction (restrictive). Right Ventricle: The right ventricular size is moderately enlarged. Moderately increased right ventricular wall thickness. Right ventricular systolic function is moderately reduced. There is moderately elevated pulmonary artery systolic pressure. The tricuspid regurgitant velocity is 2.88 m/s, and with an assumed right atrial pressure of 15 mmHg, the estimated right ventricular systolic pressure is 48.2 mmHg. Left Atrium: Left atrial size was severely dilated. Right Atrium: Right  atrial size was moderately dilated. Pericardium: A small pericardial effusion is present. The pericardial effusion is circumferential. There is no evidence of cardiac tamponade. Mitral Valve: The mitral valve is degenerative in appearance. Mild mitral valve regurgitation. No evidence of mitral valve stenosis. MV peak gradient, 7.1 mmHg. The mean mitral valve gradient is 3.0 mmHg. Tricuspid Valve: The tricuspid valve is grossly normal. Tricuspid valve regurgitation is mild . No evidence of tricuspid stenosis. Aortic Valve: The aortic valve is tricuspid. Aortic valve regurgitation is trivial. No aortic stenosis is present. Pulmonic Valve: The pulmonic valve was doming in systole. Pulmonic valve regurgitation is mild to moderate. No evidence of pulmonic stenosis. Aorta: The aortic root and ascending aorta are structurally normal, with no evidence of dilitation. Venous: The inferior vena cava is dilated in size with less than 50% respiratory  variability, suggesting right atrial pressure of 15 mmHg. IAS/Shunts: There is right bowing of the interatrial septum, suggestive of elevated left atrial pressure. No atrial level shunt detected by color flow Doppler. Agitated saline contrast was given intravenously to evaluate for intracardiac shunting. Agitated saline contrast bubble study was positive with shunting observed within 3-6 cardiac cycles suggestive of interatrial shunt.  LEFT VENTRICLE PLAX 2D LVIDd:         4.70 cm      Diastology LVIDs:         3.35 cm      LV e' medial:    4.65 cm/s LV PW:         1.60 cm      LV E/e' medial:  25.4 LV IVS:        1.45 cm      LV e' lateral:   4.65 cm/s LVOT diam:     2.00 cm      LV E/e' lateral: 25.4 LV SV:         71 LV SV Index:   37 LVOT Area:     3.14 cm  LV Volumes (MOD) LV vol d, MOD A2C: 115.0 ml LV vol d, MOD A4C: 90.6 ml LV vol s, MOD A2C: 54.7 ml LV vol s, MOD A4C: 43.6 ml LV SV MOD A2C:     60.3 ml LV SV MOD A4C:     90.6 ml LV SV MOD BP:      55.1 ml RIGHT VENTRICLE             IVC RV S prime:     9.68 cm/s  IVC diam: 2.40 cm TAPSE (M-mode): 1.9 cm LEFT ATRIUM             Index        RIGHT ATRIUM           Index LA diam:        5.10 cm 2.67 cm/m   RA Area:     19.70 cm LA Vol (A2C):   84.0 ml 43.90 ml/m  RA Volume:   53.80 ml  28.12 ml/m LA Vol (A4C):   62.7 ml 32.77 ml/m LA Biplane Vol: 75.9 ml 39.67 ml/m  AORTIC VALVE             PULMONIC VALVE LVOT Vmax:   126.00 cm/s PR End Diast Vel: 2.76 msec LVOT Vmean:  82.700 cm/s LVOT VTI:    0.226 m  AORTA Ao Root diam: 2.90 cm Ao Asc diam:  3.50 cm MITRAL VALVE                TRICUSPID VALVE MV Area (PHT): 5.27 cm     TR Peak grad:   33.2 mmHg MV Area VTI:   2.35 cm     TR Vmax:        288.00 cm/s MV Peak grad:  7.1 mmHg MV Mean grad:  3.0 mmHg     SHUNTS MV Vmax:       1.33 m/s     Systemic VTI:  0.23 m MV Vmean:      77.7 cm/s    Systemic Diam: 2.00 cm MV Decel Time: 144 msec MV E velocity: 118.00 cm/s MV A velocity: 56.90 cm/s MV E/A ratio:  2.07 Arvilla Meres MD Electronically signed by Arvilla Meres MD Signature Date/Time: 01/31/2023/12:00:46 PM    Final    MR ANGIO HEAD WO CONTRAST  Result Date: 01/30/2023 CLINICAL DATA:  Stroke follow-up.  Confusion and weakness. EXAM: MRA HEAD WITHOUT CONTRAST TECHNIQUE: Angiographic images of the Circle of Willis were acquired using MRA technique without intravenous contrast. COMPARISON:  Brain MRI from earlier today FINDINGS: Anterior circulation: Moderate narrowing at a right M2 origin. No beading or aneurysm. No vascular malformation. Posterior circulation: Mild left vertebral artery dominance. The vertebral artery on the right is mildly irregular which is likely atheromatous. No flow reducing stenosis in the vertebral or basilar arteries. No branch occlusion, beading, or aneurysm. Mild branch vessel atheromatous irregularity. IMPRESSION: 1. No emergent arterial finding. 2. Intracranial atherosclerosis most notably causing a moderate right M2 origin stenosis.  Electronically Signed   By: Tiburcio Pea M.D.   On: 01/30/2023 16:49   VAS US CAROTID (at [facility] and [facility] only)  Result Date: 01/30/2023 Carotid Arterial Duplex Study Patient Name:  Joanna [name]  Date of Exam:   01/30/2023 Medical Rec #: [id number]  Accession #:    [id number] Date of Birth: February 24, 1971  Patient Gender: F Patient Age:   15 years Exam Location:  [facility] Procedure:      VAS US CAROTID Referring Phys: Dorcas Carrow --------------------------------------------------------------------------------  Indications:       Carotid stenosis-bilateral I65.23. Risk Factors:      Hypertension, Diabetes. Limitations        Today's exam was limited due to the body habitus of the                    patient, the patient's respiratory variation and patient                    somnolence, patient movement, patient positioning. Comparison Study:  No prior studies. Performing Technologist: Chanda Busing RVT  Examination Guidelines: A complete evaluation includes B-mode imaging, spectral Doppler, color Doppler, and power Doppler as needed of all accessible portions of each vessel. Bilateral testing is considered an integral part of a complete examination. Limited examinations for reoccurring indications may be performed as noted.  Right Carotid Findings: +----------+--------+--------+--------+-----------------------+--------+           PSV cm/sEDV cm/sStenosisPlaque Description     Comments +----------+--------+--------+--------+-----------------------+--------+ CCA Prox  116     21              smooth and heterogenous         +----------+--------+--------+--------+-----------------------+--------+ CCA Distal69      19              smooth and heterogenous         +----------+--------+--------+--------+-----------------------+--------+ ICA Prox  78      39              smooth and heterogenous         +----------+--------+--------+--------+-----------------------+--------+ ICA Mid   91       41              smooth and heterogenous         +----------+--------+--------+--------+-----------------------+--------+ ICA Distal62      36                                     tortuous +----------+--------+--------+--------+-----------------------+--------+ ECA       80      14                                              +----------+--------+--------+--------+-----------------------+--------+ +----------+--------+-------+--------+-------------------+  PSV cm/sEDV cmsDescribeArm Pressure (mmHG) +----------+--------+-------+--------+-------------------+ [id number]                                        +----------+--------+-------+--------+-------------------+ +---------+--------+--+--------+--+---------+ VertebralPSV cm/s39EDV cm/s10Antegrade +---------+--------+--+--------+--+---------+  Left Carotid Findings: +----------+--------+--------+--------+-----------------------+--------+           PSV cm/sEDV cm/sStenosisPlaque Description     Comments +----------+--------+--------+--------+-----------------------+--------+ CCA Prox  145     28              smooth and heterogenous         +----------+--------+--------+--------+-----------------------+--------+ CCA Mid   149     38                                              +----------+--------+--------+--------+-----------------------+--------+ CCA Distal85      17              smooth and heterogenous         +----------+--------+--------+--------+-----------------------+--------+ ICA Prox  86      27              smooth and heterogenous         +----------+--------+--------+--------+-----------------------+--------+ ICA Mid                                                  tortuous +----------+--------+--------+--------+-----------------------+--------+ ICA Distal91      49                                     tortuous  +----------+--------+--------+--------+-----------------------+--------+ ECA       126     14                                              +----------+--------+--------+--------+-----------------------+--------+ +----------+--------+--------+--------+-------------------+           PSV cm/sEDV cm/sDescribeArm Pressure (mmHG) +----------+--------+--------+--------+-------------------+ [id number]                                         +----------+--------+--------+--------+-------------------+ +---------+--------+--+--------+--+---------+ VertebralPSV cm/s89EDV cm/s36Antegrade +---------+--------+--+--------+--+---------+   Summary: Right Carotid: Velocities in the right ICA are consistent with a 1-39% stenosis. Left Carotid: Velocities in the left ICA are consistent with a 1-39% stenosis. Vertebrals: Bilateral vertebral arteries demonstrate antegrade flow. *See table(s) above for measurements and observations.  Electronically signed by Delia Heady MD on 01/30/2023 at 3:12:12 PM.    Final    MR BRAIN WO CONTRAST  Result Date: 01/30/2023 CLINICAL DATA:  Sudden onset altered mental status repeat in the same for age. EXAM: MRI HEAD WITHOUT CONTRAST TECHNIQUE: Multiplanar, multiecho pulse sequences of the brain and surrounding structures were obtained without intravenous contrast. COMPARISON:  Same-day CT head FINDINGS: Brain: There is a punctate acute infarct in the anterior limb of the right internal capsule without associated FLAIR signal abnormality. There is no hemorrhage or mass effect. There is no  other evidence of acute infarct. There is no acute intracranial hemorrhage. Background parenchymal volume is normal. The ventricles are normal in size. There is extensive FLAIR signal abnormality throughout the supratentorial white matter which is favored to reflect significantly age accelerated chronic small-vessel ischemic change. There are numerous punctate chronic microhemorrhages  which are primarily centrally distributed, likely hypertensive in etiology. There are small remote infarcts in the left thalamus, pons, and bilateral corona radiata. The pituitary and suprasellar region are normal. There is no mass lesion. There is no mass effect or midline shift. Vascular: Normal flow voids. Skull and upper cervical spine: Normal marrow signal. Sinuses/Orbits: There is mild mucosal thickening in the paranasal sinuses. A right lens implant is noted. The globes and orbits are otherwise unremarkable. Other: The mastoid air cells and middle ear cavities are clear. IMPRESSION: 1. Punctate acute infarct in the anterior limb of the right internal capsule. 2. Significantly age accelerated underlying chronic small-vessel ischemic change, and numerous punctate chronic microhemorrhages which are likely hypertensive in etiology. 3. Small remote infarcts in the left thalamus, pons, and bilateral corona radiata. Electronically Signed   By: Lesia Hausen M.D.   On: 01/30/2023 12:27   CT HEAD CODE STROKE WO CONTRAST`  Result Date: 01/30/2023 CLINICAL DATA:  Expressive aphasia EXAM: CT HEAD WITHOUT CONTRAST TECHNIQUE: Contiguous axial images were obtained from the base of the skull through the vertex without intravenous contrast. RADIATION DOSE REDUCTION: This exam was performed according to the departmental dose-optimization program which includes automated exposure control, adjustment of the mA and/or kV according to patient size and/or use of iterative reconstruction technique. COMPARISON:  None Available. FINDINGS: Brain: There is no acute intracranial hemorrhage, extra-axial fluid collection, or acute territorial infarct. Aspects is 10. There is a possible age-indeterminate infarct in the anterior limb of the left internal capsule (2-15). Parenchymal volume is normal. The ventricles are normal in size. Gray-white differentiation is preserved. There is extensive hypodensity in the supratentorial white  matter, greater than expected for age. The pituitary and suprasellar region are normal. There is no mass lesion. There is no mass effect or midline shift. Vascular: No hyperdense vessel or unexpected calcification. Skull: Normal. Negative for fracture or focal lesion. Sinuses/Orbits: The paranasal sinuses are clear. A right lens implant is in place. The globes and orbits are otherwise unremarkable. Other: The mastoid air cells and middle ear cavities are clear. IMPRESSION: 1. No acute intracranial hemorrhage or large vessel territorial infarct. Possible age-indeterminate infarct in the anterior limb of the left internal capsule. 2. Extensive hypodensity in the supratentorial white matter, greater than expected for age. Findings may reflect accelerated small-vessel ischemic change or demyelinating disease. Findings discussed with Dr Pearlean Brownie at 11:22 am. Electronically Signed   By: Lesia Hausen M.D.   On: 01/30/2023 11:30   US Aorta  Result Date: 01/28/2023 CLINICAL DATA:  Back pain. EXAM: ULTRASOUND OF ABDOMINAL AORTA TECHNIQUE: Ultrasound examination of the abdominal aorta and proximal common iliac arteries was performed to evaluate for aneurysm. Additional color and Doppler images of the distal aorta were obtained to document patency. COMPARISON:  CT 01/28/2023 FINDINGS: Abdominal aortic measurements as follows: Proximal:  2.3 x 2.4 cm Mid:  1.9 x 2.3 cm Distal: Obscured by bowel gas Iliac vessels are obscured by bowel gas. Note is made of a umbilical hernia with some fluid. IMPRESSION: Partially obscured by overlapping bowel gas. What is seen is nonaneurysmal. Please correlate with the prior CT Electronically Signed   By: Piedad Climes.D.  On: 01/28/2023 16:55   CT ABDOMEN PELVIS WO CONTRAST  Result Date: 01/28/2023 CLINICAL DATA:  Back pain, AKI EXAM: CT ABDOMEN AND PELVIS WITHOUT CONTRAST TECHNIQUE: Multidetector CT imaging of the abdomen and pelvis was performed following the standard protocol without  IV contrast. RADIATION DOSE REDUCTION: This exam was performed according to the departmental dose-optimization program which includes automated exposure control, adjustment of the mA and/or kV according to patient size and/or use of iterative reconstruction technique. COMPARISON:  Renal ultrasound 12/18/2019 FINDINGS: Lower chest: Small right pleural effusion. Mild interlobular septal thickening and mild ground-glass opacities likely due to edema. Cardiomegaly. Small pericardial effusion. Hepatobiliary: Unremarkable noncontrast appearance of the liver. Gallbladder and biliary tree are unremarkable. Pancreas: Unremarkable. Spleen: Unremarkable. Adrenals/Urinary Tract: Unremarkable adrenal glands. Nonspecific bilateral perinephric stranding. No urinary calculi or hydronephrosis. Unremarkable bladder. Stomach/Bowel: Normal caliber large and small bowel. Normal appendix. Stomach is within normal limits. Vascular/Lymphatic: Aortic atherosclerosis. No enlarged abdominal or pelvic lymph nodes. Reproductive: Globular enlargement of the uterus likely due to fibroids. No adnexal mass. Other: Fat containing periumbilical hernia. Diffuse body wall edema. No free intraperitoneal fluid or air. Musculoskeletal: No acute fracture. Bilateral L5 pars defects with grade 1 anterolisthesis of L5 on S1. Advanced disc space height loss at L5-S1. IMPRESSION: 1. Small right pleural effusion. Mild interlobular septal thickening and mild ground-glass opacities likely due to edema. 2. Cardiomegaly with small pericardial effusion. 3. No definite acute abnormality in the abdomen or pelvis. Bilateral perinephric stranding is nonspecific and may be normal or due to edema. Pyelonephritis is difficult to exclude without IV contrast. 4. Chronic bilateral L5 spondylolysis with grade 1 spondylolisthesis. 5. Fibroid uterus. Aortic Atherosclerosis (ICD10-I70.0). Electronically Signed   By: Minerva Fester M.D.   On: 01/28/2023 16:23   (Echo, Carotid,  EGD, Colonoscopy, ERCP)    Subjective: Seen and examined.  No overnight events.  She is very excited to be going home.  Called and discussed with her sister and updated.   Discharge Exam: Vitals:   02/04/23 0546 02/04/23 0822  BP: (!) 186/94 (!) 174/94  Pulse: 88 87  Resp:    Temp: (!) 97.5 F (36.4 C)   SpO2: 94%    Vitals:   02/03/23 1946 02/03/23 2331 02/04/23 0546 02/04/23 0822  BP: (!) 152/80 (!) 161/98 (!) 186/94 (!) 174/94  Pulse: 84 90 88 87  Resp:  20    Temp: 97.7 F (36.5 C) 97.7 F (36.5 C) (!) 97.5 F (36.4 C)   TempSrc: Oral Oral Oral   SpO2: 92% 98% 94%   Weight:      Height:        General: Pt is alert, awake, not in acute distress On 2 L oxygen at rest.  Pleasant to conversation.  Baseline cognition deficits. Cardiovascular: RRR, S1/S2 +, no rubs, no gallops Respiratory: CTA bilaterally, no wheezing, no rhonchi Abdominal: Soft, NT, ND, bowel sounds + Extremities: Trace edema of all extremities.  No deformities.    The results of significant diagnostics from this hospitalization (including imaging, microbiology, ancillary and laboratory) are listed below for reference.     Microbiology: Recent Results (from the past 240 hour(s))  MRSA Next Gen by PCR, Nasal     Status: Abnormal   Collection Time: 01/28/23 10:50 PM   Specimen: Nasal Mucosa; Nasal Swab  Result Value Ref Range Status   MRSA by PCR Next Gen DETECTED (A) NOT DETECTED Final    Comment: (NOTE) The GeneXpert MRSA Assay (FDA approved for NASAL specimens only),  is one component of a comprehensive MRSA colonization surveillance program. It is not intended to diagnose MRSA infection nor to guide or monitor treatment for MRSA infections. Test performance is not FDA approved in patients less than 52 years old. Performed at [facility], [address], [place], [place] [address]      Labs: BNP (last 3 results) No results for input(s): "BNP" in the last 8760  hours. Basic Metabolic Panel: Recent Labs  Lab 01/30/23 1303 02/01/23 0313 02/02/23 0821 02/03/23 0258 02/04/23 0829  NA 138 133* 135 137 137  K 4.2 5.1 5.3* 5.3* 5.3*  CL 113* 110 111 109 110  CO2 17* 17* 17* 19* 21*  GLUCOSE 110* 99 93 143* 142*  BUN 36* 39* 40* 40* 39*  CREATININE 3.59* 4.17* 4.12* 4.23* 4.05*  CALCIUM 7.1* 8.0* 8.6* 8.7* 8.9  MG  --  1.9  --   --   --   PHOS  --  4.4  --   --   --    Liver Function Tests: No results for input(s): "AST", "ALT", "ALKPHOS", "BILITOT", "PROT", "ALBUMIN" in the last 168 hours. No results for input(s): "LIPASE", "AMYLASE" in the last 168 hours. Recent Labs  Lab 01/30/23 1303  AMMONIA <10   CBC: Recent Labs  Lab 01/28/23 1436 01/29/23 0320 02/01/23 0313 02/02/23 0821  WBC 3.9* 4.7 3.9* 4.1  NEUTROABS  --   --  2.8  --   HGB 10.7* 9.3* 8.9* 8.7*  HCT 34.4* 30.5* 29.1* 29.3*  MCV 86.2 89.2 89.5 91.0  PLT 286 258 261 272   Cardiac Enzymes: No results for input(s): "CKTOTAL", "CKMB", "CKMBINDEX", "TROPONINI" in the last 168 hours. BNP: Invalid input(s): "POCBNP" CBG: Recent Labs  Lab 02/03/23 0758 02/03/23 1141 02/03/23 1648 02/03/23 1954 02/04/23 0731  GLUCAP 105* 110* 141* 171* 160*   D-Dimer No results for input(s): "DDIMER" in the last 72 hours. Hgb A1c No results for input(s): "HGBA1C" in the last 72 hours. Lipid Profile No results for input(s): "CHOL", "HDL", "LDLCALC", "TRIG", "CHOLHDL", "LDLDIRECT" in the last 72 hours. Thyroid function studies No results for input(s): "TSH", "T4TOTAL", "T3FREE", "THYROIDAB" in the last 72 hours.  Invalid input(s): "FREET3" Anemia work up No results for input(s): "VITAMINB12", "FOLATE", "FERRITIN", "TIBC", "IRON", "RETICCTPCT" in the last 72 hours. Urinalysis    Component Value Date/Time   COLORURINE YELLOW 01/28/2023 1629   APPEARANCEUR HAZY (A) 01/28/2023 1629   LABSPEC 1.017 01/28/2023 1629   PHURINE 5.0 01/28/2023 1629   GLUCOSEU 50 (A) 01/28/2023 1629    HGBUR NEGATIVE 01/28/2023 1629   BILIRUBINUR NEGATIVE 01/28/2023 1629   KETONESUR NEGATIVE 01/28/2023 1629   PROTEINUR >=300 (A) 01/28/2023 1629   NITRITE NEGATIVE 01/28/2023 1629   LEUKOCYTESUR NEGATIVE 01/28/2023 1629   Sepsis Labs Recent Labs  Lab 01/28/23 1436 01/29/23 0320 02/01/23 0313 02/02/23 0821  WBC 3.9* 4.7 3.9* 4.1   Microbiology Recent Results (from the past 240 hour(s))  MRSA Next Gen by PCR, Nasal     Status: Abnormal   Collection Time: 01/28/23 10:50 PM   Specimen: Nasal Mucosa; Nasal Swab  Result Value Ref Range Status   MRSA by PCR Next Gen DETECTED (A) NOT DETECTED Final    Comment: (NOTE) The GeneXpert MRSA Assay (FDA approved for NASAL specimens only), is one component of a comprehensive MRSA colonization surveillance program. It is not intended to diagnose MRSA infection nor to guide or monitor treatment for MRSA infections. Test performance is not FDA approved in  patients less than 101 years old. Performed at [facility], [address] [place], [place] [address]      Time coordinating discharge: 35 minutes  SIGNED:   Dorcas Carrow, MD  [facility] 02/04/2023, 12:03 PM

## 2023-02-04 NOTE — Progress Notes (Signed)
Physical Therapy Treatment Patient Details Name: Joanna [name] MRN: [id number] DOB: [birth year] Today's Date: 02/04/2023   History of Present Illness Patient is a 52 year old female who presented from home with 5 day history of back pain. Patient was admitted with hypertensive urgency, AKI and back pain. PMH: type II DM, intellectual disability, obesity. Code stroke on 8/14, OZH:YQMV punctate right internal capsule infarct with extensive changes of chronic small vessel disease.  Patient has no focal neurological symptoms on the left side this is likely a silent incidental infarct per neurology    PT Comments  General Comments: Pt Hx intellectual Disability pleasant and cooperative. Able to express needs.  A little impulsive. Pt found in recliner without her oxygen on.  Sats 85%.  Reapplied 2 lts to achieve 91%.  Assisted with amb.  General transfer comment: quick and impulsive but self able.  Also during toilet transfer.  Good use of hands to steady self and perform self peri care. General Gait Details: assisted with amb to bathroom then in hallway a total of 85 feet.  Pt required 3 lts during amb to achieve sats >90%.  Dyspnea 2/4.  Educated on "deep breathing" and to decrease her gait spped.  Impulsive.  SATURATION QUALIFICATIONS: (This note is used to comply with regulatory documentation for home oxygen)  Patient Saturations on Room Air at Rest = 85%  Patient Saturations on Room Air while Ambulating = 83%  Patient Saturations on 3 Liters of oxygen while Ambulating = 90%  Please briefly explain why patient needs home oxygen:  Pt requires 2 lts supplemental oxygen during rest and 3 lts during activity to achieve therapeutic level.    Pt plans to D/C back home with Sister.    If plan is discharge home, recommend the following:     Can travel by private vehicle        Equipment Recommendations  None recommended by PT    Recommendations for Other Services       Precautions / Restrictions  Precautions Precautions: Fall Restrictions Weight Bearing Restrictions: No Other Position/Activity Restrictions: monitor O2 saturation     Mobility  Bed Mobility               General bed mobility comments: OOB in recliner    Transfers Overall transfer level: Needs assistance Equipment used: None Transfers: Sit to/from Stand Sit to Stand: Modified independent (Device/Increase time)           General transfer comment: quick and impulsive but self able.  Also during toilet transfer.  Good use of hands to steady self and perform self peri care.    Ambulation/Gait Ambulation/Gait assistance: Supervision Gait Distance (Feet): 85 Feet Assistive device: None Gait Pattern/deviations: Step-through pattern, Drifts right/left Gait velocity: too fast fatigues quickly     General Gait Details: assisted with amb to bathroom then in hallway a total of 85 feet.  Pt required 3 lts during amb to achieve sats >90%.  Dyspnea 2/4.  Educated on "deep breathing" and to decrease her gait spped.  Impulsive.   Stairs             Wheelchair Mobility     Tilt Bed    Modified Rankin (Stroke Patients Only)       Balance  Cognition Arousal: Alert Behavior During Therapy: WFL for tasks assessed/performed Overall Cognitive Status: Within Functional Limits for tasks assessed                                 General Comments: Pt Hx intellectual Disability pleasant and cooperative. Able to express needs.  A little impulsive.        Exercises      General Comments        Pertinent Vitals/Pain Pain Assessment Pain Assessment: No/denies pain    Home Living                          Prior Function            PT Goals (current goals can now be found in the care plan section) Progress towards PT goals: Progressing toward goals    Frequency    Min 1X/week      PT Plan       Co-evaluation              AM-PAC PT "6 Clicks" Mobility   Outcome Measure  Help needed turning from your back to your side while in a flat bed without using bedrails?: None Help needed moving from lying on your back to sitting on the side of a flat bed without using bedrails?: None Help needed moving to and from a bed to a chair (including a wheelchair)?: None Help needed standing up from a chair using your arms (e.g., wheelchair or bedside chair)?: None Help needed to walk in hospital room?: A Little Help needed climbing 3-5 steps with a railing? : A Little 6 Click Score: 22    End of Session Equipment Utilized During Treatment: Gait belt Activity Tolerance: Patient tolerated treatment well Patient left: in chair;with call bell/phone within reach;with chair alarm set Nurse Communication: Mobility status PT Visit Diagnosis: Unsteadiness on feet (R26.81);Difficulty in walking, not elsewhere classified (R26.2)     Time: 1610-9604 PT Time Calculation (min) (ACUTE ONLY): 24 min  Charges:    $Gait Training: 8-22 mins $Therapeutic Activity: 8-22 mins PT General Charges $$ ACUTE PT VISIT: 1 Visit                     Felecia Shelling  PTA Acute  Rehabilitation Services Office M-F          [phone]

## 2023-02-04 NOTE — TOC Transition Note (Signed)
Transition of Care [facility]) - CM/SW Discharge Note   Patient Details  Name: Joanna [name] MRN: [id number] Date of Birth: 11-06-1970  Transition of Care [facility]) CM/SW Contact:  Lanier Clam, RN Phone Number: 02/04/2023, 1:02 PM   Clinical Narrative: qualified for home 02-[employer] rep Jermaine to deliver home 02 travel tank to rm prior d/c. No further CM needs.      Final next level of care: Home/Self Care Barriers to Discharge: No Barriers Identified   Patient Goals and CMS Choice CMS Medicare.gov Compare Post Acute Care list provided to:: Patient Choice offered to / list presented to : Patient  Discharge Placement                         Discharge Plan and Services Additional resources added to the After Visit Summary for     Discharge Planning Services: CM Consult Post Acute Care Choice: Durable Medical Equipment          DME Arranged: Oxygen DME Agency: [employer] Date DME Agency Contacted: 02/04/23 Time DME Agency Contacted: 1302 Representative spoke with at DME Agency: Vaughan Basta            Social Determinants of Health (SDOH) Interventions SDOH Screenings   Food Insecurity: No Food Insecurity (01/29/2023)  Housing: Low Risk  (01/29/2023)  Transportation Needs: No Transportation Needs (01/29/2023)  Recent Concern: Transportation Needs - Unmet Transportation Needs (11/30/2022)   Received from [facility]  Utilities: Not At Risk (01/29/2023)  Financial Resource Strain: Medium Risk (11/30/2022)   Received from [facility]  Physical Activity: Insufficiently Active (11/30/2022)   Received from [facility]  Social Connections: Socially Integrated (11/30/2022)   Received from [facility]  Stress: No Stress Concern Present (11/30/2022)   Received from [facility]  Tobacco Use: Low Risk  (01/28/2023)     Readmission Risk Interventions     No data to display

## 2023-02-04 NOTE — TOC CM/SW Note (Signed)
    Durable Medical Equipment  (From admission, onward)           Start     Ordered   02/04/23 1243  For home use only DME oxygen  Once       Question Answer Comment  Length of Need Lifetime   Mode or (Route) Nasal cannula   Liters per Minute 3   Frequency Continuous (stationary and portable oxygen unit needed)   Oxygen conserving device Yes   Oxygen delivery system Gas      02/04/23 1243

## 2023-02-04 NOTE — TOC Progression Note (Signed)
Transition of Care [facility]) - Progression Note    Patient Details  Name: Joanna [name] MRN: [id number] Date of Birth: 1971/04/18  [facility]) CM/SW Contact  Abril Cappiello, Olegario Messier, RN Phone Number: 02/04/2023, 12:09 PM  Clinical Narrative: If home 02 needed await documented 02 sats. MD notified.           Expected Discharge Plan and Services         Expected Discharge Date: 02/04/23                                     Social Determinants of Health (SDOH) Interventions SDOH Screenings   Food Insecurity: No Food Insecurity (01/29/2023)  Housing: Low Risk  (01/29/2023)  Transportation Needs: No Transportation Needs (01/29/2023)  Recent Concern: Transportation Needs - Unmet Transportation Needs (11/30/2022)   Received from [facility]  Utilities: Not At Risk (01/29/2023)  Financial Resource Strain: Medium Risk (11/30/2022)   Received from [facility]  Physical Activity: Insufficiently Active (11/30/2022)   Received from [facility]  Social Connections: Socially Integrated (11/30/2022)   Received from [facility]  Stress: No Stress Concern Present (11/30/2022)   Received from [facility]  Tobacco Use: Low Risk  (01/28/2023)    Readmission Risk Interventions     No data to display

## 2023-02-04 NOTE — Plan of Care (Signed)
  Problem: Fluid Volume: Goal: Ability to maintain a balanced intake and output will improve Outcome: Progressing   Problem: Metabolic: Goal: Ability to maintain appropriate glucose levels will improve Outcome: Progressing   Problem: Skin Integrity: Goal: Risk for impaired skin integrity will decrease Outcome: Progressing   Problem: Education: Goal: Knowledge of General Education information will improve Description: Including pain rating scale, medication(s)/side effects and non-pharmacologic comfort measures Outcome: Progressing   Problem: Clinical Measurements: Goal: Respiratory complications will improve Outcome: Progressing Goal: Cardiovascular complication will be avoided Outcome: Progressing

## 2023-02-04 NOTE — TOC Transition Note (Addendum)
Transition of Care [facility]) - CM/SW Discharge Note   Patient Details  Name: Joanna [name] MRN: [id number] Date of Birth: 09/25/1970  Transition of Care [facility]) CM/SW Contact:  Lanier Clam, RN Phone Number: 02/04/2023, 2:18 PM [employer] rep Marthann Schiller accepted for home 02/all info in place, & order-to deliver home 02 travel tank to rm prior d/c. No further CM needs. -2:47p-per [employer] rep Mitch patient already receiving home 02 with [employer]-they will deliver travel tank to rm prior d/c. Clinical Narrative:       Final next level of care: Home/Self Care Barriers to Discharge: No Barriers Identified   Patient Goals and CMS Choice CMS Medicare.gov Compare Post Acute Care list provided to:: Patient Choice offered to / list presented to : Patient  Discharge Placement                         Discharge Plan and Services Additional resources added to the After Visit Summary for     Discharge Planning Services: CM Consult Post Acute Care Choice: Durable Medical Equipment          DME Arranged: Oxygen DME Agency: [employer] Date DME Agency Contacted: 02/04/23 Time DME Agency Contacted: [phone] Representative spoke with at DME Agency: Marthann Schiller            Social Determinants of Health (SDOH) Interventions SDOH Screenings   Food Insecurity: No Food Insecurity (01/29/2023)  Housing: Low Risk  (01/29/2023)  Transportation Needs: No Transportation Needs (01/29/2023)  Recent Concern: Transportation Needs - Unmet Transportation Needs (11/30/2022)   Received from [facility]  Utilities: Not At Risk (01/29/2023)  Financial Resource Strain: Medium Risk (11/30/2022)   Received from [facility]  Physical Activity: Insufficiently Active (11/30/2022)   Received from [facility]  Social Connections: Socially Integrated (11/30/2022)   Received from [facility]  Stress: No Stress Concern Present (11/30/2022)   Received from [facility]  Tobacco Use: Low Risk  (01/28/2023)      Readmission Risk Interventions     No data to display

## 2023-02-06 ENCOUNTER — Other Ambulatory Visit (HOSPITAL_COMMUNITY): Payer: Self-pay

## 2023-04-05 ENCOUNTER — Other Ambulatory Visit (HOSPITAL_COMMUNITY): Payer: Self-pay

## 2023-04-11 ENCOUNTER — Other Ambulatory Visit (HOSPITAL_COMMUNITY): Payer: Self-pay

## 2023-04-12 ENCOUNTER — Other Ambulatory Visit (HOSPITAL_COMMUNITY): Payer: Self-pay

## 2023-04-26 ENCOUNTER — Encounter (HOSPITAL_COMMUNITY): Payer: Self-pay

## 2023-04-26 ENCOUNTER — Other Ambulatory Visit: Payer: Self-pay

## 2023-04-26 ENCOUNTER — Emergency Department (HOSPITAL_COMMUNITY): Payer: Medicare HMO

## 2023-04-26 ENCOUNTER — Inpatient Hospital Stay (HOSPITAL_COMMUNITY)
Admission: EM | Admit: 2023-04-26 | Discharge: 2023-05-03 | DRG: 291 | Disposition: A | Discharge status: Home Health | Payer: Medicare HMO | Attending: Internal Medicine | Admitting: Internal Medicine

## 2023-04-26 DIAGNOSIS — Z6841 Body Mass Index (BMI) 40.0 and over, adult: Secondary | ICD-10-CM

## 2023-04-26 DIAGNOSIS — E119 Type 2 diabetes mellitus without complications: Secondary | ICD-10-CM

## 2023-04-26 DIAGNOSIS — E66813 Obesity, class 3: Secondary | ICD-10-CM | POA: Diagnosis present

## 2023-04-26 DIAGNOSIS — N184 Chronic kidney disease, stage 4 (severe): Secondary | ICD-10-CM | POA: Diagnosis present

## 2023-04-26 DIAGNOSIS — I509 Heart failure, unspecified: Principal | ICD-10-CM

## 2023-04-26 DIAGNOSIS — I440 Atrioventricular block, first degree: Secondary | ICD-10-CM | POA: Diagnosis present

## 2023-04-26 DIAGNOSIS — E1122 Type 2 diabetes mellitus with diabetic chronic kidney disease: Secondary | ICD-10-CM | POA: Diagnosis present

## 2023-04-26 DIAGNOSIS — E872 Acidosis, unspecified: Secondary | ICD-10-CM | POA: Diagnosis not present

## 2023-04-26 DIAGNOSIS — Z8673 Personal history of transient ischemic attack (TIA), and cerebral infarction without residual deficits: Secondary | ICD-10-CM

## 2023-04-26 DIAGNOSIS — I5033 Acute on chronic diastolic (congestive) heart failure: Secondary | ICD-10-CM | POA: Diagnosis present

## 2023-04-26 DIAGNOSIS — Z5986 Financial insecurity: Secondary | ICD-10-CM | POA: Diagnosis not present

## 2023-04-26 DIAGNOSIS — I2510 Atherosclerotic heart disease of native coronary artery without angina pectoris: Secondary | ICD-10-CM | POA: Diagnosis present

## 2023-04-26 DIAGNOSIS — N179 Acute kidney failure, unspecified: Secondary | ICD-10-CM | POA: Diagnosis not present

## 2023-04-26 DIAGNOSIS — Z7984 Long term (current) use of oral hypoglycemic drugs: Secondary | ICD-10-CM

## 2023-04-26 DIAGNOSIS — N189 Chronic kidney disease, unspecified: Secondary | ICD-10-CM

## 2023-04-26 DIAGNOSIS — Z833 Family history of diabetes mellitus: Secondary | ICD-10-CM

## 2023-04-26 DIAGNOSIS — Z7982 Long term (current) use of aspirin: Secondary | ICD-10-CM | POA: Diagnosis not present

## 2023-04-26 DIAGNOSIS — E875 Hyperkalemia: Secondary | ICD-10-CM | POA: Diagnosis present

## 2023-04-26 DIAGNOSIS — Z79899 Other long term (current) drug therapy: Secondary | ICD-10-CM

## 2023-04-26 DIAGNOSIS — D649 Anemia, unspecified: Secondary | ICD-10-CM | POA: Insufficient documentation

## 2023-04-26 DIAGNOSIS — I2781 Cor pulmonale (chronic): Secondary | ICD-10-CM | POA: Diagnosis present

## 2023-04-26 DIAGNOSIS — I159 Secondary hypertension, unspecified: Secondary | ICD-10-CM | POA: Diagnosis not present

## 2023-04-26 DIAGNOSIS — D631 Anemia in chronic kidney disease: Secondary | ICD-10-CM | POA: Diagnosis present

## 2023-04-26 DIAGNOSIS — D509 Iron deficiency anemia, unspecified: Secondary | ICD-10-CM | POA: Diagnosis present

## 2023-04-26 DIAGNOSIS — J9601 Acute respiratory failure with hypoxia: Secondary | ICD-10-CM | POA: Diagnosis present

## 2023-04-26 DIAGNOSIS — Z8249 Family history of ischemic heart disease and other diseases of the circulatory system: Secondary | ICD-10-CM

## 2023-04-26 DIAGNOSIS — E1165 Type 2 diabetes mellitus with hyperglycemia: Secondary | ICD-10-CM | POA: Diagnosis present

## 2023-04-26 DIAGNOSIS — I10 Essential (primary) hypertension: Secondary | ICD-10-CM | POA: Diagnosis present

## 2023-04-26 DIAGNOSIS — I959 Hypotension, unspecified: Secondary | ICD-10-CM | POA: Diagnosis present

## 2023-04-26 DIAGNOSIS — I130 Hypertensive heart and chronic kidney disease with heart failure and stage 1 through stage 4 chronic kidney disease, or unspecified chronic kidney disease: Secondary | ICD-10-CM | POA: Diagnosis present

## 2023-04-26 DIAGNOSIS — F79 Unspecified intellectual disabilities: Secondary | ICD-10-CM | POA: Diagnosis present

## 2023-04-26 DIAGNOSIS — Z555 Less than a high school diploma: Secondary | ICD-10-CM | POA: Diagnosis not present

## 2023-04-26 DIAGNOSIS — R601 Generalized edema: Secondary | ICD-10-CM | POA: Diagnosis present

## 2023-04-26 LAB — CBC WITH DIFFERENTIAL/PLATELET
Abs Immature Granulocytes: 0.02 10*3/uL (ref 0.00–0.07)
Basophils Absolute: 0.1 10*3/uL (ref 0.0–0.1)
Basophils Relative: 1 %
Eosinophils Absolute: 0.1 10*3/uL (ref 0.0–0.5)
Eosinophils Relative: 3 %
HCT: 24.7 % — ABNORMAL LOW (ref 36.0–46.0)
Hemoglobin: 7.3 g/dL — ABNORMAL LOW (ref 12.0–15.0)
Immature Granulocytes: 1 %
Lymphocytes Relative: 13 %
Lymphs Abs: 0.5 10*3/uL — ABNORMAL LOW (ref 0.7–4.0)
MCH: 27 pg (ref 26.0–34.0)
MCHC: 29.6 g/dL — ABNORMAL LOW (ref 30.0–36.0)
MCV: 91.5 fL (ref 80.0–100.0)
Monocytes Absolute: 0.5 10*3/uL (ref 0.1–1.0)
Monocytes Relative: 12 %
Neutro Abs: 2.9 10*3/uL (ref 1.7–7.7)
Neutrophils Relative %: 70 %
Platelets: 242 10*3/uL (ref 150–400)
RBC: 2.7 MIL/uL — ABNORMAL LOW (ref 3.87–5.11)
RDW: 17.2 % — ABNORMAL HIGH (ref 11.5–15.5)
WBC: 4.2 10*3/uL (ref 4.0–10.5)
nRBC: 0 % (ref 0.0–0.2)

## 2023-04-26 LAB — BASIC METABOLIC PANEL
Anion gap: 8 (ref 5–15)
BUN: 46 mg/dL — ABNORMAL HIGH (ref 6–20)
CO2: 20 mmol/L — ABNORMAL LOW (ref 22–32)
Calcium: 8.5 mg/dL — ABNORMAL LOW (ref 8.9–10.3)
Chloride: 109 mmol/L (ref 98–111)
Creatinine, Ser: 3.71 mg/dL — ABNORMAL HIGH (ref 0.44–1.00)
GFR, Estimated: 14 mL/min — ABNORMAL LOW (ref >60)
Glucose, Bld: 83 mg/dL (ref 70–99)
Potassium: 5.2 mmol/L — ABNORMAL HIGH (ref 3.5–5.1)
Sodium: 137 mmol/L (ref 135–145)

## 2023-04-26 LAB — TROPONIN I (HIGH SENSITIVITY)
Troponin I (High Sensitivity): 14 ng/L (ref <18)
Troponin I (High Sensitivity): 14 ng/L (ref <18)

## 2023-04-26 LAB — ABO/RH: ABO/RH(D): A POS

## 2023-04-26 LAB — BRAIN NATRIURETIC PEPTIDE: B Natriuretic Peptide: 1342.9 pg/mL — ABNORMAL HIGH (ref 0.0–100.0)

## 2023-04-26 MED ORDER — ENOXAPARIN SODIUM 40 MG/0.4ML IJ SOSY
40.0000 mg | PREFILLED_SYRINGE | INTRAMUSCULAR | Status: DC
  Administered 2023-04-27 – 2023-04-28 (×2): 40 mg via SUBCUTANEOUS
  Filled 2023-04-26 (×2): qty 0.4

## 2023-04-26 MED ORDER — FUROSEMIDE 10 MG/ML IJ SOLN
40.0000 mg | Freq: Once | INTRAMUSCULAR | Status: AC
  Administered 2023-04-26: 40 mg via INTRAVENOUS
  Filled 2023-04-26: qty 4

## 2023-04-26 MED ORDER — POLYETHYLENE GLYCOL 3350 17 G PO PACK: 17.0000 g | PACK | Freq: Every day | ORAL | Status: DC | PRN

## 2023-04-26 MED ORDER — ACETAMINOPHEN 650 MG RE SUPP: 650.0000 mg | Freq: Four times a day (QID) | RECTAL | Status: DC | PRN

## 2023-04-26 MED ORDER — SODIUM CHLORIDE 0.9% FLUSH
3.0000 mL | Freq: Two times a day (BID) | INTRAVENOUS | Status: DC
  Administered 2023-04-26 – 2023-05-03 (×14): 3 mL via INTRAVENOUS

## 2023-04-26 MED ORDER — FUROSEMIDE 10 MG/ML IJ SOLN
40.0000 mg | Freq: Two times a day (BID) | INTRAMUSCULAR | Status: DC
  Administered 2023-04-27 (×2): 40 mg via INTRAVENOUS
  Filled 2023-04-26 (×2): qty 4

## 2023-04-26 MED ORDER — ACETAMINOPHEN 325 MG PO TABS
650.0000 mg | ORAL_TABLET | Freq: Four times a day (QID) | ORAL | Status: DC | PRN
  Administered 2023-04-29: 650 mg via ORAL
  Filled 2023-04-26: qty 2

## 2023-04-26 MED ORDER — SODIUM ZIRCONIUM CYCLOSILICATE 10 G PO PACK
10.0000 g | PACK | ORAL | Status: AC
  Administered 2023-04-26: 10 g via ORAL
  Filled 2023-04-26: qty 1

## 2023-04-26 NOTE — ED Notes (Signed)
Patient's family has left, patient assisted with the bedpan and is watching tv.

## 2023-04-26 NOTE — Assessment & Plan Note (Deleted)
Associated with left pleural effusion, sensation of shortness of breath, and mild hypoxemia noted by EMS and route to the hospital.  Patient has stabilized on 1 to 2 L/min of supplementary oxygen and appears to be stable.  Patient describes having prior episodes of similar leg swelling and shortness of breath.  However last hospitalization in August 2024, does not seem to corroborate this based on documentation review or discharge med rec review.  Patient had a recent echo on file at the time.  See above, concern raised for amyloidosis.  I will check serum SPEP.  I will also check urine protein for creatinine ratio.  Patient may need abdominal fat pad biopsy based on results of above.  In the meantime I will start the patient on Lasix 40 IV twice daily and monitor her electrolytes.  Another potential etiology of fluid overload would be patient's chronic kidney disease that has been progressive.

## 2023-04-26 NOTE — ED Triage Notes (Signed)
Pt arrives via ems to the er for the c/o sob x 1week, worsened the past couple days. Pt was 88% with ems, placed on 4l via [place] for 100% 02. Hx chf, bp and hr stable per ems

## 2023-04-26 NOTE — ED Provider Notes (Signed)
[place] EMERGENCY DEPARTMENT AT [facility] Provider Note   CSN: [id number] Arrival date & time: 04/26/23  1647     History  Chief Complaint  Patient presents with   Shortness of Breath    Joanna [name] is a 52 y.o. female.  Patient is a 52 year old female with a past medical history of intellectual disability, CHF, CKD stage IV, hypertension and diabetes presenting to the emergency department with shortness of breath.  Patient states that she has had increasing shortness of breath over the last 1 to 2 weeks as well as increased swelling in her legs.  She denies any associated chest pain.  She denies any fever or cough.  She states that she was recently taken off her Lasix due to her worsening kidney function.  Was found to be hypoxic to 88% on room air on EMS arrival and was placed on nasal cannula and route with improvement.  The history is provided by the patient and a relative.  Shortness of Breath      Home Medications Prior to Admission medications   Medication Sig Start Date End Date Taking? Authorizing Provider  amLODipine (NORVASC) 10 MG tablet Take 1 tablet (10 mg total) by mouth daily. 02/05/23 03/07/23  Dorcas Carrow, MD  aspirin EC 81 MG EC tablet Take 1 tablet (81 mg total) by mouth daily. Swallow whole. 12/23/19   Jerald Kief, MD  atorvastatin (LIPITOR) 40 MG tablet Take 1 tablet (40 mg total) by mouth daily. 02/05/23 03/07/23  Dorcas Carrow, MD  carvedilol (COREG) 12.5 MG tablet Take 1 tablet (12.5 mg total) by mouth 2 (two) times daily with a meal. 02/04/23 03/06/23  Dorcas Carrow, MD  hydrALAZINE (APRESOLINE) 100 MG tablet Take 1 tablet (100 mg total) by mouth every 8 (eight) hours. 02/04/23 03/06/23  Dorcas Carrow, MD  isosorbide mononitrate (IMDUR) 120 MG 24 hr tablet Take 1 tablet (120 mg total) by mouth daily. 12/23/19 07/26/21  Jerald Kief, MD      Allergies    Patient has no known allergies.    Review of Systems   Review of Systems  Respiratory:   Positive for shortness of breath.     Physical Exam Updated Vital Signs BP (!) 116/52   Pulse 74   Temp 98.1 F (36.7 C) (Oral)   Resp 18   Ht 5\' 2"  (1.575 m)   Wt 101.7 kg   SpO2 99%   BMI 41.01 kg/m  Physical Exam Vitals and nursing note reviewed.  Constitutional:      General: She is not in acute distress.    Appearance: She is well-developed.  HENT:     Head: Normocephalic and atraumatic.     Mouth/Throat:     Mouth: Mucous membranes are moist.  Eyes:     Extraocular Movements: Extraocular movements intact.  Neck:     Vascular: JVD present.  Cardiovascular:     Rate and Rhythm: Normal rate and regular rhythm.  Pulmonary:     Effort: Pulmonary effort is normal.     Breath sounds: Rales (Bilateral bases) present.  Abdominal:     Palpations: Abdomen is soft.     Tenderness: There is no abdominal tenderness.  Musculoskeletal:        General: Normal range of motion.     Cervical back: Normal range of motion and neck supple.     Right lower leg: Edema (2+ edema up to the lower abdomen) present.     Left lower leg:  Edema (2+ edema to lower abdomen) present.  Skin:    General: Skin is warm and dry.  Neurological:     General: No focal deficit present.     Mental Status: She is alert and oriented to person, place, and time.  Psychiatric:        Mood and Affect: Mood normal.        Behavior: Behavior normal.     ED Results / Procedures / Treatments   Labs (all labs ordered are listed, but only abnormal results are displayed) Labs Reviewed  CBC WITH DIFFERENTIAL/PLATELET - Abnormal; Notable for the following components:      Result Value   RBC 2.70 (*)    Hemoglobin 7.3 (*)    HCT 24.7 (*)    MCHC 29.6 (*)    RDW 17.2 (*)    Lymphs Abs 0.5 (*)    All other components within normal limits  BASIC METABOLIC PANEL - Abnormal; Notable for the following components:   Potassium 5.2 (*)    CO2 20 (*)    BUN 46 (*)    Creatinine, Ser 3.71 (*)    Calcium 8.5 (*)     GFR, Estimated 14 (*)    All other components within normal limits  BRAIN NATRIURETIC PEPTIDE - Abnormal; Notable for the following components:   B Natriuretic Peptide 1,342.9 (*)    All other components within normal limits  TROPONIN I (HIGH SENSITIVITY)  TROPONIN I (HIGH SENSITIVITY)    EKG EKG Interpretation Date/Time:  Friday April 26 2023 16:59:52 EST Ventricular Rate:  80 PR Interval:  240 QRS Duration:  83 QT Interval:  402 QTC Calculation: 461 R Axis:   56  Text Interpretation: Sinus rhythm Prolonged PR interval Low voltage, extremity leads No significant change since last tracing Confirmed by Elayne Snare ([id number]) on 04/26/2023 5:03:04 PM  Radiology DG Chest Port 1 View  Result Date: 04/26/2023 CLINICAL DATA:  Shortness of breath. EXAM: PORTABLE CHEST 1 VIEW COMPARISON:  12/16/2019 FINDINGS: Chronic cardiomegaly. Vascular congestion. Interstitial thickening may represent edema. No large pleural effusion, left lung base assessment is limited due to soft tissue attenuation. No pneumothorax. IMPRESSION: Chronic cardiomegaly with vascular congestion and possible interstitial edema. Electronically Signed   By: Narda Rutherford M.D.   On: 04/26/2023 20:50    Procedures Procedures    Medications Ordered in ED Medications  furosemide (LASIX) injection 40 mg (has no administration in time range)    ED Course/ Medical Decision Making/ A&P Clinical Course as of 04/26/23 2104  [facility] Apr 26, 2023  2000 Hgb mild drop from baseline. Cr improved from baseline, significant elevated BNP. Was ordered lasix. Mild hyperkalemia, no hyperkalemic EKG changes. Patient requires admission for diuresis.  [VK]    Clinical Course User Index [VK] Rexford Maus, DO                                 Medical Decision Making This patient presents to the ED with chief complaint(s) of shortness of breath, lower extremity swelling with pertinent past medical history of CHF, CKD,  hypertension, diabetes which further complicates the presenting complaint. The complaint involves an extensive differential diagnosis and also carries with it a high risk of complications and morbidity.    The differential diagnosis includes CHF exacerbation, worsening renal function, ACS, arrhythmia, anemia, pneumonia, pneumothorax, pleural effusion  Additional history obtained: Additional history obtained from family Records reviewed previous admission  documents and Primary Care Documents  ED Course and Reassessment: On patient's arrival she was hemodynamically stable in no acute distress, initially satting well on room air was desatting to low 90s on my evaluation and was placed back on 1 L nasal cannula with improvement.  She does have significant pitting edema with JVD and crackles at the lung bases concerning for CHF exacerbation especially in the setting of recently discontinuing her Lasix.  Patient will have labs and chest x-ray performed.  EKG on arrival showed normal sinus rhythm without acute ischemic changes.  Likely will need admission for diuresis.  Independent labs interpretation:  The following labs were independently interpreted: Elevated BNP, creatinine improved from baseline, mild hyperkalemia, mild anemia from baseline  Independent visualization of imaging: - I independently visualized the following imaging with scope of interpretation limited to determining acute life threatening conditions related to emergency care: Chest x-ray, which revealed pulmonary edema  Consultation: - Consulted or discussed management/test interpretation w/ external professional: Hospitalist  Consideration for admission or further workup: Patient requires admission for diuresis Social Determinants of health: N/A    Amount and/or Complexity of Data Reviewed Labs: ordered. Radiology: ordered.  Risk Prescription drug management. Decision regarding hospitalization.          Final  Clinical Impression(s) / ED Diagnoses Final diagnoses:  Acute on chronic congestive heart failure, unspecified heart failure type [facility])    Rx / DC Orders ED Discharge Orders     None         Rexford Maus, DO 04/26/23 2104

## 2023-04-26 NOTE — Assessment & Plan Note (Signed)
I will hyperkalemia in the setting of CKD.  No peaked T waves on EKG.  Will give 10 mg of Lokelma now, should response to Lasix use as well.  Recheck again in morning

## 2023-04-26 NOTE — H&P (Signed)
History and Physical    Patient: Joanna [name] NGE:[id number] DOB: 02-Dec-1970 DOA: 04/26/2023 DOS: the patient was seen and examined on 04/26/2023 PCP: Hillery Aldo, NP  Patient coming from: Home  Chief Complaint:  Chief Complaint  Patient presents with   Shortness of Breath   HPI: Joanna [name] is a 52 y.o. female with medical history significant of prior known/documented diastolic heart failure.  Patient reports previous episodes of hospitalization for swelling and shortness of breath.  Patient reports being in her usual state of health till approximately 10 to 7 days ago.  At the time patient reports new onset of bilateral leg swelling that has been progressive since then associated with sensation of shortness of breath.  Shortness of breath is worse with exertion such as walking fast.  And is now more pronounced as the swelling has gotten worse and actually reached a little bit up to her abdomen.  Patient denies any chest pain palpitation any paroxysmal nocturnal dyspnea or any fevers.  No vomiting or diarrhea reported.  Patient reports that her sisters insisted that she come to the ER today.  Per report from the ER attending, patient was found to be slightly hypoxic to 88% on EMS evaluation and has been on 1 to 2 L/min of supplementary oxygen since then.  Patient has been given Lasix in the ER.  Medical evaluation is sought  Patient last hospitalization seems to have ended on February 04, 2023 when she was treated for hypertensive urgency as well as acute right MCA ischemic stroke  Patient currently reports no complaints while she is resting in the stretcher Review of Systems: As mentioned in the history of present illness. All other systems reviewed and are negative. Past Medical History:  Diagnosis Date   Acute diastolic heart failure (HCC) 12/16/2019   Acute respiratory failure with hypoxia (HCC) 12/16/2019   AKI (acute kidney injury) (HCC) 12/16/2019   Anasarca 12/16/2019   CAD (coronary  artery disease)    Diabetes mellitus without complication (HCC)    HTN (hypertension) 12/16/2019   Hypertension    Hypertensive urgency 12/16/2019   Intellectual disability 12/16/2019   Morbid obesity (HCC)    New onset of congestive heart failure (HCC) 12/16/2019   Obesity, Class III, BMI 40-49.9 (morbid obesity) (HCC) 12/16/2019   Type 2 diabetes mellitus without complication (HCC) 12/16/2019   History reviewed. No pertinent surgical history. Social History:  reports that she has never smoked. She has never used smokeless tobacco. She reports that she does not currently use alcohol. She reports that she does not use drugs.  No Known Allergies  Family History  Problem Relation Age of Onset   Heart disease Mother        Enlarged heart per patient   Hypertension Mother    Diabetes Mother    Hypertension Sister    Diabetes Sister     Prior to Admission medications   Medication Sig Start Date End Date Taking? Authorizing Provider  amLODipine (NORVASC) 10 MG tablet Take 1 tablet (10 mg total) by mouth daily. 02/05/23 04/25/24 Yes Dorcas Carrow, MD  aspirin EC 81 MG EC tablet Take 1 tablet (81 mg total) by mouth daily. Swallow whole. 12/23/19  Yes Jerald Kief, MD  atorvastatin (LIPITOR) 40 MG tablet Take 1 tablet (40 mg total) by mouth daily. 02/05/23 04/25/24 Yes Dorcas Carrow, MD  carvedilol (COREG) 12.5 MG tablet Take 1 tablet (12.5 mg total) by mouth 2 (two) times daily with a meal. 02/04/23 04/25/24 Yes Ghimire,  Lyndel Safe, MD  cloNIDine (CATAPRES) 0.1 MG tablet Take 0.1 mg by mouth 3 (three) times daily. 10/31/22  Yes [provider]  sodium bicarbonate 650 MG tablet Take 1,300 mg by mouth 2 (two) times daily. 03/21/23  Yes [provider]  empagliflozin (JARDIANCE) 10 MG TABS tablet Take 10 mg by mouth daily. 10/19/22   [provider]  furosemide (LASIX) 80 MG tablet Take 80 mg by mouth 2 (two) times daily. 10/19/22   [provider]  hydrALAZINE  (APRESOLINE) 100 MG tablet Take 1 tablet (100 mg total) by mouth every 8 (eight) hours. 02/04/23 03/06/23  Dorcas Carrow, MD  isosorbide mononitrate (IMDUR) 120 MG 24 hr tablet Take 1 tablet (120 mg total) by mouth daily. 12/23/19 07/26/21  Jerald Kief, MD   Patient does not remember her medications and cannot share the same with me. Physical Exam: Vitals:   04/26/23 1945 04/26/23 2000 04/26/23 2059 04/26/23 2115  BP: (!) 153/102 (!) 116/52 138/81 (!) 153/109  Pulse: 77 74 69 76  Resp: (!) 22 18 18 20   Temp:   98 F (36.7 C)   TempSrc:   Oral   SpO2: 100% 99% 100% 98%  Weight:      Height:       General: Obese appearing lady in no apparent distress.  On 1 L/min of supplementary oxygen.  Patient does have chronic intellectual disability and has trouble recalling some details of her health care such as any of her medications. Respiratory exam: Bilateral intravesicular fine crackles are heard bilateral bases posteriorly with some left interscapular crackles.  Otherwise good excursion no expiratory breath sounds Cardiovascular exam soft S1 soft S2 no murmur rubs or gallops are appreciated Extremities: Warm 1-2+ edema, trace edema up to mid thigh. Data Reviewed:  Labs on Admission:  Results for orders placed or performed during the hospital encounter of 04/26/23 (from the past 24 hour(s))  CBC with Differential     Status: Abnormal   Collection Time: 04/26/23  5:20 PM  Result Value Ref Range   WBC 4.2 4.0 - 10.5 K/uL   RBC 2.70 (L) 3.87 - 5.11 MIL/uL   Hemoglobin 7.3 (L) 12.0 - 15.0 g/dL   HCT 40.9 (L) 81.1 - 91.4 %   MCV 91.5 80.0 - 100.0 fL   MCH 27.0 26.0 - 34.0 pg   MCHC 29.6 (L) 30.0 - 36.0 g/dL   RDW 78.2 (H) 95.6 - 21.3 %   Platelets 242 150 - 400 K/uL   nRBC 0.0 0.0 - 0.2 %   Neutrophils Relative % 70 %   Neutro Abs 2.9 1.7 - 7.7 K/uL   Lymphocytes Relative 13 %   Lymphs Abs 0.5 (L) 0.7 - 4.0 K/uL   Monocytes Relative 12 %   Monocytes Absolute 0.5 0.1 - 1.0 K/uL    Eosinophils Relative 3 %   Eosinophils Absolute 0.1 0.0 - 0.5 K/uL   Basophils Relative 1 %   Basophils Absolute 0.1 0.0 - 0.1 K/uL   Immature Granulocytes 1 %   Abs Immature Granulocytes 0.02 0.00 - 0.07 K/uL  Basic metabolic panel     Status: Abnormal   Collection Time: 04/26/23  5:20 PM  Result Value Ref Range   Sodium 137 135 - 145 mmol/L   Potassium 5.2 (H) 3.5 - 5.1 mmol/L   Chloride 109 98 - 111 mmol/L   CO2 20 (L) 22 - 32 mmol/L   Glucose, Bld 83 70 - 99 mg/dL   BUN 46 (  H) 6 - 20 mg/dL   Creatinine, Ser 6.21 (H) 0.44 - 1.00 mg/dL   Calcium 8.5 (L) 8.9 - 10.3 mg/dL   GFR, Estimated 14 (L) >60 mL/min   Anion gap 8 5 - 15  Troponin I (High Sensitivity)     Status: None   Collection Time: 04/26/23  5:20 PM  Result Value Ref Range   Troponin I (High Sensitivity) 14 <18 ng/L  Brain natriuretic peptide     Status: Abnormal   Collection Time: 04/26/23  6:00 PM  Result Value Ref Range   B Natriuretic Peptide 1,342.9 (H) 0.0 - 100.0 pg/mL  Troponin I (High Sensitivity)     Status: None   Collection Time: 04/26/23  9:03 PM  Result Value Ref Range   Troponin I (High Sensitivity) 14 <18 ng/L   Basic Metabolic Panel: Recent Labs  Lab 04/26/23 1720  NA 137  K 5.2*  CL 109  CO2 20*  GLUCOSE 83  BUN 46*  CREATININE 3.71*  CALCIUM 8.5*   Liver Function Tests: No results for input(s): "AST", "ALT", "ALKPHOS", "BILITOT", "PROT", "ALBUMIN" in the last 168 hours. No results for input(s): "LIPASE", "AMYLASE" in the last 168 hours. No results for input(s): "AMMONIA" in the last 168 hours. CBC: Recent Labs  Lab 04/26/23 1720  WBC 4.2  NEUTROABS 2.9  HGB 7.3*  HCT 24.7*  MCV 91.5  PLT 242   Cardiac Enzymes: Recent Labs  Lab 04/26/23 1720 04/26/23 2103  TROPONINIHS 14 14    BNP (last 3 results) No results for input(s): "PROBNP" in the last 8760 hours. CBG: No results for input(s): "GLUCAP" in the last 168 hours.  Radiological Exams on Admission:  DG Chest Port  1 View  Result Date: 04/26/2023 CLINICAL DATA:  Shortness of breath. EXAM: PORTABLE CHEST 1 VIEW COMPARISON:  12/16/2019 FINDINGS: Chronic cardiomegaly. Vascular congestion. Interstitial thickening may represent edema. No large pleural effusion, left lung base assessment is limited due to soft tissue attenuation. No pneumothorax. IMPRESSION: Chronic cardiomegaly with vascular congestion and possible interstitial edema. Electronically Signed   By: Narda Rutherford M.D.   On: 04/26/2023 20:50    EKG: Independently reviewed. No peaked T waves   Joanna [name], Joanna [name]  Procedures: ECHO COMPLETE BUBBLE STUDY WO IMAGE ENHANCING AGENT  Height: 5\' 2"  (1.575 m)  Weight: 90.9 kg  Blood Pressure: 160/93   Accession Number: [id number]  Date of Study: 01/31/23  Ordering Provider: Dorcas Carrow, MD  Clinical Indications: None Listed     Reading Physicians  Performing Staff   Bensimhon, Bevelyn Buckles, MD   Tech: Janalyn Harder, RDCS     ECHOCARDIOGRAM COMPLETE BUBBLE STUDY (Accession [id number]) [phone] [id number]) Echocardiography Date: 01/30/2023 Department: [facility] Released By: Leda Roys, RDCS Authorizing: Dorcas Carrow, MD   ECHOCARDIOGRAM COMPLETE BUBBLE STUDY Order #: [id number] Accession #: [id number] Patient Info  Patient name: Joanna [name]  MRN: [id number]  Age: 52 y.o.  Sex: female MyChart Results Release  MyChart Status: Code Expired  Results Release   Vitals  BP Height Weight BSA (Calculated - sq m)  142/75 5\' 2"  (1.575 m) 101.7 kg 1.99 sq meters    ECHOCARDIOGRAM COMPLETE BUBBLE STUDY: Patient Communication   Add Comments   Not seen   Order-Level Documents:  Scan on 01/31/2023 12:02 PM by Default, Provider, MD      Study Result      ECHOCARDIOGRAM REPORT       Patient Name:   Joanna  [name]  Date of Exam: 01/31/2023 Medical Rec #:  [id number]  Height:       62.0 in Accession #:    [id number] Weight:       200.4 lb Date of Birth:  [birth year]   BSA:          1.913 m Patient Age:    52 years   BP:           160/93 mmHg Patient Gender: F          HR:           80 bpm. Exam Location:  Inpatient  Procedure: 2D Echo, Cardiac Doppler, Color Doppler, Strain Analysis and Saline            Contrast Bubble Study  Indications:    Stroke   History:        Patient has prior history of Echocardiogram examinations, most                 recent 12/17/2019. CHF, Stroke, Arrythmias:Ventricular                 Fibrillation, Signs/Symptoms:Altered Mental Status; Risk                 Factors:Diabetes and Hypertension. Pericardial effusion.   Sonographer:    Sheralyn Boatman RDCS Referring Phys: [id number] [facility]    Sonographer Comments: Image acquisition challenging due to patient body habitus. IMPRESSIONS    1. Left ventricular ejection fraction, by estimation, is 50 to 55%. The left ventricle has low normal function. The left ventricle has no regional wall motion abnormalities. There is moderate left ventricular hypertrophy. Left ventricular diastolic parameters are consistent with Grade III diastolic dysfunction (restrictive).  2. Right ventricular systolic function is moderately reduced. The right ventricular size is moderately enlarged. Moderately increased right ventricular wall thickness. There is moderately elevated pulmonary artery systolic pressure. The estimated right ventricular systolic pressure is 48.2 mmHg.  3. Left atrial size was severely dilated.  4. Right atrial size was moderately dilated.  5. A small pericardial effusion is present. The pericardial effusion is circumferential. There is no evidence of cardiac tamponade.  6. The mitral valve is degenerative. Mild mitral valve regurgitation. No evidence of mitral stenosis.  7. The aortic valve is tricuspid. Aortic valve regurgitation is trivial. No aortic stenosis is present.  8. The pulmonic valve was doming.  9. The inferior vena cava is dilated in size with <50%  respiratory variability, suggesting right atrial pressure of 15 mmHg. 10. Agitated saline contrast bubble study was positive with shunting observed within 3-6 cardiac cycles suggestive of interatrial shunt.  Conclusion(s)/Recommendation(s): There is significan biventricular hypertrophy with mild to moderate biventricualr dysfunction and a small pericardial effusion. Would recommend further assessment to evalaute for infiltrative cardiomyopathy (i.e. amyloid). Bubble study is very weakl positive.  FINDINGS  Left Ventricle: Left ventricular ejection fraction, by estimation, is 50 to 55%. The left ventricle has low normal function. The left ventricle has no regional wall motion abnormalities. The left ventricular internal cavity size was normal in size. There is moderate left ventricular hypertrophy. Left ventricular diastolic parameters are consistent with Grade III diastolic dysfunction (restrictive).  Right Ventricle: The right ventricular size is moderately enlarged. Moderately increased right ventricular wall thickness. Right ventricular systolic function is moderately reduced. There is moderately elevated pulmonary artery systolic pressure. The tricuspid regurgitant velocity is 2.88 m/s, and with an assumed right atrial pressure of 15 mmHg, the estimated right ventricular systolic pressure is  48.2 mmHg.  Left Atrium: Left atrial size was severely dilated.  Right Atrium: Right atrial size was moderately dilated.  Pericardium: A small pericardial effusion is present. The pericardial effusion is circumferential. There is no evidence of cardiac tamponade.  Mitral Valve: The mitral valve is degenerative in appearance. Mild mitral valve regurgitation. No evidence of mitral valve stenosis. MV peak gradient, 7.1 mmHg. The mean mitral valve gradient is 3.0 mmHg.  Tricuspid Valve: The tricuspid valve is grossly normal. Tricuspid valve regurgitation is mild . No evidence of tricuspid  stenosis.  Aortic Valve: The aortic valve is tricuspid. Aortic valve regurgitation is trivial. No aortic stenosis is present.  Pulmonic Valve: The pulmonic valve was doming in systole. Pulmonic valve regurgitation is mild to moderate. No evidence of pulmonic stenosis.  Aorta: The aortic root and ascending aorta are structurally normal, with no evidence of dilitation.  Venous: The inferior vena cava is dilated in size with less than 50% respiratory variability, suggesting right atrial pressure of 15 mmHg.  IAS/Shunts: There is right bowing of the interatrial septum, suggestive of elevated left atrial pressure. No atrial level shunt detected by color flow Doppler. Agitated saline contrast was given intravenously to evaluate for intracardiac shunting. Agitated saline contrast bubble study was positive with shunting observed within 3-6 cardiac cycles suggestive of interatrial shunt.    LEFT VENTRICLE PLAX 2D LVIDd:         4.70 cm      Diastology LVIDs:         3.35 cm      LV e' medial:    4.65 cm/s LV PW:         1.60 cm      LV E/e' medial:  25.4 LV IVS:        1.45 cm      LV e' lateral:   4.65 cm/s LVOT diam:     2.00 cm      LV E/e' lateral: 25.4 LV SV:         71 LV SV Index:   37 LVOT Area:     3.14 cm   LV Volumes (MOD) LV vol d, MOD A2C: 115.0 ml LV vol d, MOD A4C: 90.6 ml LV vol s, MOD A2C: 54.7 ml LV vol s, MOD A4C: 43.6 ml LV SV MOD A2C:     60.3 ml LV SV MOD A4C:     90.6 ml LV SV MOD BP:      55.1 ml  RIGHT VENTRICLE            IVC RV S prime:     9.68 cm/s  IVC diam: 2.40 cm TAPSE (M-mode): 1.9 cm  LEFT ATRIUM             Index        RIGHT ATRIUM           Index LA diam:        5.10 cm 2.67 cm/m   RA Area:     19.70 cm LA Vol (A2C):   84.0 ml 43.90 ml/m  RA Volume:   53.80 ml  28.12 ml/m LA Vol (A4C):   62.7 ml 32.77 ml/m LA Biplane Vol: 75.9 ml 39.67 ml/m  AORTIC VALVE             PULMONIC VALVE LVOT Vmax:   126.00 cm/s PR End Diast Vel: 2.76  msec LVOT Vmean:  82.700 cm/s LVOT VTI:    0.226 m   AORTA Ao Root  diam: 2.90 cm Ao Asc diam:  3.50 cm  MITRAL VALVE                TRICUSPID VALVE MV Area (PHT): 5.27 cm     TR Peak grad:   33.2 mmHg MV Area VTI:   2.35 cm     TR Vmax:        288.00 cm/s MV Peak grad:  7.1 mmHg MV Mean grad:  3.0 mmHg     SHUNTS MV Vmax:       1.33 m/s     Systemic VTI:  0.23 m MV Vmean:      77.7 cm/s    Systemic Diam: 2.00 cm MV Decel Time: 144 msec MV E velocity: 118.00 cm/s MV A velocity: 56.90 cm/s MV E/A ratio:  2.07  Arvilla Meres MD Electronically signed by Arvilla Meres MD Signature Date/Time: 01/31/2023/12:00:46 PM       Final    Imaging Info  ECHOCARDIOGRAM COMPLETE BUBBLE STUDY (Order #[id number]) on 01/30/23   Study History  ECHOCARDIOGRAM COMPLETE BUBBLE STUDY (Order #[id number]) on 01/30/23   [employer]ng[employer] Images   Show images for ECHOCARDIOGRAM COMPLETE BUBBLE STUDY  Images on [employer]   Show images for Roseanna Rainbow  Performing Technologist/Nurse  Performing Technologist/Nurse: Janalyn Harder, RDCS   Reason for Exam Priority: Routine Not on file           Surgical History  Surgical History  No past medical history on file.    Other Surgical History  No past medical history on file.     Implants  No active implants to display in this view.   Encounter-Level Documents on 01/28/2023:  Scan on 02/05/2023 2:39 PM by Default, Provider, MD  Document on 02/04/2023 2:30 PM by Laurence Compton, RN: IP After Visit Summary  Document on 02/01/2023 10:31 PM by Luanna Cole, RN: ED PB Billing Extract  Electronic signature on 02/01/2023 2:30 PM - E-signed  Scan on 02/03/2023 9:30 AM by Default, Provider, MD  Scan on 02/04/2023 11:07 AM by Default, Provider, MD  Electronic signature on 01/28/2023 12:41 PM - 1 of 2 e-signatures recorded  Scan on 01/28/2023 3:49 PM by Ronney Lion Documents on 01/28/2023:  Scan on  01/31/2023 12:02 PM by Default, Provider, MD  Scan on 01/30/2023 3:12 PM by Default, Provider, MD       Resulted by:  Signed Date/Time  Phone Pager  Arvilla Meres R 01/31/2023 12:00 PM [phone]   External Result Report  External Result Report    ECHOCARDIOGRAM COMPLETE BUBBLE STUDY: Patient Communication   Add Comments   Not seen   Existing Charges  Charge Line Charge Code Status Charge Trigger Charge Type  [id number] Myocrd Strain Img [facility] Myocrd Mech 36644 Deleted Imaging end exam Global  [id number] [facility] Tte 2d Echo Complete [[id number]] 93[address] [facility] Billing Imaging end exam Technical  [id number] Echo Tthrc R-T 2d W/Wom-Mode Compl Spec&Colr D 66063 Deleted Imaging end exam Global  [id number] Echo Tthrc R-T 2d W/Wom-Mode Compl Spec&Colr D [phone] Filed - [employer] Billing Imaging result study Professional      Assessment and Plan: CKD (chronic kidney disease) Per renal note from August 2024 : CKD 4: b/l creat 2.9- 3.7 in early 2024, eGFR 16- 21 ml/min.  Admitted for back pain and admit creatinine 3.9. This likely represents progressive CKD 4 (approaching CKD 5).  CKD is most likely arterionephrosclerosis and diabetic  kidney disease.   At this time monitor intake output and daily weight.  Reacting and appears to be at baseline  Hyperkalemia I will hyperkalemia in the setting of CKD.  No peaked T waves on EKG.  Will give 10 mg of Lokelma now, should response to Lasix use as well.  Recheck again in morning  Anemia Patient is noted to have a hemoglobin of 7.3, prior hemoglobin baseline about 2 months ago was 10.7, has trended down slowly since then.  At this time I do not believe that it is necessarily symptomatic (more symptoms from fluid overload).  Patient has no documented history of coronary artery disease.  And patient is already fluid overloaded.  I think it makes sense to diurese the patient this evening, and then consider a  transfusion in the morning.  This will likely minimize the effects of any volume overload.  Further order an anemia workup and send a type and screen. Check spep. No active bleed suspected.  Anasarca Associated with left pleural effusion, sensation of shortness of breath, and mild hypoxemia noted by EMS and route to the hospital.  Patient has stabilized on 1 to 2 L/min of supplementary oxygen and appears to be stable.  Patient describes having prior episodes of similar leg swelling and shortness of breath.  However last hospitalization in August 2024, does not seem to corroborate this based on documentation review or discharge med rec review.  Patient had a recent echo on file at the time.  See above, concern raised for amyloidosis.  I will check serum SPEP.  I will also check urine protein for creatinine ratio.  Patient may need abdominal fat pad biopsy based on results of above.  In the meantime I will start the patient on Lasix 40 IV twice daily and monitor her electrolytes.  Another potential etiology of fluid overload would be patient's chronic kidney disease that has been progressive.      Advance Care Planning:   Code Status: Prior full code  Consults: none at this juncture.  Family Communication: family was at bedside, updated by ER provider earlier. Can touch base again in the morning.  Severity of Illness: The appropriate patient status for this patient is INPATIENT. Inpatient status is judged to be reasonable and necessary in order to provide the required intensity of service to ensure the patient's safety. The patient's presenting symptoms, physical exam findings, and initial radiographic and laboratory data in the context of their chronic comorbidities is felt to place them at high risk for further clinical deterioration. Furthermore, it is not anticipated that the patient will be medically stable for discharge from the hospital within 2 midnights of admission.   * I certify that at the  point of admission it is my clinical judgment that the patient will require inpatient hospital care spanning beyond 2 midnights from the point of admission due to high intensity of service, high risk for further deterioration and high frequency of surveillance required.*  Author: Nolberto Hanlon, MD 04/26/2023 10:00 PM  For on call review [email or web address].

## 2023-04-26 NOTE — Assessment & Plan Note (Deleted)
Patient is noted to have a hemoglobin of 7.3, prior hemoglobin baseline about 2 months ago was 10.7, has trended down slowly since then.  At this time I do not believe that it is necessarily symptomatic (more symptoms from fluid overload).  Patient has no documented history of coronary artery disease.  And patient is already fluid overloaded.  I think it makes sense to diurese the patient this evening, and then consider a transfusion in the morning.  This will likely minimize the effects of any volume overload.  Further order an anemia workup and send a type and screen. Check spep. No active bleed suspected.

## 2023-04-26 NOTE — Assessment & Plan Note (Signed)
Per renal note from August 2024 : CKD 4: b/l creat 2.9- 3.7 in early 2024, eGFR 16- 21 ml/min.  Admitted for back pain and admit creatinine 3.9. This likely represents progressive CKD 4 (approaching CKD 5).  CKD is most likely arterionephrosclerosis and diabetic kidney disease.   At this time monitor intake output and daily weight.  Reacting and appears to be at baseline

## 2023-04-27 DIAGNOSIS — I5033 Acute on chronic diastolic (congestive) heart failure: Secondary | ICD-10-CM | POA: Diagnosis not present

## 2023-04-27 DIAGNOSIS — J9601 Acute respiratory failure with hypoxia: Secondary | ICD-10-CM | POA: Diagnosis not present

## 2023-04-27 LAB — CBC
HCT: 24.5 % — ABNORMAL LOW (ref 36.0–46.0)
HCT: 24.7 % — ABNORMAL LOW (ref 36.0–46.0)
Hemoglobin: 7.4 g/dL — ABNORMAL LOW (ref 12.0–15.0)
Hemoglobin: 7.4 g/dL — ABNORMAL LOW (ref 12.0–15.0)
MCH: 27.3 pg (ref 26.0–34.0)
MCH: 27.3 pg (ref 26.0–34.0)
MCHC: 30 g/dL (ref 30.0–36.0)
MCHC: 30.2 g/dL (ref 30.0–36.0)
MCV: 90.4 fL (ref 80.0–100.0)
MCV: 91.1 fL (ref 80.0–100.0)
Platelets: 239 10*3/uL (ref 150–400)
Platelets: 243 10*3/uL (ref 150–400)
RBC: 2.71 MIL/uL — ABNORMAL LOW (ref 3.87–5.11)
RBC: 2.71 MIL/uL — ABNORMAL LOW (ref 3.87–5.11)
RDW: 17.1 % — ABNORMAL HIGH (ref 11.5–15.5)
RDW: 17.1 % — ABNORMAL HIGH (ref 11.5–15.5)
WBC: 3.6 10*3/uL — ABNORMAL LOW (ref 4.0–10.5)
WBC: 3.6 10*3/uL — ABNORMAL LOW (ref 4.0–10.5)
nRBC: 0 % (ref 0.0–0.2)
nRBC: 0 % (ref 0.0–0.2)

## 2023-04-27 LAB — HEPATIC FUNCTION PANEL
ALT: 26 U/L (ref 0–44)
AST: 23 U/L (ref 15–41)
Albumin: 2.6 g/dL — ABNORMAL LOW (ref 3.5–5.0)
Alkaline Phosphatase: 59 U/L (ref 38–126)
Bilirubin, Direct: 0.2 mg/dL (ref 0.0–0.2)
Indirect Bilirubin: 0.3 mg/dL (ref 0.3–0.9)
Total Bilirubin: 0.5 mg/dL (ref <1.2)
Total Protein: 5.4 g/dL — ABNORMAL LOW (ref 6.5–8.1)

## 2023-04-27 LAB — RETICULOCYTES
Immature Retic Fract: 29.8 % — ABNORMAL HIGH (ref 2.3–15.9)
RBC.: 2.66 MIL/uL — ABNORMAL LOW (ref 3.87–5.11)
Retic Count, Absolute: 72.6 10*3/uL (ref 19.0–186.0)
Retic Ct Pct: 2.7 % (ref 0.4–3.1)

## 2023-04-27 LAB — PROTEIN / CREATININE RATIO, URINE
Creatinine, Urine: 23 mg/dL
Protein Creatinine Ratio: 2.26 mg/mg{creat} — ABNORMAL HIGH (ref 0.00–0.15)
Total Protein, Urine: 52 mg/dL

## 2023-04-27 LAB — IRON AND TIBC
Iron: 33 ug/dL (ref 28–170)
Saturation Ratios: 10 % — ABNORMAL LOW (ref 10.4–31.8)
TIBC: 322 ug/dL (ref 250–450)
UIBC: 289 ug/dL

## 2023-04-27 LAB — BASIC METABOLIC PANEL
Anion gap: 9 (ref 5–15)
BUN: 45 mg/dL — ABNORMAL HIGH (ref 6–20)
CO2: 21 mmol/L — ABNORMAL LOW (ref 22–32)
Calcium: 8.2 mg/dL — ABNORMAL LOW (ref 8.9–10.3)
Chloride: 109 mmol/L (ref 98–111)
Creatinine, Ser: 3.79 mg/dL — ABNORMAL HIGH (ref 0.44–1.00)
GFR, Estimated: 14 mL/min — ABNORMAL LOW (ref >60)
Glucose, Bld: 102 mg/dL — ABNORMAL HIGH (ref 70–99)
Potassium: 4.7 mmol/L (ref 3.5–5.1)
Sodium: 139 mmol/L (ref 135–145)

## 2023-04-27 LAB — APTT: aPTT: 36 s (ref 24–36)

## 2023-04-27 LAB — FOLATE: Folate: 10.9 ng/mL (ref >5.9)

## 2023-04-27 LAB — VITAMIN B12: Vitamin B-12: 355 pg/mL (ref 180–914)

## 2023-04-27 LAB — CREATININE, SERUM
Creatinine, Ser: 3.78 mg/dL — ABNORMAL HIGH (ref 0.44–1.00)
GFR, Estimated: 14 mL/min — ABNORMAL LOW (ref >60)

## 2023-04-27 LAB — PROTIME-INR
INR: 1.3 — ABNORMAL HIGH (ref 0.8–1.2)
Prothrombin Time: 16.2 s — ABNORMAL HIGH (ref 11.4–15.2)

## 2023-04-27 LAB — PHOSPHORUS: Phosphorus: 4.1 mg/dL (ref 2.5–4.6)

## 2023-04-27 LAB — FERRITIN: Ferritin: 37 ng/mL (ref 11–307)

## 2023-04-27 MED ORDER — AMLODIPINE BESYLATE 10 MG PO TABS
10.0000 mg | ORAL_TABLET | Freq: Every day | ORAL | Status: DC
  Administered 2023-04-27 – 2023-05-03 (×7): 10 mg via ORAL
  Filled 2023-04-27: qty 2
  Filled 2023-04-27 (×6): qty 1

## 2023-04-27 MED ORDER — CARVEDILOL 12.5 MG PO TABS
12.5000 mg | ORAL_TABLET | Freq: Two times a day (BID) | ORAL | Status: DC
  Administered 2023-04-27 – 2023-05-03 (×13): 12.5 mg via ORAL
  Filled 2023-04-27 (×13): qty 1

## 2023-04-27 MED ORDER — ATORVASTATIN CALCIUM 40 MG PO TABS
40.0000 mg | ORAL_TABLET | Freq: Every day | ORAL | Status: DC
  Administered 2023-04-27 – 2023-05-03 (×7): 40 mg via ORAL
  Filled 2023-04-27 (×7): qty 1

## 2023-04-27 MED ORDER — ISOSORBIDE MONONITRATE ER 60 MG PO TB24
120.0000 mg | ORAL_TABLET | Freq: Every day | ORAL | Status: DC
  Administered 2023-04-27 – 2023-05-03 (×7): 120 mg via ORAL
  Filled 2023-04-27 (×6): qty 2
  Filled 2023-04-27: qty 4

## 2023-04-27 MED ORDER — SODIUM BICARBONATE 650 MG PO TABS
1300.0000 mg | ORAL_TABLET | Freq: Two times a day (BID) | ORAL | Status: DC
  Administered 2023-04-27 – 2023-04-28 (×4): 1300 mg via ORAL
  Filled 2023-04-27 (×4): qty 2

## 2023-04-27 MED ORDER — HYDRALAZINE HCL 50 MG PO TABS
100.0000 mg | ORAL_TABLET | Freq: Three times a day (TID) | ORAL | Status: DC
  Administered 2023-04-27 – 2023-04-28 (×2): 100 mg via ORAL
  Filled 2023-04-27 (×5): qty 2

## 2023-04-27 MED ORDER — ASPIRIN 81 MG PO TBEC
81.0000 mg | DELAYED_RELEASE_TABLET | Freq: Every day | ORAL | Status: DC
  Administered 2023-04-27 – 2023-05-03 (×7): 81 mg via ORAL
  Filled 2023-04-27 (×7): qty 1

## 2023-04-27 NOTE — Progress Notes (Signed)
Patient's sister [name] successfully notified of patient's transfer from ED to [facility]. Per sister, she would like to see if home health services could be arranged.

## 2023-04-27 NOTE — ED Notes (Signed)
ED TO INPATIENT HANDOFF REPORT  ED Nurse Name and Phone #: Deroy Noah [phone]  S Name/Age/Gender Joanna [name] 52 y.o. female Room/Bed: 038C/038C  Code Status   Code Status: Full Code  Home/SNF/Other Home Patient oriented to: self, place, time, and situation Is this baseline? Yes   Triage Complete: Triage complete  Chief Complaint Anasarca [R60.1]  Triage Note Pt arrives via ems to the er for the c/o sob x 1week, worsened the past couple days. Pt was 88% with ems, placed on 4l via [place] for 100% 02. Hx chf, bp and hr stable per ems   Allergies No Known Allergies  Level of Care/Admitting Diagnosis ED Disposition     ED Disposition  Admit   Condition  --   Comment  Hospital [facility]: [facility] [[id number]]  Level of Care: Telemetry Medical [[id number]]  May admit patient to [facility] or [facility] if equivalent level of care is available:: No  Covid Evaluation: Asymptomatic - no recent exposure (last 10 days) testing not required  Diagnosis: Anasarca [[id number]]  Admitting Physician: Nolberto Hanlon [[id number]]  Attending Physician: Nolberto Hanlon [[id number]]  Certification:: I certify this patient will need inpatient services for at least 2 midnights  Expected Medical Readiness: 04/29/2023          B Medical/Surgery History Past Medical History:  Diagnosis Date   Acute diastolic heart failure (HCC) 12/16/2019   Acute respiratory failure with hypoxia (HCC) 12/16/2019   AKI (acute kidney injury) (HCC) 12/16/2019   Anasarca 12/16/2019   CAD (coronary artery disease)    Diabetes mellitus without complication (HCC)    HTN (hypertension) 12/16/2019   Hypertension    Hypertensive urgency 12/16/2019   Intellectual disability 12/16/2019   Morbid obesity (HCC)    New onset of congestive heart failure (HCC) 12/16/2019   Obesity, Class III, BMI 40-49.9 (morbid obesity) (HCC) 12/16/2019   Type 2 diabetes mellitus without complication (HCC) 12/16/2019   History reviewed. No pertinent  surgical history.   A IV Location/Drains/Wounds Patient Lines/Drains/Airways Status     Active Line/Drains/Airways     Name Placement date Placement time Site Days   Peripheral IV 04/26/23 20 G 1.88" Right;Upper Arm 04/26/23  2051  Arm  1            Intake/Output Last 24 hours  Intake/Output Summary (Last 24 hours) at 04/27/2023 1241 Last data filed at 04/27/2023 0032 Gross per 24 hour  Intake 45 ml  Output 300 ml  Net -255 ml    Labs/Imaging Results for orders placed or performed during the hospital encounter of 04/26/23 (from the past 48 hour(s))  CBC with Differential     Status: Abnormal   Collection Time: 04/26/23  5:20 PM  Result Value Ref Range   WBC 4.2 4.0 - 10.5 K/uL   RBC 2.70 (L) 3.87 - 5.11 MIL/uL   Hemoglobin 7.3 (L) 12.0 - 15.0 g/dL   HCT 84.1 (L) 66.0 - 63.0 %   MCV 91.5 80.0 - 100.0 fL   MCH 27.0 26.0 - 34.0 pg   MCHC 29.6 (L) 30.0 - 36.0 g/dL   RDW 16.0 (H) 10.9 - 32.3 %   Platelets 242 150 - 400 K/uL   nRBC 0.0 0.0 - 0.2 %   Neutrophils Relative % 70 %   Neutro Abs 2.9 1.7 - 7.7 K/uL   Lymphocytes Relative 13 %   Lymphs Abs 0.5 (L) 0.7 - 4.0 K/uL   Monocytes Relative 12 %   Monocytes Absolute 0.5  0.1 - 1.0 K/uL   Eosinophils Relative 3 %   Eosinophils Absolute 0.1 0.0 - 0.5 K/uL   Basophils Relative 1 %   Basophils Absolute 0.1 0.0 - 0.1 K/uL   Immature Granulocytes 1 %   Abs Immature Granulocytes 0.02 0.00 - 0.07 K/uL    Comment: Performed at [facility], [address] [place], [place] [address]  Basic metabolic panel     Status: Abnormal   Collection Time: 04/26/23  5:20 PM  Result Value Ref Range   Sodium 137 135 - 145 mmol/L   Potassium 5.2 (H) 3.5 - 5.1 mmol/L   Chloride 109 98 - 111 mmol/L   CO2 20 (L) 22 - 32 mmol/L   Glucose, Bld 83 70 - 99 mg/dL    Comment: Glucose reference range applies only to samples taken after fasting for at least 8 hours.   BUN 46 (H) 6 - 20 mg/dL   Creatinine, Ser 0.98 (H) 0.44 - 1.00 mg/dL    Calcium 8.5 (L) 8.9 - 10.3 mg/dL   GFR, Estimated 14 (L) >60 mL/min    Comment: (NOTE) Calculated using the CKD-EPI Creatinine Equation (2021)    Anion gap 8 5 - 15    Comment: Performed at [facility], [address] [place], [place] [address]  Troponin I (High Sensitivity)     Status: None   Collection Time: 04/26/23  5:20 PM  Result Value Ref Range   Troponin I (High Sensitivity) 14 <18 ng/L    Comment: (NOTE) Elevated high sensitivity troponin I (hsTnI) values and significant  changes across serial measurements may suggest ACS but many other  chronic and acute conditions are known to elevate hsTnI results.  Refer to the "Links" section for chest pain algorithms and additional  guidance. Performed at [facility], [address] [place], [place] [address]   Brain natriuretic peptide     Status: Abnormal   Collection Time: 04/26/23  6:00 PM  Result Value Ref Range   B Natriuretic Peptide 1,342.9 (H) 0.0 - 100.0 pg/mL    Comment: Performed at [facility], [address] [place], [place] [address]  ABO/Rh     Status: None   Collection Time: 04/26/23  6:08 PM  Result Value Ref Range   ABO/RH(D)      A POS Performed at [facility], [address], [place], [place] [address]   Troponin I (High Sensitivity)     Status: None   Collection Time: 04/26/23  9:03 PM  Result Value Ref Range   Troponin I (High Sensitivity) 14 <18 ng/L    Comment: (NOTE) Elevated high sensitivity troponin I (hsTnI) values and significant  changes across serial measurements may suggest ACS but many other  chronic and acute conditions are known to elevate hsTnI results.  Refer to the "Links" section for chest pain algorithms and additional  guidance. Performed at [facility], [address], [place], [place] [address]   Vitamin B12     Status: None   Collection Time: 04/26/23 11:45 PM  Result Value Ref Range   Vitamin B-12 355 180 - 914 pg/mL    Comment:  (NOTE) This assay is not validated for testing neonatal or myeloproliferative syndrome specimens for Vitamin B12 levels. Performed at [facility], [address] [place], [place] [address]   Folate     Status: None   Collection Time: 04/26/23 11:45 PM  Result  Value Ref Range   Folate 10.9 >5.9 ng/mL    Comment: Performed at [facility], [address], [place], [place] [address]  Iron and TIBC     Status: Abnormal   Collection Time: 04/26/23 11:45 PM  Result Value Ref Range   Iron 33 28 - 170 ug/dL   TIBC 191 478 - 295 ug/dL   Saturation Ratios 10 (L) 10.4 - 31.8 %   UIBC 289 ug/dL    Comment: Performed at [facility], [address], [place], [place] [address]  Ferritin     Status: None   Collection Time: 04/26/23 11:45 PM  Result Value Ref Range   Ferritin 37 11 - 307 ng/mL    Comment: Performed at [facility], [address] [place], [place] [address]  Reticulocytes     Status: Abnormal   Collection Time: 04/26/23 11:45 PM  Result Value Ref Range   Retic Ct Pct 2.7 0.4 - 3.1 %   RBC. 2.66 (L) 3.87 - 5.11 MIL/uL   Retic Count, Absolute 72.6 19.0 - 186.0 K/uL   Immature Retic Fract 29.8 (H) 2.3 - 15.9 %    Comment: Performed at [facility], [address] [place], [place] [address]  CBC     Status: Abnormal   Collection Time: 04/26/23 11:45 PM  Result Value Ref Range   WBC 3.6 (L) 4.0 - 10.5 K/uL   RBC 2.71 (L) 3.87 - 5.11 MIL/uL   Hemoglobin 7.4 (L) 12.0 - 15.0 g/dL   HCT 95.2 (L) 84.1 - 32.4 %   MCV 90.4 80.0 - 100.0 fL   MCH 27.3 26.0 - 34.0 pg   MCHC 30.2 30.0 - 36.0 g/dL   RDW 40.1 (H) 02.7 - 25.3 %   Platelets 243 150 - 400 K/uL   nRBC 0.0 0.0 - 0.2 %    Comment: Performed at [facility], [address] [place], [place] [address]  Creatinine, serum     Status: Abnormal   Collection Time: 04/26/23 11:45 PM  Result Value Ref Range   Creatinine, Ser 3.78 (H) 0.44 - 1.00 mg/dL   GFR, Estimated 14 (L) >60 mL/min     Comment: (NOTE) Calculated using the CKD-EPI Creatinine Equation (2021) Performed at [facility], [address], [place], [place] [address]   Protein / creatinine ratio, urine     Status: Abnormal   Collection Time: 04/27/23 12:33 AM  Result Value Ref Range   Creatinine, Urine 23 mg/dL   Total Protein, Urine 52 mg/dL    Comment: NO NORMAL RANGE ESTABLISHED FOR THIS TEST   Protein Creatinine Ratio 2.26 (H) 0.00 - 0.15 mg/mg[Cre]    Comment: Performed at [facility], [address], [place], [place] [address]  Type and screen [facility]     Status: None   Collection Time: 04/27/23  1:18 AM  Result Value Ref Range   ABO/RH(D) A POS    Antibody Screen NEG    Sample Expiration      04/30/2023,2359 Performed at [facility], [address], [place], [place] [address]   Hepatic function panel     Status: Abnormal   Collection Time: 04/27/23  1:18 AM  Result Value Ref Range   Total Protein 5.4 (L) 6.5 - 8.1 g/dL   Albumin 2.6 (L) 3.5 - 5.0 g/dL   AST 23 15 - 41 U/L   ALT 26 0 -  44 U/L   Alkaline Phosphatase 59 38 - 126 U/L   Total Bilirubin 0.5 <1.2 mg/dL   Bilirubin, Direct 0.2 0.0 - 0.2 mg/dL   Indirect Bilirubin 0.3 0.3 - 0.9 mg/dL    Comment: Performed at [facility], [address] [place], [place] [address]  APTT     Status: None   Collection Time: 04/27/23  1:18 AM  Result Value Ref Range   aPTT 36 24 - 36 seconds    Comment: Performed at [facility], [address] [place], [place] [address]  Protime-INR     Status: Abnormal   Collection Time: 04/27/23  1:18 AM  Result Value Ref Range   Prothrombin Time 16.2 (H) 11.4 - 15.2 seconds   INR 1.3 (H) 0.8 - 1.2    Comment: (NOTE) INR goal varies based on device and disease states. Performed at [facility], [address] [place], [place] [address]   Phosphorus     Status: None   Collection Time: 04/27/23  1:18 AM  Result Value Ref Range   Phosphorus 4.1 2.5 -  4.6 mg/dL    Comment: Performed at [facility], [address], [place], [place] [address]  Basic metabolic panel     Status: Abnormal   Collection Time: 04/27/23  1:18 AM  Result Value Ref Range   Sodium 139 135 - 145 mmol/L   Potassium 4.7 3.5 - 5.1 mmol/L   Chloride 109 98 - 111 mmol/L   CO2 21 (L) 22 - 32 mmol/L   Glucose, Bld 102 (H) 70 - 99 mg/dL    Comment: Glucose reference range applies only to samples taken after fasting for at least 8 hours.   BUN 45 (H) 6 - 20 mg/dL   Creatinine, Ser 8.46 (H) 0.44 - 1.00 mg/dL   Calcium 8.2 (L) 8.9 - 10.3 mg/dL   GFR, Estimated 14 (L) >60 mL/min    Comment: (NOTE) Calculated using the CKD-EPI Creatinine Equation (2021)    Anion gap 9 5 - 15    Comment: Performed at [facility], [address] [place], [place] [address]  CBC     Status: Abnormal   Collection Time: 04/27/23  1:18 AM  Result Value Ref Range   WBC 3.6 (L) 4.0 - 10.5 K/uL   RBC 2.71 (L) 3.87 - 5.11 MIL/uL   Hemoglobin 7.4 (L) 12.0 - 15.0 g/dL   HCT 28.4 (L) 13.2 - 44.0 %   MCV 91.1 80.0 - 100.0 fL   MCH 27.3 26.0 - 34.0 pg   MCHC 30.0 30.0 - 36.0 g/dL   RDW 10.2 (H) 72.5 - 36.6 %   Platelets 239 150 - 400 K/uL   nRBC 0.0 0.0 - 0.2 %    Comment: Performed at [facility], [address], [place], [place] [address]   DG Chest Port 1 View  Result Date: 04/26/2023 CLINICAL DATA:  Shortness of breath. EXAM: PORTABLE CHEST 1 VIEW COMPARISON:  12/16/2019 FINDINGS: Chronic cardiomegaly. Vascular congestion. Interstitial thickening may represent edema. No large pleural effusion, left lung base assessment is limited due to soft tissue attenuation. No pneumothorax. IMPRESSION: Chronic cardiomegaly with vascular congestion and possible interstitial edema. Electronically Signed   By: Narda Rutherford M.D.   On: 04/26/2023 20:50    Pending Labs Unresulted Labs (From admission, onward)     Start     Ordered   04/26/23 2207  Haptoglobin  Once,  URGENT         04/26/23 2206   04/26/23 2207  Protein electrophoresis, serum  Once,   URGENT        04/26/23 2206            Vitals/Pain Today's Vitals   04/27/23 0915 04/27/23 0959 04/27/23 1100 04/27/23 1200  BP: 137/70 137/70  (!) 112/57  Pulse: 73 70  67  Resp: 16 17  20   Temp:   97.7 F (36.5 C)   TempSrc:   Oral   SpO2: 100% 100%  100%  Weight:      Height:      PainSc:        Isolation Precautions No active isolations  Medications Medications  furosemide (LASIX) injection 40 mg (40 mg Intravenous Given 04/27/23 1047)  enoxaparin (LOVENOX) injection 40 mg (40 mg Subcutaneous Given 04/27/23 1048)  acetaminophen (TYLENOL) tablet 650 mg (has no administration in time range)    Or  acetaminophen (TYLENOL) suppository 650 mg (has no administration in time range)  polyethylene glycol (MIRALAX / GLYCOLAX) packet 17 g (has no administration in time range)  sodium chloride flush (NS) 0.9 % injection 3 mL (3 mLs Intravenous Given 04/27/23 1048)  aspirin EC tablet 81 mg (81 mg Oral Given 04/27/23 1048)  sodium bicarbonate tablet 1,300 mg (1,300 mg Oral Given 04/27/23 1048)  isosorbide mononitrate (IMDUR) 24 hr tablet 120 mg (120 mg Oral Given 04/27/23 1049)  atorvastatin (LIPITOR) tablet 40 mg (40 mg Oral Given 04/27/23 1049)  hydrALAZINE (APRESOLINE) tablet 100 mg (100 mg Oral Not Given 04/27/23 0650)  carvedilol (COREG) tablet 12.5 mg (12.5 mg Oral Given 04/27/23 1048)  amLODipine (NORVASC) tablet 10 mg (10 mg Oral Given 04/27/23 1049)  furosemide (LASIX) injection 40 mg (40 mg Intravenous Given 04/26/23 2119)  sodium zirconium cyclosilicate (LOKELMA) packet 10 g (10 g Oral Given 04/26/23 2346)    Mobility non-ambulatory     Focused Assessments Cardiac Assessment Handoff:  Cardiac Rhythm: Normal sinus rhythm No results found for: "CKTOTAL", "CKMB", "CKMBINDEX", "TROPONINI" No results found for: "DDIMER" Does the Patient currently have chest pain? No    R Recommendations: See Admitting  Provider Note  Report given to:   Additional Notes:

## 2023-04-27 NOTE — Assessment & Plan Note (Signed)
-   due to volume overload - continue O2 and wean as able  -Needs walk test prior to discharge

## 2023-04-27 NOTE — Assessment & Plan Note (Signed)
Glucose has been controlled, currently off insulin therapy.

## 2023-04-27 NOTE — Progress Notes (Signed)
Progress Note    Joanna [name]   WVP:[id number]  DOB: 03-22-1971  DOA: 04/26/2023     1 PCP: Hillery Aldo, NP  Initial CC: SOB, LE edema  Hospital Course: Maille Scheuring is a 52 y.o. female with PMH IDD, dCHF, CKD who presented with worsening LE edema and SOB.  She had developed worsening symptoms after holding Lasix at home and not resuming.  On workup she was also found to be hypoxic at 88% on room air and was placed on oxygen.   Patient last hospitalization seems to have ended on February 04, 2023 when she was treated for hypertensive urgency as well as acute right MCA ischemic stroke.   She was admitted for diuresis and monitoring of renal function while resuming.  Interval History:  Seen in ER, more comfortable and breathing easier.  Voiding well with Lasix.  Assessment and Plan: * Acute on chronic diastolic CHF (congestive heart failure) (HCC) - Presented with worsening shortness of breath, lower extremity edema; has pulmonary crackles and CXR noted with cardiomegaly and interstitial edema -BNP elevated 1,342 -Was holding Lasix from worsened renal function at home but did not resume; suspect has developed worsening edema due to this - Continue IV Lasix and monitoring output  Acute respiratory failure with hypoxia (HCC) - due to volume overload - continue O2 and wean as able   CKD (chronic kidney disease) stage 4, GFR 15-29 ml/min (HCC) - Per renal note from August 2024 : CKD 4: b/l creat 2.9- 3.7 in early 2024, eGFR 16- 21 ml/min.  Admitted for back pain and admit creatinine 3.9. This likely represents progressive CKD 4 (approaching CKD 5).  CKD is most likely arterionephrosclerosis and diabetic kidney disease.  -Continue trending renal function and output on diuresis  Hyperkalemia - 5.2 on admission - has improved with diuresis  Anemia - Baseline around 8 to 9 g/dL - No overt signs of bleeding -Continue trending for now and will transfuse if necessary  Intellectual  disability - noted; at baseline  Type 2 diabetes mellitus without complication (HCC) - Last A1c 6% on 01/29/2023 - Continue diet control  HTN (hypertension) - Continue amlodipine, Coreg, hydralazine, Imdur   Old records reviewed in assessment of this patient  Antimicrobials:   DVT prophylaxis:  enoxaparin (LOVENOX) injection 40 mg Start: 04/27/23 0800 SCDs Start: 04/26/23 2220   Code Status:   Code Status: Full Code  Mobility Assessment (Last 72 Hours)     Mobility Assessment     Row Name 04/27/23 04:13:05           Does patient have an order for bedrest or is patient medically unstable No - Continue assessment       What is the highest level of mobility based on the progressive mobility assessment? Level 3 (Stands with assist) - Balance while standing  and cannot march in place                Barriers to discharge: none Disposition Plan:  Home Status is: Inpt  Objective: Blood pressure (!) 112/57, pulse 67, temperature 97.7 F (36.5 C), temperature source Oral, resp. rate 20, height 5\' 2"  (1.575 m), weight 101.7 kg, SpO2 100%.  Examination:  Physical Exam Constitutional:      Appearance: Normal appearance.  HENT:     Head: Normocephalic and atraumatic.     Mouth/Throat:     Mouth: Mucous membranes are moist.  Eyes:     Extraocular Movements: Extraocular movements intact.  Cardiovascular:  Rate and Rhythm: Normal rate and regular rhythm.  Pulmonary:     Effort: Pulmonary effort is normal. No respiratory distress.     Breath sounds: Normal breath sounds. No wheezing.  Abdominal:     General: Bowel sounds are normal. There is no distension.     Palpations: Abdomen is soft.     Tenderness: There is no abdominal tenderness.  Musculoskeletal:        General: Swelling (3+ LE edema) present. Normal range of motion.     Cervical back: Normal range of motion and neck supple.  Skin:    General: Skin is warm and dry.  Neurological:     General: No focal  deficit present.     Mental Status: She is alert.  Psychiatric:        Mood and Affect: Mood normal.      Consultants:    Procedures:    Data Reviewed: Results for orders placed or performed during the hospital encounter of 04/26/23 (from the past 24 hour(s))  CBC with Differential     Status: Abnormal   Collection Time: 04/26/23  5:20 PM  Result Value Ref Range   WBC 4.2 4.0 - 10.5 K/uL   RBC 2.70 (L) 3.87 - 5.11 MIL/uL   Hemoglobin 7.3 (L) 12.0 - 15.0 g/dL   HCT 57.8 (L) 46.9 - 62.9 %   MCV 91.5 80.0 - 100.0 fL   MCH 27.0 26.0 - 34.0 pg   MCHC 29.6 (L) 30.0 - 36.0 g/dL   RDW 52.8 (H) 41.3 - 24.4 %   Platelets 242 150 - 400 K/uL   nRBC 0.0 0.0 - 0.2 %   Neutrophils Relative % 70 %   Neutro Abs 2.9 1.7 - 7.7 K/uL   Lymphocytes Relative 13 %   Lymphs Abs 0.5 (L) 0.7 - 4.0 K/uL   Monocytes Relative 12 %   Monocytes Absolute 0.5 0.1 - 1.0 K/uL   Eosinophils Relative 3 %   Eosinophils Absolute 0.1 0.0 - 0.5 K/uL   Basophils Relative 1 %   Basophils Absolute 0.1 0.0 - 0.1 K/uL   Immature Granulocytes 1 %   Abs Immature Granulocytes 0.02 0.00 - 0.07 K/uL  Basic metabolic panel     Status: Abnormal   Collection Time: 04/26/23  5:20 PM  Result Value Ref Range   Sodium 137 135 - 145 mmol/L   Potassium 5.2 (H) 3.5 - 5.1 mmol/L   Chloride 109 98 - 111 mmol/L   CO2 20 (L) 22 - 32 mmol/L   Glucose, Bld 83 70 - 99 mg/dL   BUN 46 (H) 6 - 20 mg/dL   Creatinine, Ser 0.10 (H) 0.44 - 1.00 mg/dL   Calcium 8.5 (L) 8.9 - 10.3 mg/dL   GFR, Estimated 14 (L) >60 mL/min   Anion gap 8 5 - 15  Troponin I (High Sensitivity)     Status: None   Collection Time: 04/26/23  5:20 PM  Result Value Ref Range   Troponin I (High Sensitivity) 14 <18 ng/L  Brain natriuretic peptide     Status: Abnormal   Collection Time: 04/26/23  6:00 PM  Result Value Ref Range   B Natriuretic Peptide 1,342.9 (H) 0.0 - 100.0 pg/mL  ABO/Rh     Status: None   Collection Time: 04/26/23  6:08 PM  Result Value Ref  Range   ABO/RH(D)      A POS Performed at [facility], [address] [place], [place] [address]  Troponin I (High Sensitivity)     Status: None   Collection Time: 04/26/23  9:03 PM  Result Value Ref Range   Troponin I (High Sensitivity) 14 <18 ng/L  Vitamin B12     Status: None   Collection Time: 04/26/23 11:45 PM  Result Value Ref Range   Vitamin B-12 355 180 - 914 pg/mL  Folate     Status: None   Collection Time: 04/26/23 11:45 PM  Result Value Ref Range   Folate 10.9 >5.9 ng/mL  Iron and TIBC     Status: Abnormal   Collection Time: 04/26/23 11:45 PM  Result Value Ref Range   Iron 33 28 - 170 ug/dL   TIBC 161 096 - 045 ug/dL   Saturation Ratios 10 (L) 10.4 - 31.8 %   UIBC 289 ug/dL  Ferritin     Status: None   Collection Time: 04/26/23 11:45 PM  Result Value Ref Range   Ferritin 37 11 - 307 ng/mL  Reticulocytes     Status: Abnormal   Collection Time: 04/26/23 11:45 PM  Result Value Ref Range   Retic Ct Pct 2.7 0.4 - 3.1 %   RBC. 2.66 (L) 3.87 - 5.11 MIL/uL   Retic Count, Absolute 72.6 19.0 - 186.0 K/uL   Immature Retic Fract 29.8 (H) 2.3 - 15.9 %  CBC     Status: Abnormal   Collection Time: 04/26/23 11:45 PM  Result Value Ref Range   WBC 3.6 (L) 4.0 - 10.5 K/uL   RBC 2.71 (L) 3.87 - 5.11 MIL/uL   Hemoglobin 7.4 (L) 12.0 - 15.0 g/dL   HCT 40.9 (L) 81.1 - 91.4 %   MCV 90.4 80.0 - 100.0 fL   MCH 27.3 26.0 - 34.0 pg   MCHC 30.2 30.0 - 36.0 g/dL   RDW 78.2 (H) 95.6 - 21.3 %   Platelets 243 150 - 400 K/uL   nRBC 0.0 0.0 - 0.2 %  Creatinine, serum     Status: Abnormal   Collection Time: 04/26/23 11:45 PM  Result Value Ref Range   Creatinine, Ser 3.78 (H) 0.44 - 1.00 mg/dL   GFR, Estimated 14 (L) >60 mL/min  Protein / creatinine ratio, urine     Status: Abnormal   Collection Time: 04/27/23 12:33 AM  Result Value Ref Range   Creatinine, Urine 23 mg/dL   Total Protein, Urine 52 mg/dL   Protein Creatinine Ratio 2.26 (H) 0.00 - 0.15 mg/mg[Cre]  Type and  screen [facility]     Status: None   Collection Time: 04/27/23  1:18 AM  Result Value Ref Range   ABO/RH(D) A POS    Antibody Screen NEG    Sample Expiration      04/30/2023,2359 Performed at [facility], [address], [place], [place] [address]   Hepatic function panel     Status: Abnormal   Collection Time: 04/27/23  1:18 AM  Result Value Ref Range   Total Protein 5.4 (L) 6.5 - 8.1 g/dL   Albumin 2.6 (L) 3.5 - 5.0 g/dL   AST 23 15 - 41 U/L   ALT 26 0 - 44 U/L   Alkaline Phosphatase 59 38 - 126 U/L   Total Bilirubin 0.5 <1.2 mg/dL   Bilirubin, Direct 0.2 0.0 - 0.2 mg/dL   Indirect Bilirubin 0.3 0.3 - 0.9 mg/dL  APTT     Status: None   Collection Time: 04/27/23  1:18 AM  Result Value Ref Range  aPTT 36 24 - 36 seconds  Protime-INR     Status: Abnormal   Collection Time: 04/27/23  1:18 AM  Result Value Ref Range   Prothrombin Time 16.2 (H) 11.4 - 15.2 seconds   INR 1.3 (H) 0.8 - 1.2  Phosphorus     Status: None   Collection Time: 04/27/23  1:18 AM  Result Value Ref Range   Phosphorus 4.1 2.5 - 4.6 mg/dL  Basic metabolic panel     Status: Abnormal   Collection Time: 04/27/23  1:18 AM  Result Value Ref Range   Sodium 139 135 - 145 mmol/L   Potassium 4.7 3.5 - 5.1 mmol/L   Chloride 109 98 - 111 mmol/L   CO2 21 (L) 22 - 32 mmol/L   Glucose, Bld 102 (H) 70 - 99 mg/dL   BUN 45 (H) 6 - 20 mg/dL   Creatinine, Ser 9.56 (H) 0.44 - 1.00 mg/dL   Calcium 8.2 (L) 8.9 - 10.3 mg/dL   GFR, Estimated 14 (L) >60 mL/min   Anion gap 9 5 - 15  CBC     Status: Abnormal   Collection Time: 04/27/23  1:18 AM  Result Value Ref Range   WBC 3.6 (L) 4.0 - 10.5 K/uL   RBC 2.71 (L) 3.87 - 5.11 MIL/uL   Hemoglobin 7.4 (L) 12.0 - 15.0 g/dL   HCT 21.3 (L) 08.6 - 57.8 %   MCV 91.1 80.0 - 100.0 fL   MCH 27.3 26.0 - 34.0 pg   MCHC 30.0 30.0 - 36.0 g/dL   RDW 46.9 (H) 62.9 - 52.8 %   Platelets 239 150 - 400 K/uL   nRBC 0.0 0.0 - 0.2 %    I have reviewed pertinent  nursing notes, vitals, labs, and images as necessary. I have ordered labwork to follow up on as indicated.  I have reviewed the last notes from staff over past 24 hours. I have discussed patient's care plan and test results with nursing staff, CM/SW, and other staff as appropriate.  Time spent: Greater than 50% of the 55 minute visit was spent in counseling/coordination of care for the patient as laid out in the A&P.   LOS: 1 day   Lewie Chamber, MD [facility] 04/27/2023, 12:45 PM

## 2023-04-27 NOTE — Plan of Care (Signed)

## 2023-04-27 NOTE — Progress Notes (Signed)
Unable to attain standing weight upon admission. Patient transferred from stretcher to hospital bed. Patient reported that she walks without any assistance at baseline.

## 2023-04-27 NOTE — Assessment & Plan Note (Signed)
-   noted; at baseline - [name] the nephew listed in epic appears to be the [facility] and sounds like the POA; [name] listed as sister is not actually her sister but someone patient calls her sister

## 2023-04-27 NOTE — Hospital Course (Addendum)
Joanna [name] was admitted to the hospital with the working diagnosis of acute on chronic diastolic heart failure decompensation in the setting of advance CKD.   52 y.o. female with past medical history of T2DM, hypertension, obesity, CKD and cognitive impairment who presented with worsening LE edema and dyspnea.  Recent hospitalization 01/2023 for hypertensive urgency as well as acute right MCA ischemic stroke.  Reported worsening dyspnea and lower extremity edema for the last 7 to 10 days prior to admission. EMS was called and she was found with 02 saturation 88% on room air, she was placed on supplemental 02 and was transported to the ED.  On her initial physical examination her blood pressure was 153/102, HR 77, RR 22 and 02 saturation 99% on supplemental 02.  Lungs with bilateral rales, heart with S1 and S2 present and rhythmic, abdomen with no distention and positive lower extremity edema.   Na 137, K 5.2 CL 109, bicarbonate 20, glucose 83, bun 46 and cr 3,71  BNP 1,342  High sensitive troponin 14 and 14  Wbc 4,2 hgb 7,3 plt 242   Chest radiograph with left rotation, cardiomegaly and bilateral hilar vascular congestion.   EKG 80 bpm, normal axis, normal intervals, sinus rhythm with 1st degree AV block, with no significant ST segment or T wave changes.   Patient has placed on IV furosemide and then transitioned to oral torsemide.   11/15 volume status has improved and renal function is more stable.  Will need close follow up as outpatient.

## 2023-04-27 NOTE — Assessment & Plan Note (Signed)
Continue blood pressure control with carvedilol, isosorbide and amlodipine.

## 2023-04-28 DIAGNOSIS — E875 Hyperkalemia: Secondary | ICD-10-CM | POA: Diagnosis not present

## 2023-04-28 DIAGNOSIS — D631 Anemia in chronic kidney disease: Secondary | ICD-10-CM

## 2023-04-28 DIAGNOSIS — F79 Unspecified intellectual disabilities: Secondary | ICD-10-CM

## 2023-04-28 DIAGNOSIS — I5033 Acute on chronic diastolic (congestive) heart failure: Secondary | ICD-10-CM | POA: Diagnosis not present

## 2023-04-28 DIAGNOSIS — N184 Chronic kidney disease, stage 4 (severe): Secondary | ICD-10-CM | POA: Diagnosis not present

## 2023-04-28 DIAGNOSIS — J9601 Acute respiratory failure with hypoxia: Secondary | ICD-10-CM | POA: Diagnosis not present

## 2023-04-28 LAB — IRON AND TIBC
Iron: 28 ug/dL (ref 28–170)
Saturation Ratios: 9 % — ABNORMAL LOW (ref 10.4–31.8)
TIBC: 312 ug/dL (ref 250–450)
UIBC: 284 ug/dL

## 2023-04-28 LAB — URINALYSIS, ROUTINE W REFLEX MICROSCOPIC
Bilirubin Urine: NEGATIVE
Glucose, UA: NEGATIVE mg/dL
Hgb urine dipstick: NEGATIVE
Ketones, ur: NEGATIVE mg/dL
Leukocytes,Ua: NEGATIVE
Nitrite: NEGATIVE
Protein, ur: 100 mg/dL — AB
Specific Gravity, Urine: 1.01 (ref 1.005–1.030)
pH: 6 (ref 5.0–8.0)

## 2023-04-28 LAB — CBC WITH DIFFERENTIAL/PLATELET
Abs Immature Granulocytes: 0.01 10*3/uL (ref 0.00–0.07)
Basophils Absolute: 0 10*3/uL (ref 0.0–0.1)
Basophils Relative: 1 %
Eosinophils Absolute: 0.1 10*3/uL (ref 0.0–0.5)
Eosinophils Relative: 2 %
HCT: 23.6 % — ABNORMAL LOW (ref 36.0–46.0)
Hemoglobin: 7.1 g/dL — ABNORMAL LOW (ref 12.0–15.0)
Immature Granulocytes: 0 %
Lymphocytes Relative: 10 %
Lymphs Abs: 0.4 10*3/uL — ABNORMAL LOW (ref 0.7–4.0)
MCH: 27.5 pg (ref 26.0–34.0)
MCHC: 30.1 g/dL (ref 30.0–36.0)
MCV: 91.5 fL (ref 80.0–100.0)
Monocytes Absolute: 0.5 10*3/uL (ref 0.1–1.0)
Monocytes Relative: 11 %
Neutro Abs: 3.3 10*3/uL (ref 1.7–7.7)
Neutrophils Relative %: 76 %
Platelets: 217 10*3/uL (ref 150–400)
RBC: 2.58 MIL/uL — ABNORMAL LOW (ref 3.87–5.11)
RDW: 16.9 % — ABNORMAL HIGH (ref 11.5–15.5)
WBC: 4.4 10*3/uL (ref 4.0–10.5)
nRBC: 0 % (ref 0.0–0.2)

## 2023-04-28 LAB — MAGNESIUM: Magnesium: 2 mg/dL (ref 1.7–2.4)

## 2023-04-28 LAB — BASIC METABOLIC PANEL
Anion gap: 7 (ref 5–15)
BUN: 49 mg/dL — ABNORMAL HIGH (ref 6–20)
CO2: 22 mmol/L (ref 22–32)
Calcium: 8 mg/dL — ABNORMAL LOW (ref 8.9–10.3)
Chloride: 110 mmol/L (ref 98–111)
Creatinine, Ser: 4.22 mg/dL — ABNORMAL HIGH (ref 0.44–1.00)
GFR, Estimated: 12 mL/min — ABNORMAL LOW (ref >60)
Glucose, Bld: 112 mg/dL — ABNORMAL HIGH (ref 70–99)
Potassium: 5.7 mmol/L — ABNORMAL HIGH (ref 3.5–5.1)
Sodium: 139 mmol/L (ref 135–145)

## 2023-04-28 LAB — FERRITIN: Ferritin: 31 ng/mL (ref 11–307)

## 2023-04-28 MED ORDER — IRON SUCROSE 200 MG IVPB - SIMPLE MED
200.0000 mg | Status: DC
Start: 2023-04-28 — End: 2023-04-28
  Filled 2023-04-28: qty 110

## 2023-04-28 MED ORDER — HYDRALAZINE HCL 25 MG PO TABS: 25.0000 mg | ORAL_TABLET | Freq: Three times a day (TID) | ORAL | Status: DC

## 2023-04-28 MED ORDER — SODIUM ZIRCONIUM CYCLOSILICATE 10 G PO PACK: 10.0000 g | PACK | Freq: Every day | ORAL | Status: DC

## 2023-04-28 MED ORDER — ENOXAPARIN SODIUM 30 MG/0.3ML IJ SOSY
30.0000 mg | PREFILLED_SYRINGE | INTRAMUSCULAR | Status: DC
  Administered 2023-04-29 – 2023-05-03 (×5): 30 mg via SUBCUTANEOUS
  Filled 2023-04-28 (×5): qty 0.3

## 2023-04-28 MED ORDER — FUROSEMIDE 10 MG/ML IJ SOLN
120.0000 mg | Freq: Two times a day (BID) | INTRAVENOUS | Status: DC
  Administered 2023-04-28 – 2023-05-01 (×7): 120 mg via INTRAVENOUS
  Filled 2023-04-28: qty 12
  Filled 2023-04-28 (×7): qty 10

## 2023-04-28 MED ORDER — SODIUM ZIRCONIUM CYCLOSILICATE 10 G PO PACK
10.0000 g | PACK | Freq: Four times a day (QID) | ORAL | Status: AC
  Administered 2023-04-28 (×2): 10 g via ORAL
  Filled 2023-04-28 (×2): qty 1

## 2023-04-28 MED ORDER — SODIUM CHLORIDE 0.9 % IV SOLN
200.0000 mg | INTRAVENOUS | Status: AC
  Administered 2023-04-28 – 2023-05-02 (×5): 200 mg via INTRAVENOUS
  Filled 2023-04-28 (×5): qty 10

## 2023-04-28 MED ORDER — SODIUM ZIRCONIUM CYCLOSILICATE 10 G PO PACK: 10.0000 g | PACK | Freq: Three times a day (TID) | ORAL | Status: DC

## 2023-04-28 NOTE — Progress Notes (Signed)
Progress Note    Joanna [name]   GMW:[id number]  DOB: 1971/02/02  DOA: 04/26/2023     2 PCP: Hillery Aldo, NP  Initial CC: SOB, LE edema  Hospital Course: Joanna [name] is a 52 y.o. female with PMH IDD, dCHF, CKD who presented with worsening LE edema and SOB.  She had developed worsening symptoms after holding Lasix at home and not resuming.  On workup she was also found to be hypoxic at 88% on room air and was placed on oxygen.   Patient last hospitalization seems to have ended on February 04, 2023 when she was treated for hypertensive urgency as well as acute right MCA ischemic stroke.   She was admitted for diuresis and monitoring of renal function while resuming.  Interval History:  She appears to have diuresed a decent amount as her legs are softer today but still grossly overloaded.  Remains on 2 L but is 100% so possibly can be further weaned.  Slight worsening of renal function and asking for nephrology input today. Called and spoke with Joanna [name] to give an update.  She states that she is not her legal sister but patient refers to as her sister.  The nephew listed in epic appears to be the actual next of kin.  Assessment and Plan: * Acute on chronic diastolic CHF (congestive heart failure) (HCC) - Presented with worsening shortness of breath, lower extremity edema; has pulmonary crackles and CXR noted with cardiomegaly and interstitial edema -BNP elevated 1,342 -Was holding Lasix from worsened renal function at home but did not resume; suspect has developed worsening edema due to this - Continue IV Lasix and monitoring output; nephrology adjusting lasix dose - monitor output  Acute respiratory failure with hypoxia (HCC) - due to volume overload - continue O2 and wean as able   CKD (chronic kidney disease) stage 4, GFR 15-29 ml/min (HCC) - patient has history of CKD4. Baseline creat ~ 3.7 - 4, eGFR~ 15 - creat went from 3.7 on admission to 4.22 after IV lasix; output unclear but  light yellow in basin - given level of volume overload, hypoxia, and recurrent hyperK will ask for nephrology assistance as suspect she will need large doses of lasix and if unsuccessful might require dialysis; currently no criteria met to warrant but renal fxn seems frail - continue lasix per nephrology and monitor BMP and output   Hyperkalemia - 5.2 on admission - some worsening today but will watch with increased lasix dosing - lokelma ordered 11/10  Intellectual disability - noted; at baseline - Joanna [name] the nephew listed in epic appears to be the [facility] and sounds like the POA; Joanna [name] listed as sister is not actually her sister but someone patient calls her sister  Anemia - Baseline around 8 to 9 g/dL - No overt signs of bleeding -Continue trending for now and will transfuse if necessary - Hgb 7.1 g/dL today; will watch and if further drop will need PRBC  Type 2 diabetes mellitus without complication (HCC) - Last A1c 6% on 01/29/2023 - Continue diet control  HTN (hypertension) - Continue amlodipine, Coreg, Imdur -Hydralazine now on hold and will continue to hold others if necessary   Old records reviewed in assessment of this patient  Antimicrobials:   DVT prophylaxis:  enoxaparin (LOVENOX) injection 40 mg Start: 04/27/23 0800 SCDs Start: 04/26/23 2220   Code Status:   Code Status: Full Code  Mobility Assessment (Last 72 Hours)     Mobility Assessment     Row  Name 04/28/23 0948 04/28/23 0815 04/27/23 2000 04/27/23 1508 04/27/23 04:13:05   Does patient have an order for bedrest or is patient medically unstable No - Continue assessment No - Continue assessment No - Continue assessment No - Continue assessment No - Continue assessment   What is the highest level of mobility based on the progressive mobility assessment? Level 4 (Walks with assist in room) - Balance while marching in place and cannot step forward and back - Complete Level 4 (Walks with assist in room) -  Balance while marching in place and cannot step forward and back - Complete Level 2 (Chairfast) - Balance while sitting on edge of bed and cannot stand Level 2 (Chairfast) - Balance while sitting on edge of bed and cannot stand  baseline patient states she walks without any assistance Level 3 (Stands with assist) - Balance while standing  and cannot march in place   Is the above level different from baseline mobility prior to current illness? Yes - Recommend PT order Yes - Recommend PT order Yes - Recommend PT order Yes - Recommend PT order --            Barriers to discharge: none Disposition Plan:  Home Status is: Inpt  Objective: Blood pressure (!) 149/76, pulse 69, temperature 97.8 F (36.6 C), temperature source Oral, resp. rate 17, height 5\' 2"  (1.575 m), weight 122.4 kg, SpO2 100%.  Examination:  Physical Exam Constitutional:      Appearance: Normal appearance.  HENT:     Head: Normocephalic and atraumatic.     Mouth/Throat:     Mouth: Mucous membranes are moist.  Eyes:     Extraocular Movements: Extraocular movements intact.  Cardiovascular:     Rate and Rhythm: Normal rate and regular rhythm.  Pulmonary:     Effort: Pulmonary effort is normal. No respiratory distress.     Breath sounds: Normal breath sounds. No wheezing.  Abdominal:     General: Bowel sounds are normal. There is no distension.     Palpations: Abdomen is soft.     Tenderness: There is no abdominal tenderness.  Musculoskeletal:        General: Swelling (3+ LE edema) present. Normal range of motion.     Cervical back: Normal range of motion and neck supple.  Skin:    General: Skin is warm and dry.  Neurological:     General: No focal deficit present.     Mental Status: She is alert.  Psychiatric:        Mood and Affect: Mood normal.      Consultants:  Nephrology  Procedures:    Data Reviewed: Results for orders placed or performed during the hospital encounter of 04/26/23 (from the past 24  hour(s))  Basic metabolic panel     Status: Abnormal   Collection Time: 04/28/23  3:08 AM  Result Value Ref Range   Sodium 139 135 - 145 mmol/L   Potassium 5.7 (H) 3.5 - 5.1 mmol/L   Chloride 110 98 - 111 mmol/L   CO2 22 22 - 32 mmol/L   Glucose, Bld 112 (H) 70 - 99 mg/dL   BUN 49 (H) 6 - 20 mg/dL   Creatinine, Ser 1.61 (H) 0.44 - 1.00 mg/dL   Calcium 8.0 (L) 8.9 - 10.3 mg/dL   GFR, Estimated 12 (L) >60 mL/min   Anion gap 7 5 - 15  CBC with Differential/Platelet     Status: Abnormal   Collection Time: 04/28/23  3:08 AM  Result Value Ref Range   WBC 4.4 4.0 - 10.5 K/uL   RBC 2.58 (L) 3.87 - 5.11 MIL/uL   Hemoglobin 7.1 (L) 12.0 - 15.0 g/dL   HCT 10.2 (L) 72.5 - 36.6 %   MCV 91.5 80.0 - 100.0 fL   MCH 27.5 26.0 - 34.0 pg   MCHC 30.1 30.0 - 36.0 g/dL   RDW 44.0 (H) 34.7 - 42.5 %   Platelets 217 150 - 400 K/uL   nRBC 0.0 0.0 - 0.2 %   Neutrophils Relative % 76 %   Neutro Abs 3.3 1.7 - 7.7 K/uL   Lymphocytes Relative 10 %   Lymphs Abs 0.4 (L) 0.7 - 4.0 K/uL   Monocytes Relative 11 %   Monocytes Absolute 0.5 0.1 - 1.0 K/uL   Eosinophils Relative 2 %   Eosinophils Absolute 0.1 0.0 - 0.5 K/uL   Basophils Relative 1 %   Basophils Absolute 0.0 0.0 - 0.1 K/uL   Immature Granulocytes 0 %   Abs Immature Granulocytes 0.01 0.00 - 0.07 K/uL  Magnesium     Status: None   Collection Time: 04/28/23  3:08 AM  Result Value Ref Range   Magnesium 2.0 1.7 - 2.4 mg/dL    I have reviewed pertinent nursing notes, vitals, labs, and images as necessary. I have ordered labwork to follow up on as indicated.  I have reviewed the last notes from staff over past 24 hours. I have discussed patient's care plan and test results with nursing staff, CM/SW, and other staff as appropriate.  Time spent: Greater than 50% of the 55 minute visit was spent in counseling/coordination of care for the patient as laid out in the A&P.   LOS: 2 days   Lewie Chamber, MD [facility] 04/28/2023, 12:20 PM

## 2023-04-28 NOTE — Plan of Care (Signed)

## 2023-04-28 NOTE — Consult Note (Signed)
Nephrology Consult   Requesting provider: Lewie Chamber Service requesting consult: Hospitalist Reason for consult: AKI on CKD IV   Assessment/Recommendations: Joanna [name] is a/an 52 y.o. female with a past medical history diastolic heart failure, intellectual disability, CKD, HTN, morbid obesity, DM2 who presents with acute heart failure exacerbation  CKD IV: BL Crt appears to be around 3.7-4. Doesn't seem to have an AKI at this time and is fluctuating around her baseline. Some rise in Crt seen over the past 24 hours could be secondary to relative hypotension -Hypertension and heart failure management as below -Obtain urinalysis -UPC 2.3 g: Likely diabetic kidney disease -Continue to monitor daily Cr, Dose meds for GFR -Monitor Daily I/Os, Daily weight  -Maintain MAP>65 for optimal renal perfusion.  -Avoid nephrotoxic medications including NSAIDs -Use synthetic opioids (Fentanyl/Dilaudid) if needed -Consider renal ultrasound if kidney function worsens -Currently no indication for HD  Acute heart failure exacerbation: Recent echocardiogram with normal ejection fraction.  Massively volume overloaded with extensive lower extremity edema.  Will require higher doses of diuretics.  Increase Lasix to 120 mg IV twice daily.  Continue to follow weights daily and monitor ins and outs closely.  Hypertension: History of such.  Some low blood pressure since admission.  Stop hydralazine.  Can continue amlodipine and carvedilol and Imdur but consider stopping if needed.  Metabolic acidosis: History of such.  Likely related to CKD.  Stop sodium bicarbonate given bicarb at goal  Acute hypoxic respiratory failure: Diuresis as above.  Hyperkalemia: Secondary to AKI.  Diuretics will help.  Lokelma today.  Anemia: Likely multifactorial.  Obtain iron levels.  Uncontrolled Diabetes Mellitus Type 2 with Hyperglycemia: mgmt per primary   Recommendations conveyed to primary service.    Darnell Level  [facility] 04/28/2023 10:21 AM   _____________________________________________________________________________________ CC: Shortness of breath  History of Present Illness: Joanna [name] is a/an 52 y.o. female with a past medical history of diastolic heart failure, intellectual disability, CKD, HTN, morbid obesity, DM2 who presents with shortness of breath  Patient's history is somewhat limited because of her intellectual disability.  History was supplemented by chart review.  Patient presented to the hospital on 11/8 for worsening shortness of breath.  She had noticed worsening lower extremity edema for the past 1 to 2 weeks.  She also noticed worsening shortness of breath as well as dyspnea on exertion during that time.  She had not had chest pain, nausea, vomiting.  No fevers, chills.  No dysuria or hematuria.  Tried Lasix 80 mg twice daily at home.  Unclear if she was taking this or not.  On admission she was noted to be volume overloaded.  Chest x-ray with some edema.  She also was having hypoxia.  Was started on diuretics.  Poor response with weight gain since admission.  Creatinine was noted to increase to 4.2 today.  Blood pressure is intermittently low.  Patient states she feels about the same today.  Feels like her shortness of breath is not got much better.  Denies any other acute issues.   Medications:  Current Facility-Administered Medications  Medication Dose Route Frequency Provider Last Rate Last Admin   acetaminophen (TYLENOL) tablet 650 mg  650 mg Oral Q6H PRN Nolberto Hanlon, MD       Or   acetaminophen (TYLENOL) suppository 650 mg  650 mg Rectal Q6H PRN Nolberto Hanlon, MD       amLODipine (NORVASC) tablet 10 mg  10 mg Oral Daily Nolberto Hanlon, MD  10 mg at 04/28/23 0815   aspirin EC tablet 81 mg  81 mg Oral Daily Nolberto Hanlon, MD   81 mg at 04/28/23 0815   atorvastatin (LIPITOR) tablet 40 mg  40 mg Oral Daily Nolberto Hanlon, MD   40 mg at 04/28/23 0815    carvedilol (COREG) tablet 12.5 mg  12.5 mg Oral BID WC Nolberto Hanlon, MD   12.5 mg at 04/28/23 0814   enoxaparin (LOVENOX) injection 40 mg  40 mg Subcutaneous Q24H Nolberto Hanlon, MD   40 mg at 04/28/23 0815   furosemide (LASIX) 120 mg in dextrose 5 % 50 mL IVPB  120 mg Intravenous BID Darnell Level, MD       isosorbide mononitrate (IMDUR) 24 hr tablet 120 mg  120 mg Oral Daily Nolberto Hanlon, MD   120 mg at 04/28/23 0814   polyethylene glycol (MIRALAX / GLYCOLAX) packet 17 g  17 g Oral Daily PRN Nolberto Hanlon, MD       sodium bicarbonate tablet 1,300 mg  1,300 mg Oral BID Nolberto Hanlon, MD   1,300 mg at 04/28/23 0814   sodium chloride flush (NS) 0.9 % injection 3 mL  3 mL Intravenous Q12H Nolberto Hanlon, MD   3 mL at 04/27/23 2119   sodium zirconium cyclosilicate (LOKELMA) packet 10 g  10 g Oral Q6H Darnell Level, MD   10 g at 04/28/23 1191     ALLERGIES Patient has no known allergies.  MEDICAL HISTORY Past Medical History:  Diagnosis Date   Acute diastolic heart failure (HCC) 12/16/2019   Acute respiratory failure with hypoxia (HCC) 12/16/2019   AKI (acute kidney injury) (HCC) 12/16/2019   Anasarca 12/16/2019   CAD (coronary artery disease)    Diabetes mellitus without complication (HCC)    HTN (hypertension) 12/16/2019   Hypertension    Hypertensive urgency 12/16/2019   Intellectual disability 12/16/2019   Morbid obesity (HCC)    New onset of congestive heart failure (HCC) 12/16/2019   Obesity, Class III, BMI 40-49.9 (morbid obesity) (HCC) 12/16/2019   Type 2 diabetes mellitus without complication (HCC) 12/16/2019     SOCIAL HISTORY Social History   Socioeconomic History   Marital status: Single    Spouse name: Not on file   Number of children: 0   Years of education: Not on file   Highest education level: 9th grade  Occupational History   Not on file  Tobacco Use   Smoking status: Never   Smokeless tobacco: Never  Substance and Sexual Activity   Alcohol use: Not Currently     Comment: socially   Drug use: Never   Sexual activity: Not on file  Other Topics Concern   Not on file  Social History Narrative   07/26/21 lives with caregiver   Social Determinants of Health   Financial Resource Strain: Medium Risk (11/30/2022)   Received from [facility]   Overall Financial Resource Strain (CARDIA)    Difficulty of Paying Living Expenses: Somewhat hard  Food Insecurity: No Food Insecurity (04/27/2023)   Hunger Vital Sign    Worried About Running Out of Food in the Last Year: Never true    Ran Out of Food in the Last Year: Never true  Transportation Needs: No Transportation Needs (04/27/2023)   PRAPARE - Administrator, Civil Service (Medical): No    Lack of Transportation (Non-Medical): No  Physical Activity: Insufficiently Active (11/30/2022)   Received from [facility]   Exercise Vital Sign  Days of Exercise per Week: 2 days    Minutes of Exercise per Session: 20 min  Stress: No Stress Concern Present (11/30/2022)   Received from [facility] - Occupational Stress Questionnaire    Feeling of Stress : Not at all  Social Connections: Socially Integrated (11/30/2022)   Received from [facility]   Social Network    How would you rate your social network (family, work, friends)?: Good participation with social networks  Intimate Partner Violence: Not At Risk (04/27/2023)   Humiliation, Afraid, Rape, and Kick questionnaire    Fear of Current or Ex-Partner: No    Emotionally Abused: No    Physically Abused: No    Sexually Abused: No     FAMILY HISTORY Family History  Problem Relation Age of Onset   Heart disease Mother        Enlarged heart per patient   Hypertension Mother    Diabetes Mother    Hypertension Sister    Diabetes Sister       Review of Systems: 12 systems reviewed Otherwise as per HPI, all other systems reviewed and negative  Physical Exam: Vitals:   04/28/23 0411  04/28/23 0730  BP: 136/72 139/74  Pulse: 77 86  Resp: 16 20  Temp: 98 F (36.7 C) 98 F (36.7 C)  SpO2: 94% 94%   Total I/O In: 120 [P.O.:120] Out: -   Intake/Output Summary (Last 24 hours) at 04/28/2023 1021 Last data filed at 04/28/2023 0842 Gross per 24 hour  Intake 120 ml  Output 300 ml  Net -180 ml   General: well-appearing, no acute distress HEENT: anicteric sclera, oropharynx clear without lesions CV: Normal rate, no rub, 2+ pitting edema in the bilateral lower extremities up to the thigh, some abdominal edema as well Lungs: clear to auscultation bilaterally, normal work of breathing Abd: soft, non-tender, non-distended Skin: no visible lesions or rashes Psych: alert, engaged, appropriate mood and affect Musculoskeletal: no obvious deformities Neuro: normal speech, no gross focal deficits   Test Results Reviewed Lab Results  Component Value Date   NA 139 04/28/2023   K 5.7 (H) 04/28/2023   CL 110 04/28/2023   CO2 22 04/28/2023   BUN 49 (H) 04/28/2023   CREATININE 4.22 (H) 04/28/2023   CALCIUM 8.0 (L) 04/28/2023   ALBUMIN 2.6 (L) 04/27/2023   PHOS 4.1 04/27/2023    CBC Recent Labs  Lab 04/26/23 1720 04/26/23 2345 04/27/23 0118 04/28/23 0308  WBC 4.2 3.6* 3.6* 4.4  NEUTROABS 2.9  --   --  3.3  HGB 7.3* 7.4* 7.4* 7.1*  HCT 24.7* 24.5* 24.7* 23.6*  MCV 91.5 90.4 91.1 91.5  PLT 242 243 239 217    I have reviewed all relevant outside healthcare records related to the patient's current hospitalization

## 2023-04-29 DIAGNOSIS — I5033 Acute on chronic diastolic (congestive) heart failure: Secondary | ICD-10-CM | POA: Diagnosis not present

## 2023-04-29 DIAGNOSIS — N184 Chronic kidney disease, stage 4 (severe): Secondary | ICD-10-CM | POA: Diagnosis not present

## 2023-04-29 DIAGNOSIS — J9601 Acute respiratory failure with hypoxia: Secondary | ICD-10-CM | POA: Diagnosis not present

## 2023-04-29 DIAGNOSIS — E875 Hyperkalemia: Secondary | ICD-10-CM | POA: Diagnosis not present

## 2023-04-29 LAB — CBC WITH DIFFERENTIAL/PLATELET
Abs Immature Granulocytes: 0.02 10*3/uL (ref 0.00–0.07)
Basophils Absolute: 0 10*3/uL (ref 0.0–0.1)
Basophils Relative: 1 %
Eosinophils Absolute: 0.1 10*3/uL (ref 0.0–0.5)
Eosinophils Relative: 2 %
HCT: 23.3 % — ABNORMAL LOW (ref 36.0–46.0)
Hemoglobin: 7 g/dL — ABNORMAL LOW (ref 12.0–15.0)
Immature Granulocytes: 0 %
Lymphocytes Relative: 8 %
Lymphs Abs: 0.4 10*3/uL — ABNORMAL LOW (ref 0.7–4.0)
MCH: 27.5 pg (ref 26.0–34.0)
MCHC: 30 g/dL (ref 30.0–36.0)
MCV: 91.4 fL (ref 80.0–100.0)
Monocytes Absolute: 0.6 10*3/uL (ref 0.1–1.0)
Monocytes Relative: 12 %
Neutro Abs: 4.1 10*3/uL (ref 1.7–7.7)
Neutrophils Relative %: 77 %
Platelets: 206 10*3/uL (ref 150–400)
RBC: 2.55 MIL/uL — ABNORMAL LOW (ref 3.87–5.11)
RDW: 16.7 % — ABNORMAL HIGH (ref 11.5–15.5)
WBC: 5.3 10*3/uL (ref 4.0–10.5)
nRBC: 0 % (ref 0.0–0.2)

## 2023-04-29 LAB — BASIC METABOLIC PANEL
Anion gap: 5 (ref 5–15)
BUN: 52 mg/dL — ABNORMAL HIGH (ref 6–20)
CO2: 23 mmol/L (ref 22–32)
Calcium: 7.8 mg/dL — ABNORMAL LOW (ref 8.9–10.3)
Chloride: 110 mmol/L (ref 98–111)
Creatinine, Ser: 4.26 mg/dL — ABNORMAL HIGH (ref 0.44–1.00)
GFR, Estimated: 12 mL/min — ABNORMAL LOW (ref >60)
Glucose, Bld: 114 mg/dL — ABNORMAL HIGH (ref 70–99)
Potassium: 5.1 mmol/L (ref 3.5–5.1)
Sodium: 138 mmol/L (ref 135–145)

## 2023-04-29 LAB — HEMOGLOBIN AND HEMATOCRIT, BLOOD
HCT: 25.9 % — ABNORMAL LOW (ref 36.0–46.0)
Hemoglobin: 7.9 g/dL — ABNORMAL LOW (ref 12.0–15.0)

## 2023-04-29 LAB — PREPARE RBC (CROSSMATCH)

## 2023-04-29 LAB — MAGNESIUM: Magnesium: 2 mg/dL (ref 1.7–2.4)

## 2023-04-29 MED ORDER — DARBEPOETIN ALFA 60 MCG/0.3ML IJ SOSY
60.0000 ug | PREFILLED_SYRINGE | INTRAMUSCULAR | Status: DC
Start: 2023-04-30 — End: 2023-05-03
  Administered 2023-04-30: 60 ug via SUBCUTANEOUS
  Filled 2023-04-29: qty 0.3

## 2023-04-29 MED ORDER — IRON SUCROSE 200 MG IVPB - SIMPLE MED: 200.0000 mg | Status: DC

## 2023-04-29 MED ORDER — SODIUM CHLORIDE 0.9% IV SOLUTION: Freq: Once | INTRAVENOUS | Status: AC

## 2023-04-29 MED ORDER — SODIUM CHLORIDE 0.9% IV SOLUTION: Freq: Once | INTRAVENOUS | Status: DC

## 2023-04-29 NOTE — Progress Notes (Signed)
[facility] Progress Note   Subjective:   no new issues, feeling ok today. I/Os yest 0.6 / 1.9.   Objective Vitals:   04/29/23 0720 04/29/23 0939 04/29/23 0956 04/29/23 1011  BP: (!) 142/71 139/71 139/71 (!) 145/84  Pulse: 74 76 76 74  Resp: 18 19 18 18   Temp: 98 F (36.7 C) 98.2 F (36.8 C) 98.2 F (36.8 C) 98.3 F (36.8 C)  TempSrc: Oral Oral Oral Oral  SpO2: 93% 94%  94%  Weight:      Height:       Physical Exam General: obese woman lying comfortably in bed Heart:RRR no rub Lungs: clear to bases Abdomen:soft obese Extremities: 2+ pitting LE edema Neuro:  conversant, awake and alert, grossly nonfocal   Additional Objective Labs: Basic Metabolic Panel: Recent Labs  Lab 04/27/23 0118 04/28/23 0308 04/29/23 0303  NA 139 139 138  K 4.7 5.7* 5.1  CL 109 110 110  CO2 21* 22 23  GLUCOSE 102* 112* 114*  BUN 45* 49* 52*  CREATININE 3.79* 4.22* 4.26*  CALCIUM 8.2* 8.0* 7.8*  PHOS 4.1  --   --    Liver Function Tests: Recent Labs  Lab 04/27/23 0118  AST 23  ALT 26  ALKPHOS 59  BILITOT 0.5  PROT 5.4*  ALBUMIN 2.6*   No results for input(s): "LIPASE", "AMYLASE" in the last 168 hours. CBC: Recent Labs  Lab 04/26/23 1720 04/26/23 2345 04/27/23 0118 04/28/23 0308 04/29/23 0303  WBC 4.2 3.6* 3.6* 4.4 5.3  NEUTROABS 2.9  --   --  3.3 4.1  HGB 7.3* 7.4* 7.4* 7.1* 7.0*  HCT 24.7* 24.5* 24.7* 23.6* 23.3*  MCV 91.5 90.4 91.1 91.5 91.4  PLT 242 243 239 217 206   Blood Culture No results found for: "SDES", "SPECREQUEST", "CULT", "REPTSTATUS"  Cardiac Enzymes: No results for input(s): "CKTOTAL", "CKMB", "CKMBINDEX", "TROPONINI" in the last 168 hours. CBG: No results for input(s): "GLUCAP" in the last 168 hours. Iron Studies:  Recent Labs    04/28/23 1126  IRON 28  TIBC 312  FERRITIN 31   @[email or web address] @ Studies/Results: No results found. Medications:  furosemide 120 mg (04/29/23 0936)   iron sucrose 440 mL/hr at 04/28/23 1821     sodium chloride   Intravenous Once   amLODipine  10 mg Oral Daily   aspirin EC  81 mg Oral Daily   atorvastatin  40 mg Oral Daily   carvedilol  12.5 mg Oral BID WC   enoxaparin (LOVENOX) injection  30 mg Subcutaneous Q24H   isosorbide mononitrate  120 mg Oral Daily   sodium chloride flush  3 mL Intravenous Q12H    Assessment/Recommendations: Joanna [name] is a/an 52 y.o. female with a past medical history diastolic heart failure, intellectual disability, CKD, HTN, morbid obesity, DM2 who presents with acute heart failure exacerbation   CKD IV: BL Crt appears to be around 3.7-4. Doesn't seem to have an AKI at this time and is fluctuating around her baseline. Some rise in Crt seen over the past 24 hours could be secondary to relative hypotension. UA 1+ protein as priors. UP/C 2.3, presumed DKD. -Hypertension and heart failure management as below -Continue to monitor daily Cr, Dose meds for GFR -Monitor Daily I/Os, Daily weight  -Maintain MAP>65 for optimal renal perfusion.  -Avoid nephrotoxic medications including NSAIDs -Use synthetic opioids (Fentanyl/Dilaudid) if needed -Currently no indication for HD   Acute heart failure exacerbation: Recent echocardiogram with normal ejection fraction.  Massively volume overloaded with  extensive lower extremity edema.  Will require higher doses of diuretics.  Increased Lasix to 120 mg IV twice daily and diuresing.  Continue to follow weights daily and monitor ins and outs closely.  Low sodium diet.    Hypertension: History of such.  Some low blood pressure since admission.  Stopped hydralazine.  On amlodipine and carvedilol and Imdur but consider stopping if needed.   Acute hypoxic respiratory failure: Diuresis as above.   Hyperkalemia: Secondary to AKI.  Diuretics will help.  Lokelma 11/10.   Anemia, normocytic:  Likely multifactorial. Hb in 7s, iron sat 9%.  Started IV iron and ESA.    Uncontrolled Diabetes Mellitus Type 2 with Hyperglycemia: mgmt  per primary  Estill Bakes MD 04/29/2023, 12:03 PM  [facility] Pager: [phone]

## 2023-04-29 NOTE — Plan of Care (Signed)

## 2023-04-29 NOTE — Progress Notes (Signed)
Progress Note    Joanna [name]   ZOX:[id number]  DOB: [birth year]  DOA: 04/26/2023     3 PCP: Hillery Aldo, NP  Initial CC: SOB, LE edema  Hospital Course: Joanna [name] is a 52 y.o. female with PMH IDD, dCHF, CKD who presented with worsening LE edema and SOB.  She had developed worsening symptoms after holding Lasix at home and not resuming.  On workup she was also found to be hypoxic at 88% on room air and was placed on oxygen.   Patient last hospitalization seems to have ended on February 04, 2023 when she was treated for hypertensive urgency as well as acute right MCA ischemic stroke.   She was admitted for diuresis and monitoring of renal function while resuming.  Interval History:  No events overnight.  Breathing remains improved.  Still has quite a bit of lower extremity edema.  Assessment and Plan: * Acute on chronic diastolic CHF (congestive heart failure) (HCC) - Presented with worsening shortness of breath, lower extremity edema; has pulmonary crackles and CXR noted with cardiomegaly and interstitial edema -BNP elevated 1,342 -Was holding Lasix from worsened renal function at home but did not resume; suspect has developed worsening edema due to this - Continue IV Lasix and monitoring output; nephrology adjusting lasix dose - monitor output  Acute respiratory failure with hypoxia (HCC) - due to volume overload - continue O2 and wean as able   CKD (chronic kidney disease) stage 4, GFR 15-29 ml/min (HCC) - patient has history of CKD4. Baseline creat ~ 3.7 - 4, eGFR~ 15 - creat went from 3.7 on admission to 4.22 after IV lasix; output unclear but light yellow in basin - given level of volume overload, hypoxia, and recurrent hyperK will ask for nephrology assistance as suspect she will need large doses of lasix and if unsuccessful might require dialysis; currently no criteria met to warrant but renal fxn seems frail - continue lasix per nephrology and monitor BMP and output    Hyperkalemia - 5.2 on admission - some fluctuating; lokelma ordered 11/10  Intellectual disability - noted; at baseline - [name] the nephew listed in epic appears to be the [facility] and sounds like the POA; [name] listed as sister is not actually her sister but someone patient calls her sister  Normocytic anemia - Baseline around 8 to 9 g/dL - No overt signs of bleeding -Continue trending for now and will transfuse if necessary - Hgb 7 g/dL today; will give 1 unit PRBC  Type 2 diabetes mellitus without complication (HCC) - Last A1c 6% on 01/29/2023 - Continue diet control  HTN (hypertension) - Continue amlodipine, Coreg, Imdur -Hydralazine now on hold and will continue to hold others if necessary   Old records reviewed in assessment of this patient  Antimicrobials:   DVT prophylaxis:  enoxaparin (LOVENOX) injection 30 mg Start: 04/29/23 0800 SCDs Start: 04/26/23 2220   Code Status:   Code Status: Full Code  Mobility Assessment (Last 72 Hours)     Mobility Assessment     Row Name 04/28/23 2050 04/28/23 0948 04/28/23 0815 04/27/23 2000 04/27/23 1508   Does patient have an order for bedrest or is patient medically unstable No - Continue assessment No - Continue assessment No - Continue assessment No - Continue assessment No - Continue assessment   What is the highest level of mobility based on the progressive mobility assessment? Level 4 (Walks with assist in room) - Balance while marching in place and cannot step forward and back -  Complete Level 4 (Walks with assist in room) - Balance while marching in place and cannot step forward and back - Complete Level 4 (Walks with assist in room) - Balance while marching in place and cannot step forward and back - Complete Level 2 (Chairfast) - Balance while sitting on edge of bed and cannot stand Level 2 (Chairfast) - Balance while sitting on edge of bed and cannot stand  baseline patient states she walks without any assistance   Is the  above level different from baseline mobility prior to current illness? Yes - Recommend PT order Yes - Recommend PT order Yes - Recommend PT order Yes - Recommend PT order Yes - Recommend PT order    Row Name 04/27/23 04:13:05           Does patient have an order for bedrest or is patient medically unstable No - Continue assessment       What is the highest level of mobility based on the progressive mobility assessment? Level 3 (Stands with assist) - Balance while standing  and cannot march in place                Barriers to discharge: none Disposition Plan:  Home Status is: Inpt  Objective: Blood pressure (!) 145/84, pulse 74, temperature 98.3 F (36.8 C), temperature source Oral, resp. rate 18, height 5\' 2"  (1.575 m), weight 119.7 kg, SpO2 94%.  Examination:  Physical Exam Constitutional:      Appearance: Normal appearance.  HENT:     Head: Normocephalic and atraumatic.     Mouth/Throat:     Mouth: Mucous membranes are moist.  Eyes:     Extraocular Movements: Extraocular movements intact.  Cardiovascular:     Rate and Rhythm: Normal rate and regular rhythm.  Pulmonary:     Effort: Pulmonary effort is normal. No respiratory distress.     Breath sounds: Normal breath sounds. No wheezing.  Abdominal:     General: Bowel sounds are normal. There is no distension.     Palpations: Abdomen is soft.     Tenderness: There is no abdominal tenderness.  Musculoskeletal:        General: Swelling (3+ LE edema (some improvement)) present. Normal range of motion.     Cervical back: Normal range of motion and neck supple.  Skin:    General: Skin is warm and dry.  Neurological:     General: No focal deficit present.     Mental Status: She is alert.  Psychiatric:        Mood and Affect: Mood normal.      Consultants:  Nephrology  Procedures:    Data Reviewed: Results for orders placed or performed during the hospital encounter of 04/26/23 (from the past 24 hour(s))  Iron  and TIBC     Status: Abnormal   Collection Time: 04/28/23 11:26 AM  Result Value Ref Range   Iron 28 28 - 170 ug/dL   TIBC 161 096 - 045 ug/dL   Saturation Ratios 9 (L) 10.4 - 31.8 %   UIBC 284 ug/dL  Ferritin     Status: None   Collection Time: 04/28/23 11:26 AM  Result Value Ref Range   Ferritin 31 11 - 307 ng/mL  Urinalysis, Routine w reflex microscopic -Urine, Clean Catch     Status: Abnormal   Collection Time: 04/28/23  1:20 PM  Result Value Ref Range   Color, Urine YELLOW YELLOW   APPearance CLEAR CLEAR   Specific Gravity, Urine  1.010 1.005 - 1.030   pH 6.0 5.0 - 8.0   Glucose, UA NEGATIVE NEGATIVE mg/dL   Hgb urine dipstick NEGATIVE NEGATIVE   Bilirubin Urine NEGATIVE NEGATIVE   Ketones, ur NEGATIVE NEGATIVE mg/dL   Protein, ur 161 (A) NEGATIVE mg/dL   Nitrite NEGATIVE NEGATIVE   Leukocytes,Ua NEGATIVE NEGATIVE   RBC / HPF 0-5 0 - 5 RBC/hpf   WBC, UA 0-5 0 - 5 WBC/hpf   Bacteria, UA RARE (A) NONE SEEN   Squamous Epithelial / HPF 0-5 0 - 5 /HPF  Basic metabolic panel     Status: Abnormal   Collection Time: 04/29/23  3:03 AM  Result Value Ref Range   Sodium 138 135 - 145 mmol/L   Potassium 5.1 3.5 - 5.1 mmol/L   Chloride 110 98 - 111 mmol/L   CO2 23 22 - 32 mmol/L   Glucose, Bld 114 (H) 70 - 99 mg/dL   BUN 52 (H) 6 - 20 mg/dL   Creatinine, Ser 0.96 (H) 0.44 - 1.00 mg/dL   Calcium 7.8 (L) 8.9 - 10.3 mg/dL   GFR, Estimated 12 (L) >60 mL/min   Anion gap 5 5 - 15  CBC with Differential/Platelet     Status: Abnormal   Collection Time: 04/29/23  3:03 AM  Result Value Ref Range   WBC 5.3 4.0 - 10.5 K/uL   RBC 2.55 (L) 3.87 - 5.11 MIL/uL   Hemoglobin 7.0 (L) 12.0 - 15.0 g/dL   HCT 04.5 (L) 40.9 - 81.1 %   MCV 91.4 80.0 - 100.0 fL   MCH 27.5 26.0 - 34.0 pg   MCHC 30.0 30.0 - 36.0 g/dL   RDW 91.4 (H) 78.2 - 95.6 %   Platelets 206 150 - 400 K/uL   nRBC 0.0 0.0 - 0.2 %   Neutrophils Relative % 77 %   Neutro Abs 4.1 1.7 - 7.7 K/uL   Lymphocytes Relative 8 %   Lymphs  Abs 0.4 (L) 0.7 - 4.0 K/uL   Monocytes Relative 12 %   Monocytes Absolute 0.6 0.1 - 1.0 K/uL   Eosinophils Relative 2 %   Eosinophils Absolute 0.1 0.0 - 0.5 K/uL   Basophils Relative 1 %   Basophils Absolute 0.0 0.0 - 0.1 K/uL   Immature Granulocytes 0 %   Abs Immature Granulocytes 0.02 0.00 - 0.07 K/uL  Magnesium     Status: None   Collection Time: 04/29/23  3:03 AM  Result Value Ref Range   Magnesium 2.0 1.7 - 2.4 mg/dL  Prepare RBC (crossmatch)     Status: None   Collection Time: 04/29/23  9:06 AM  Result Value Ref Range   Order Confirmation      ORDER PROCESSED BY BLOOD BANK Performed at [facility], [address], [place], [place] [address]     I have reviewed pertinent nursing notes, vitals, labs, and images as necessary. I have ordered labwork to follow up on as indicated.  I have reviewed the last notes from staff over past 24 hours. I have discussed patient's care plan and test results with nursing staff, CM/SW, and other staff as appropriate.  Time spent: Greater than 50% of the 55 minute visit was spent in counseling/coordination of care for the patient as laid out in the A&P.   LOS: 3 days   Lewie Chamber, MD [facility] 04/29/2023, 11:12 AM

## 2023-04-30 DIAGNOSIS — I5033 Acute on chronic diastolic (congestive) heart failure: Secondary | ICD-10-CM | POA: Diagnosis not present

## 2023-04-30 DIAGNOSIS — E875 Hyperkalemia: Secondary | ICD-10-CM | POA: Diagnosis not present

## 2023-04-30 DIAGNOSIS — N184 Chronic kidney disease, stage 4 (severe): Secondary | ICD-10-CM | POA: Diagnosis not present

## 2023-04-30 DIAGNOSIS — J9601 Acute respiratory failure with hypoxia: Secondary | ICD-10-CM | POA: Diagnosis not present

## 2023-04-30 LAB — CBC WITH DIFFERENTIAL/PLATELET
Abs Immature Granulocytes: 0.03 10*3/uL (ref 0.00–0.07)
Basophils Absolute: 0.1 10*3/uL (ref 0.0–0.1)
Basophils Relative: 1 %
Eosinophils Absolute: 0.1 10*3/uL (ref 0.0–0.5)
Eosinophils Relative: 3 %
HCT: 25 % — ABNORMAL LOW (ref 36.0–46.0)
Hemoglobin: 7.5 g/dL — ABNORMAL LOW (ref 12.0–15.0)
Immature Granulocytes: 1 %
Lymphocytes Relative: 9 %
Lymphs Abs: 0.5 10*3/uL — ABNORMAL LOW (ref 0.7–4.0)
MCH: 27 pg (ref 26.0–34.0)
MCHC: 30 g/dL (ref 30.0–36.0)
MCV: 89.9 fL (ref 80.0–100.0)
Monocytes Absolute: 0.7 10*3/uL (ref 0.1–1.0)
Monocytes Relative: 13 %
Neutro Abs: 3.9 10*3/uL (ref 1.7–7.7)
Neutrophils Relative %: 73 %
Platelets: 225 10*3/uL (ref 150–400)
RBC: 2.78 MIL/uL — ABNORMAL LOW (ref 3.87–5.11)
RDW: 16.9 % — ABNORMAL HIGH (ref 11.5–15.5)
WBC: 5.3 10*3/uL (ref 4.0–10.5)
nRBC: 0 % (ref 0.0–0.2)

## 2023-04-30 LAB — TYPE AND SCREEN
ABO/RH(D): A POS
Antibody Screen: NEGATIVE
Unit division: 0

## 2023-04-30 LAB — HAPTOGLOBIN: Haptoglobin: 145 mg/dL (ref 42–296)

## 2023-04-30 LAB — BPAM RBC
Blood Product Expiration Date: 202411272359
ISSUE DATE / TIME: 202411110950
Unit Type and Rh: 6200

## 2023-04-30 LAB — RENAL FUNCTION PANEL
Albumin: 2.3 g/dL — ABNORMAL LOW (ref 3.5–5.0)
Anion gap: 8 (ref 5–15)
BUN: 56 mg/dL — ABNORMAL HIGH (ref 6–20)
CO2: 22 mmol/L (ref 22–32)
Calcium: 8 mg/dL — ABNORMAL LOW (ref 8.9–10.3)
Chloride: 107 mmol/L (ref 98–111)
Creatinine, Ser: 4.27 mg/dL — ABNORMAL HIGH (ref 0.44–1.00)
GFR, Estimated: 12 mL/min — ABNORMAL LOW (ref >60)
Glucose, Bld: 103 mg/dL — ABNORMAL HIGH (ref 70–99)
Phosphorus: 4 mg/dL (ref 2.5–4.6)
Potassium: 4.8 mmol/L (ref 3.5–5.1)
Sodium: 137 mmol/L (ref 135–145)

## 2023-04-30 LAB — MAGNESIUM: Magnesium: 1.8 mg/dL (ref 1.7–2.4)

## 2023-04-30 MED ORDER — HYDRALAZINE HCL 20 MG/ML IJ SOLN: 10.0000 mg | INTRAMUSCULAR | Status: DC | PRN

## 2023-04-30 NOTE — Progress Notes (Signed)
Progress Note    Joanna [name]   ZOX:[id number]  DOB: 02/05/1971  DOA: 04/26/2023     4 PCP: Hillery Aldo, NP  Initial CC: SOB, LE edema  Hospital Course: Joanna [name] is a 52 y.o. female with PMH IDD, dCHF, CKD who presented with worsening LE edema and SOB.  She had developed worsening symptoms after holding Lasix at home and not resuming.  On workup she was also found to be hypoxic at 88% on room air and was placed on oxygen.   Patient last hospitalization seems to have ended on February 04, 2023 when she was treated for hypertensive urgency as well as acute right MCA ischemic stroke.   She was admitted for diuresis and monitoring of renal function while resuming.  Interval History:  No events overnight. Voiding well. Still says breathing is better but still on O2 but needs to ambulate. Ted hose placed and having some small improvement in LE edema.   Assessment and Plan: * Acute on chronic diastolic CHF (congestive heart failure) (HCC) - Presented with worsening shortness of breath, lower extremity edema; has pulmonary crackles and CXR noted with cardiomegaly and interstitial edema -BNP elevated 1,342 -Was holding Lasix from worsened renal function at home but did not resume; suspect has developed worsening edema due to this - Continue IV Lasix and monitoring output; nephrology adjusting lasix dose - monitor output  Acute respiratory failure with hypoxia (HCC) - due to volume overload - continue O2 and wean as able  -Needs walk test prior to discharge  CKD (chronic kidney disease) stage 4, GFR 15-29 ml/min (HCC) - patient has history of CKD4. Baseline creat ~ 3.7 - 4, eGFR~ 15 - creat went from 3.7 on admission to 4.22 after IV lasix; output unclear but light yellow in basin - given level of volume overload, hypoxia, and recurrent hyperK will ask for nephrology assistance as suspect she will need large doses of lasix and if unsuccessful might require dialysis; currently no criteria  met to warrant but renal fxn seems frail - continue lasix per nephrology and monitor BMP and output   Hyperkalemia - 5.2 on admission - some fluctuating; lokelma ordered 11/10  Intellectual disability - noted; at baseline - [name] the nephew listed in epic appears to be the [facility] and sounds like the POA; [name] listed as sister is not actually her sister but someone patient calls her sister  Normocytic anemia - Baseline around 8 to 9 g/dL - No overt signs of bleeding -Continue trending for now and will transfuse if necessary - Hgb 7 g/dL on 81/19; will give 1 unit PRBC - trend H/H  Type 2 diabetes mellitus without complication (HCC) - Last A1c 6% on 01/29/2023 - Continue diet control  HTN (hypertension) - Continue amlodipine, Coreg, Imdur -Hydralazine PRN   Old records reviewed in assessment of this patient  Antimicrobials:   DVT prophylaxis:  Place TED hose Start: 04/29/23 1115 enoxaparin (LOVENOX) injection 30 mg Start: 04/29/23 0800 SCDs Start: 04/26/23 2220   Code Status:   Code Status: Full Code  Mobility Assessment (Last 72 Hours)     Mobility Assessment     Row Name 04/30/23 0829 04/29/23 2135 04/29/23 0900 04/28/23 2050 04/28/23 0948   Does patient have an order for bedrest or is patient medically unstable No - Continue assessment No - Continue assessment No - Continue assessment No - Continue assessment No - Continue assessment   What is the highest level of mobility based on the progressive mobility assessment?  Level 4 (Walks with assist in room) - Balance while marching in place and cannot step forward and back - Complete Level 4 (Walks with assist in room) - Balance while marching in place and cannot step forward and back - Complete Level 4 (Walks with assist in room) - Balance while marching in place and cannot step forward and back - Complete Level 4 (Walks with assist in room) - Balance while marching in place and cannot step forward and back - Complete Level  4 (Walks with assist in room) - Balance while marching in place and cannot step forward and back - Complete   Is the above level different from baseline mobility prior to current illness? Yes - Recommend PT order Yes - Recommend PT order Yes - Recommend PT order Yes - Recommend PT order Yes - Recommend PT order    Row Name 04/28/23 0815 04/27/23 2000 04/27/23 1508       Does patient have an order for bedrest or is patient medically unstable No - Continue assessment No - Continue assessment No - Continue assessment     What is the highest level of mobility based on the progressive mobility assessment? Level 4 (Walks with assist in room) - Balance while marching in place and cannot step forward and back - Complete Level 2 (Chairfast) - Balance while sitting on edge of bed and cannot stand Level 2 (Chairfast) - Balance while sitting on edge of bed and cannot stand  baseline patient states she walks without any assistance     Is the above level different from baseline mobility prior to current illness? Yes - Recommend PT order Yes - Recommend PT order Yes - Recommend PT order              Barriers to discharge: none Disposition Plan:  Home Status is: Inpt  Objective: Blood pressure (!) 148/98, pulse 73, temperature 97.7 F (36.5 C), temperature source Oral, resp. rate (!) 21, height 5\' 2"  (1.575 m), weight 121 kg, SpO2 100%.  Examination:  Physical Exam Constitutional:      Appearance: Normal appearance.  HENT:     Head: Normocephalic and atraumatic.     Mouth/Throat:     Mouth: Mucous membranes are moist.  Eyes:     Extraocular Movements: Extraocular movements intact.  Cardiovascular:     Rate and Rhythm: Normal rate and regular rhythm.  Pulmonary:     Effort: Pulmonary effort is normal. No respiratory distress.     Breath sounds: Normal breath sounds. No wheezing.  Abdominal:     General: Bowel sounds are normal. There is no distension.     Palpations: Abdomen is soft.      Tenderness: There is no abdominal tenderness.  Musculoskeletal:        General: Swelling (3+ LE edema (some improvement)) present. Normal range of motion.     Cervical back: Normal range of motion and neck supple.  Skin:    General: Skin is warm and dry.  Neurological:     General: No focal deficit present.     Mental Status: She is alert.  Psychiatric:        Mood and Affect: Mood normal.      Consultants:  Nephrology  Procedures:    Data Reviewed: Results for orders placed or performed during the hospital encounter of 04/26/23 (from the past 24 hour(s))  Hemoglobin and hematocrit, blood     Status: Abnormal   Collection Time: 04/29/23  5:32 PM  Result  Value Ref Range   Hemoglobin 7.9 (L) 12.0 - 15.0 g/dL   HCT 63.0 (L) 16.0 - 10.9 %  CBC with Differential/Platelet     Status: Abnormal   Collection Time: 04/30/23  4:02 AM  Result Value Ref Range   WBC 5.3 4.0 - 10.5 K/uL   RBC 2.78 (L) 3.87 - 5.11 MIL/uL   Hemoglobin 7.5 (L) 12.0 - 15.0 g/dL   HCT 32.3 (L) 55.7 - 32.2 %   MCV 89.9 80.0 - 100.0 fL   MCH 27.0 26.0 - 34.0 pg   MCHC 30.0 30.0 - 36.0 g/dL   RDW 02.5 (H) 42.7 - 06.2 %   Platelets 225 150 - 400 K/uL   nRBC 0.0 0.0 - 0.2 %   Neutrophils Relative % 73 %   Neutro Abs 3.9 1.7 - 7.7 K/uL   Lymphocytes Relative 9 %   Lymphs Abs 0.5 (L) 0.7 - 4.0 K/uL   Monocytes Relative 13 %   Monocytes Absolute 0.7 0.1 - 1.0 K/uL   Eosinophils Relative 3 %   Eosinophils Absolute 0.1 0.0 - 0.5 K/uL   Basophils Relative 1 %   Basophils Absolute 0.1 0.0 - 0.1 K/uL   Immature Granulocytes 1 %   Abs Immature Granulocytes 0.03 0.00 - 0.07 K/uL  Renal function panel     Status: Abnormal   Collection Time: 04/30/23  4:02 AM  Result Value Ref Range   Sodium 137 135 - 145 mmol/L   Potassium 4.8 3.5 - 5.1 mmol/L   Chloride 107 98 - 111 mmol/L   CO2 22 22 - 32 mmol/L   Glucose, Bld 103 (H) 70 - 99 mg/dL   BUN 56 (H) 6 - 20 mg/dL   Creatinine, Ser 3.76 (H) 0.44 - 1.00 mg/dL    Calcium 8.0 (L) 8.9 - 10.3 mg/dL   Phosphorus 4.0 2.5 - 4.6 mg/dL   Albumin 2.3 (L) 3.5 - 5.0 g/dL   GFR, Estimated 12 (L) >60 mL/min   Anion gap 8 5 - 15  Magnesium     Status: None   Collection Time: 04/30/23  4:02 AM  Result Value Ref Range   Magnesium 1.8 1.7 - 2.4 mg/dL    I have reviewed pertinent nursing notes, vitals, labs, and images as necessary. I have ordered labwork to follow up on as indicated.  I have reviewed the last notes from staff over past 24 hours. I have discussed patient's care plan and test results with nursing staff, CM/SW, and other staff as appropriate.  Time spent: Greater than 50% of the 55 minute visit was spent in counseling/coordination of care for the patient as laid out in the A&P.   LOS: 4 days   Lewie Chamber, MD [facility] 04/30/2023, 2:46 PM

## 2023-04-30 NOTE — Progress Notes (Signed)
[facility] Progress Note   Subjective:   no new issues, feeling ok today. I/Os yest 1.1/2.2  Objective Vitals:   04/29/23 1922 04/30/23 0039 04/30/23 0426 04/30/23 0715  BP: 120/69 (!) 146/76 (!) 147/81 (!) 179/90  Pulse: 75 76  79  Resp: 20 19 17 18   Temp: (!) 97.4 F (36.3 C) 97.6 F (36.4 C) 98 F (36.7 C) 98 F (36.7 C)  TempSrc: Oral Oral Oral Oral  SpO2: 90% 95% 96% 91%  Weight:  121 kg    Height:       Physical Exam General: obese woman lying comfortably in bed Heart:RRR no rub Lungs: clear to bases Abdomen:soft obese Extremities: 2+ pitting LE edema Neuro:  conversant, awake and alert, grossly nonfocal   Additional Objective Labs: Basic Metabolic Panel: Recent Labs  Lab 04/27/23 0118 04/28/23 0308 04/29/23 0303 04/30/23 0402  NA 139 139 138 137  K 4.7 5.7* 5.1 4.8  CL 109 110 110 107  CO2 21* 22 23 22   GLUCOSE 102* 112* 114* 103*  BUN 45* 49* 52* 56*  CREATININE 3.79* 4.22* 4.26* 4.27*  CALCIUM 8.2* 8.0* 7.8* 8.0*  PHOS 4.1  --   --  4.0   Liver Function Tests: Recent Labs  Lab 04/27/23 0118 04/30/23 0402  AST 23  --   ALT 26  --   ALKPHOS 59  --   BILITOT 0.5  --   PROT 5.4*  --   ALBUMIN 2.6* 2.3*   No results for input(s): "LIPASE", "AMYLASE" in the last 168 hours. CBC: Recent Labs  Lab 04/26/23 2345 04/27/23 0118 04/28/23 0308 04/29/23 0303 04/29/23 1732 04/30/23 0402  WBC 3.6* 3.6* 4.4 5.3  --  5.3  NEUTROABS  --   --  3.3 4.1  --  3.9  HGB 7.4* 7.4* 7.1* 7.0* 7.9* 7.5*  HCT 24.5* 24.7* 23.6* 23.3* 25.9* 25.0*  MCV 90.4 91.1 91.5 91.4  --  89.9  PLT 243 239 217 206  --  225   Blood Culture No results found for: "SDES", "SPECREQUEST", "CULT", "REPTSTATUS"  Cardiac Enzymes: No results for input(s): "CKTOTAL", "CKMB", "CKMBINDEX", "TROPONINI" in the last 168 hours. CBG: No results for input(s): "GLUCAP" in the last 168 hours. Iron Studies:  Recent Labs    04/28/23 1126  IRON 28  TIBC 312  FERRITIN 31    @lablastinr3 @ Studies/Results: No results found. Medications:  furosemide 120 mg (04/30/23 1610)   iron sucrose 200 mg (04/29/23 1524)    sodium chloride   Intravenous Once   amLODipine  10 mg Oral Daily   aspirin EC  81 mg Oral Daily   atorvastatin  40 mg Oral Daily   carvedilol  12.5 mg Oral BID WC   darbepoetin (ARANESP) injection - NON-DIALYSIS  60 mcg Subcutaneous Q Tue-1800   enoxaparin (LOVENOX) injection  30 mg Subcutaneous Q24H   isosorbide mononitrate  120 mg Oral Daily   sodium chloride flush  3 mL Intravenous Q12H    Assessment/Recommendations: Joanna [name] is a/an 52 y.o. female with a past medical history diastolic heart failure, intellectual disability, CKD, HTN, morbid obesity, DM2 who presents with acute heart failure exacerbation   CKD IV: BL Crt appears to be around 3.7-4. Running slightly worse in the past 48h in setting of diuresis and modest hypotension.  UA 1+ protein as priors. UP/C 2.3, presumed DKD. -Hypertension and heart failure management as below -Continue to monitor daily Cr, Dose meds for GFR -Monitor Daily I/Os, Daily weight  -  Maintain MAP>65 for optimal renal perfusion.  -Avoid nephrotoxic medications including NSAIDs -Use synthetic opioids (Fentanyl/Dilaudid) if needed -Currently no indication for HD   Acute heart failure exacerbation: Recent echocardiogram with normal ejection fraction.  Massively volume overloaded with extensive lower extremity edema.  Cont Lasix to 120 mg IV twice daily - will plan to switch to oral diuretics in the next 1-2 days.  Continue to follow weights daily and monitor ins and outs closely.  Low sodium diet, fluid restriction.    Hypertension: History of such.  Some low blood pressure early in admission and stopped hydralazine.  On amlodipine and carvedilol and Imdur but consider stopping if needed.   Acute hypoxic respiratory failure: Diuresis as above --> now on RA   Hyperkalemia: resolved   Anemia, normocytic:   Likely multifactorial. Hb in 7s, iron sat 9%.  Started IV iron and ESA.    Uncontrolled Diabetes Mellitus Type 2 with Hyperglycemia: mgmt per primary  Remain inpt.  Estill Bakes MD 04/30/2023, 9:05 AM  [facility] Pager: [phone]

## 2023-04-30 NOTE — Care Management Important Message (Signed)
Important Message  Patient Details  Name: Joanna [name] MRN: [id number] Date of Birth: [birth year]   Important Message Given:  Yes - Medicare IM     Dorena Bodo 04/30/2023, 12:41 PM

## 2023-04-30 NOTE — Plan of Care (Signed)

## 2023-04-30 NOTE — Progress Notes (Signed)
Heart Failure Navigator Progress Note  Assessed for Heart & Vascular TOC clinic readiness.  Patient does not meet criteria due to EF 50-55%, AKI on CKD IV, is seen by [facility]. .   Navigator will sign off at this time.   Rhae Hammock, BSN, Scientist, clinical (histocompatibility and immunogenetics) Only

## 2023-05-01 DIAGNOSIS — D649 Anemia, unspecified: Secondary | ICD-10-CM

## 2023-05-01 DIAGNOSIS — N184 Chronic kidney disease, stage 4 (severe): Secondary | ICD-10-CM | POA: Diagnosis not present

## 2023-05-01 DIAGNOSIS — F79 Unspecified intellectual disabilities: Secondary | ICD-10-CM | POA: Diagnosis not present

## 2023-05-01 DIAGNOSIS — E119 Type 2 diabetes mellitus without complications: Secondary | ICD-10-CM

## 2023-05-01 DIAGNOSIS — I5033 Acute on chronic diastolic (congestive) heart failure: Secondary | ICD-10-CM | POA: Diagnosis not present

## 2023-05-01 DIAGNOSIS — E66813 Obesity, class 3: Secondary | ICD-10-CM

## 2023-05-01 LAB — CBC WITH DIFFERENTIAL/PLATELET
Abs Immature Granulocytes: 0.02 10*3/uL (ref 0.00–0.07)
Basophils Absolute: 0.1 10*3/uL (ref 0.0–0.1)
Basophils Relative: 1 %
Eosinophils Absolute: 0.2 10*3/uL (ref 0.0–0.5)
Eosinophils Relative: 3 %
HCT: 26.3 % — ABNORMAL LOW (ref 36.0–46.0)
Hemoglobin: 8 g/dL — ABNORMAL LOW (ref 12.0–15.0)
Immature Granulocytes: 0 %
Lymphocytes Relative: 8 %
Lymphs Abs: 0.4 10*3/uL — ABNORMAL LOW (ref 0.7–4.0)
MCH: 27.2 pg (ref 26.0–34.0)
MCHC: 30.4 g/dL (ref 30.0–36.0)
MCV: 89.5 fL (ref 80.0–100.0)
Monocytes Absolute: 0.6 10*3/uL (ref 0.1–1.0)
Monocytes Relative: 12 %
Neutro Abs: 3.9 10*3/uL (ref 1.7–7.7)
Neutrophils Relative %: 76 %
Platelets: 246 10*3/uL (ref 150–400)
RBC: 2.94 MIL/uL — ABNORMAL LOW (ref 3.87–5.11)
RDW: 16.5 % — ABNORMAL HIGH (ref 11.5–15.5)
WBC: 5.2 10*3/uL (ref 4.0–10.5)
nRBC: 0 % (ref 0.0–0.2)

## 2023-05-01 LAB — BASIC METABOLIC PANEL
Anion gap: 10 (ref 5–15)
BUN: 58 mg/dL — ABNORMAL HIGH (ref 6–20)
CO2: 21 mmol/L — ABNORMAL LOW (ref 22–32)
Calcium: 8.1 mg/dL — ABNORMAL LOW (ref 8.9–10.3)
Chloride: 105 mmol/L (ref 98–111)
Creatinine, Ser: 4.32 mg/dL — ABNORMAL HIGH (ref 0.44–1.00)
GFR, Estimated: 12 mL/min — ABNORMAL LOW (ref >60)
Glucose, Bld: 86 mg/dL (ref 70–99)
Potassium: 4.7 mmol/L (ref 3.5–5.1)
Sodium: 136 mmol/L (ref 135–145)

## 2023-05-01 LAB — MAGNESIUM: Magnesium: 1.8 mg/dL (ref 1.7–2.4)

## 2023-05-01 MED ORDER — TORSEMIDE 100 MG PO TABS
100.0000 mg | ORAL_TABLET | Freq: Two times a day (BID) | ORAL | Status: DC
  Administered 2023-05-01 – 2023-05-03 (×4): 100 mg via ORAL
  Filled 2023-05-01 (×4): qty 1

## 2023-05-01 MED ORDER — MAGNESIUM SULFATE 2 GM/50ML IV SOLN
2.0000 g | Freq: Once | INTRAVENOUS | Status: AC
  Administered 2023-05-01: 2 g via INTRAVENOUS
  Filled 2023-05-01: qty 50

## 2023-05-01 NOTE — Evaluation (Signed)
Physical Therapy Evaluation Patient Details Name: Joanna [name] MRN: [id number] DOB: 1970-12-02 Today's Date: 05/01/2023  History of Present Illness  Patient is a 52 year old female presenting with shortness of breath and LE edema. Found to have acute on chronic diastolic CHF. History of intellectual disability  Clinical Impression  Patient is cooperative and pleasant throughout session. She reports she lives in an apartment with family members. She is independent at baseline with mobility without assistive device for ambulation.  Today, patient was able to stand and ambulate in hallway using rolling walker with CGA for safety. Mild dyspnea with exertion and one standing rest break required during walk. Education for energy conservation. Patient ambulated a short distance in the room without rolling walker and was reaching out for furniture for support. Recommend to continue using rolling walker for safety at this time. Sp02 briefly down to 89%, but was mostly 90-94% with hallway ambulation on room air. Recommend to continue PT to maximize independence. She reports she was already getting HHPT, recommend to resume at discharge.       If plan is discharge home, recommend the following: Assist for transportation;Help with stairs or ramp for entrance   Can travel by private vehicle        Equipment Recommendations Rolling walker (2 wheels)  Recommendations for Other Services       Functional Status Assessment Patient has had a recent decline in their functional status and demonstrates the ability to make significant improvements in function in a reasonable and predictable amount of time.     Precautions / Restrictions Precautions Precautions: Fall Restrictions Weight Bearing Restrictions: No      Mobility  Bed Mobility Overal bed mobility: Needs Assistance Bed Mobility: Supine to Sit     Supine to sit: Supervision     General bed mobility comments: increased time, no physical  assistance    Transfers Overall transfer level: Needs assistance Equipment used: Rolling walker (2 wheels) Transfers: Sit to/from Stand Sit to Stand: Contact guard assist                Ambulation/Gait Ambulation/Gait assistance: Contact guard assist Gait Distance (Feet): 120 Feet (and 15 ft) Assistive device: Rolling walker (2 wheels), None Gait Pattern/deviations: Step-through pattern, Decreased stride length Gait velocity: decreased     General Gait Details: Sp02 down to 89% briefly, otherwise 90-94% on room air with ambulation. Cues for energy conservation and taking rest break as needed with mobility. one standing rest break required. patient ambulated 15 ft without rolling walker and was reaching out for furniture for support. rolling walker recommended at this time for safety  Stairs            Wheelchair Mobility     Tilt Bed    Modified Rankin (Stroke Patients Only)       Balance Overall balance assessment: Needs assistance Sitting-balance support: Feet supported Sitting balance-Leahy Scale: Good     Standing balance support: Single extremity supported Standing balance-Leahy Scale: Fair                               Pertinent Vitals/Pain Pain Assessment Pain Assessment: No/denies pain    Home Living Family/patient expects to be discharged to:: Private residence Living Arrangements: Other (Comment);Other relatives (sister) Available Help at Discharge: Family;Available PRN/intermittently Type of Home: Apartment Home Access: Stairs to enter   Entrance Stairs-Number of Steps: 3   Home Layout: One level Home Equipment:  None      Prior Function Prior Level of Function : Independent/Modified Independent             Mobility Comments: Independent with ambulation.       Extremity/Trunk Assessment   Upper Extremity Assessment Upper Extremity Assessment: Overall WFL for tasks assessed    Lower Extremity  Assessment Lower Extremity Assessment: Overall WFL for tasks assessed       Communication   Communication Communication: No apparent difficulties  Cognition Arousal: Alert Behavior During Therapy: WFL for tasks assessed/performed Overall Cognitive Status: Within Functional Limits for tasks assessed                                 General Comments: History of intellectual disability. Patient is grossly oriented and able to follow single step commands without difficulty.  The patient is very particular about her personal items in the room. Occasional cues to stay focused on tasks        General Comments General comments (skin integrity, edema, etc.): mild dyspnea with exertion.    Exercises     Assessment/Plan    PT Assessment Patient needs continued PT services  PT Problem List Decreased activity tolerance;Decreased balance;Decreased mobility;Cardiopulmonary status limiting activity       PT Treatment Interventions DME instruction;Gait training;Functional mobility training;Stair training;Therapeutic activities;Therapeutic exercise;Balance training;Neuromuscular re-education;Cognitive remediation;Patient/family education    PT Goals (Current goals can be found in the Care Plan section)  Acute Rehab PT Goals Patient Stated Goal: to return home PT Goal Formulation: With patient Time For Goal Achievement: 05/08/23 Potential to Achieve Goals: Good    Frequency Min 1X/week     Co-evaluation               AM-PAC PT "6 Clicks" Mobility  Outcome Measure Help needed turning from your back to your side while in a flat bed without using bedrails?: None Help needed moving from lying on your back to sitting on the side of a flat bed without using bedrails?: A Little Help needed moving to and from a bed to a chair (including a wheelchair)?: A Little Help needed standing up from a chair using your arms (e.g., wheelchair or bedside chair)?: A Little Help needed to  walk in hospital room?: A Little Help needed climbing 3-5 steps with a railing? : A Little 6 Click Score: 19    End of Session   Activity Tolerance: Patient tolerated treatment well Patient left: in chair;with call bell/phone within reach Nurse Communication: Mobility status PT Visit Diagnosis: Muscle weakness (generalized) (M62.81);Unsteadiness on feet (R26.81)    Time: 4098-1191 PT Time Calculation (min) (ACUTE ONLY): 30 min   Charges:   PT Evaluation $PT Eval Low Complexity: 1 Low   PT General Charges $$ ACUTE PT VISIT: 1 Visit         Donna Bernard, PT, MPT  [employer] [employer] 05/01/2023, 2:06 PM

## 2023-05-01 NOTE — Assessment & Plan Note (Addendum)
AKI, Hyperkalemia  Patient had aggressive diuresis with loop diuretics.  At the time of her discharge her volume status has improved.  Her serum cr has stabilized at 4,4 to 4,5, his K today is 4,3 and serum bicarbonate at 22.  Na 136 and BUN 66.   Patient will need close follow up as outpatient.  Continue loop diuretic therapy with torsemide 100 mg po bid.   Anemia of chronic renal disease. Anemia of iron deficiency.  Follow up Hgb is 7,5  SP IV iron, (iron sucrose 200 mg x 5 doses).  Continue with EPO per nephrology schedule.

## 2023-05-01 NOTE — Assessment & Plan Note (Addendum)
Echocardiogram with preserved LV systolic function with EF 50 to 55%, moderate LVH, RV with moderate reduction in systolic function, RV with moderate enlargement, RVSP 48,2 mmHg, LA with severe dilatation, RA with moderate dilatation.  Acute on chronic core pulmonale Pulmonary hypertension.  Patient was placed on IV furosemide for diuresis, negative fluid balance was achieved, -9,933 ml, with significant improvement in her symptoms.   Plan to continue diuresis with oral torsemide 100 mg po bid. Blood pressure control with amlodipine, carvedilol, isosorbide.   Acute hypoxemic respiratory failure due to acute cardiogenic pulmonary edema and pulmonary hypertension.  02 saturation on room air at rest is 100%, and on ambulation down to 86%, improved to 96% on 2 L/min per [place].  Will arrange for home supplemental -02 per [place].

## 2023-05-01 NOTE — Plan of Care (Signed)
Pt needs reinforcement regarding progression as pt has intellectual delay.

## 2023-05-01 NOTE — Progress Notes (Addendum)
Progress Note   Patient: Joanna [name] ZOX:[id number] DOB: 10-14-1970 DOA: 04/26/2023     5 DOS: the patient was seen and examined on 05/01/2023   Brief hospital course: Mrs. Inglis was admitted to the hospital with the working diagnosis of acute on chronic diastolic heart failure decompensation.  52 y.o. female with PMH IDD, dCHF, CKD who presented with worsening LE edema and SOB.  She had developed worsening symptoms after holding Lasix at home and not resuming.  On workup she was also found to be hypoxic at 88% on room air and was placed on oxygen.   Patient last hospitalization seems to have ended on February 04, 2023 when she was treated for hypertensive urgency as well as acute right MCA ischemic stroke.   She was admitted for diuresis and monitoring of renal function while resuming.  Assessment and Plan: * Acute on chronic diastolic CHF (congestive heart failure) (HCC) Echocardiogram with preserved LV systolic function with EF 50 to 55%, moderate LVH, RV with moderate reduction in systolic function, RV with moderate enlargement, RVSP 48,2 mmHg, LA with severe dilatation, RA with moderate dilatation.  Urine output is 4,400 ml Systolic blood pressure 115 to 125 mmHg.   Plan to continue diuresis with oral torsemide 100 mg po bid. Blood pressure control with amlodipine, carvedilol, isosorbide.   Acute hypoxemic respiratory failure due to acute cardiogenic pulmonary edema.  At rest 02 saturation is 97%  Will do ambulatory oxymetry on room air prior to her discharge.   CKD (chronic kidney disease) stage 4, GFR 15-29 ml/min (HCC) Hyperkalemia  Renal function with serum cr at 4,32 with K at 4,7 and serum bicarbonate at 21 Na 136 and Mg 1,8   Plan to continue diuresis with torsemide. Add 2 g mag sulfate. Follow up renal function and electrolytes in am.   Intellectual disability - noted; at baseline - [name] the nephew listed in epic appears to be the [facility] and sounds like the POA; [name]  listed as sister is not actually her sister but someone patient calls her sister  Normocytic anemia Anemia of chronic disease, with stable cell count. Iron deficiency anemia on IV iron scheduled for 5 doses total.  Continue EPO.   HTN (hypertension) Continue blood pressure control with carvedilol, isosorbide and amlodipine.   Type 2 diabetes mellitus without complication (HCC) Glucose has been controlled, currently off insulin therapy.  Obesity, class 3 Calculated BMI is 48,3         Subjective: Patient with no chest pain, dyspnea and edema continue to improve.   Physical Exam: Vitals:   05/01/23 0847 05/01/23 0919 05/01/23 1149 05/01/23 1541  BP: 133/66  115/63 125/70  Pulse: 74  76 73  Resp: 20 14 16 20   Temp: 98.1 F (36.7 C)  97.9 F (36.6 C) 97.8 F (36.6 C)  TempSrc: Oral  Oral Oral  SpO2: 96%  96% 97%  Weight:      Height:       Neurology awake and alert ENT with mild pallor Cardiovascular with S1 and S2 present and regular with no gallops, rubs or murmurs Respiratory with no rales or wheezing, no rhonchi Abdomen with no distention  Positive lower extremity edema + to ++ non pitting  Data Reviewed:    Family Communication: no family at the bedside   Disposition: Status is: Inpatient Remains inpatient appropriate because: diuresing   Planned Discharge Destination: Home      Author: Coralie Keens, MD 05/01/2023 3:46 PM  For on  call review [email or web address].

## 2023-05-01 NOTE — TOC Initial Note (Signed)
Transition of Care [facility]) - Initial/Assessment Note    Patient Details  Name: Joanna [name] MRN: [id number] Date of Birth: 04/25/1971  Transition of Care [facility]) CM/SW Contact:    Leone Haven, RN Phone Number: 05/01/2023, 3:49 PM  Clinical Narrative:                 From home with sister and nephew, has PCP and insurance on file, states has no HH services in place at this time or DME at home.  States family member will transport them home at [employer] and family is support system, states gets medications from [facility].  Pta self ambulatory. Per pt eval rec HHPT and rolling walker.  NCM offered choice, patient states she has a legal guardian, [name] , her nephew,  to call him to set up [facility].  NCM called [name] , left vm for return call, also called [name].  Awaiting call back to set up [facility] services.  Expected Discharge Plan: Home w Home Health Services Barriers to Discharge: Continued Medical Work up   Patient Goals and CMS Choice Patient states their goals for this hospitalization and ongoing recovery are:: return home   Choice offered to / list presented to : NA      Expected Discharge Plan and Services In-house Referral: NA Discharge Planning Services: CM Consult Post Acute Care Choice: Home Health Living arrangements for the past 2 months: Single Family Home                 DME Arranged: Walker rolling DME Agency: [employer] Date DME Agency Contacted: 05/01/23 Time DME Agency Contacted: [phone] Representative spoke with at DME Agency: Mitch HH Arranged: PT          Prior Living Arrangements/Services Living arrangements for the past 2 months: Single Family Home Lives with:: Siblings Patient language and need for interpreter reviewed:: Yes Do you feel safe going back to the place where you live?: Yes      Need for Family Participation in Patient Care: Yes (Comment) Care giver support system in place?: Yes (comment)   Criminal Activity/Legal Involvement Pertinent to Current  Situation/Hospitalization: No - Comment as needed  Activities of Daily Living   ADL Screening (condition at time of admission) Independently performs ADLs?: Yes (appropriate for developmental age) (Yes, at baseline) Is the patient deaf or have difficulty hearing?: No Does the patient have difficulty seeing, even when wearing glasses/contacts?: No Does the patient have difficulty concentrating, remembering, or making decisions?: Yes  Permission Sought/Granted Permission sought to share information with : Case Manager, Guardian, Family Supports Permission granted to share information with : Yes, Verbal Permission Granted  Share Information with NAME: [name], [name]     Permission granted to share info w Relationship: sister and nephew     Emotional Assessment Appearance:: Appears stated age Attitude/Demeanor/Rapport: Engaged Affect (typically observed): Appropriate Orientation: : Oriented to Place, Oriented to Self, Oriented to  Time, Oriented to Situation Alcohol / Substance Use: Not Applicable Psych Involvement: No (comment)  Admission diagnosis:  Anasarca [R60.1] Acute on chronic congestive heart failure, unspecified heart failure type (HCC) [I50.9] Patient Active Problem List   Diagnosis Date Noted   Obesity, class 3 05/01/2023   Normocytic anemia 04/26/2023   Hyperkalemia 04/26/2023   CKD (chronic kidney disease) stage 4, GFR 15-29 ml/min (HCC) 04/26/2023   Hypertensive urgency, malignant 01/28/2023   Back pain 01/28/2023   Pain due to onychomycosis of toenails of both feet 11/16/2020   HTN (hypertension) 12/16/2019   Type  2 diabetes mellitus without complication (HCC) 12/16/2019   Acute on chronic diastolic CHF (congestive heart failure) (HCC) 12/16/2019   AKI (acute kidney injury) (HCC) 12/16/2019   New onset of congestive heart failure (HCC) 12/16/2019   Obesity, Class III, BMI 40-49.9 (morbid obesity) (HCC) 12/16/2019   Depression 12/16/2019   Intellectual  disability 12/16/2019   Acute diastolic heart failure (HCC) 12/16/2019   Acute respiratory failure with hypoxia (HCC) 12/16/2019   Hypertensive urgency 12/16/2019   PCP:  Hillery Aldo, NP Pharmacy:   [facility] - [place] [address] [place] [address] Phone: [phone] Fax: [phone]  [facility] [address] [place] [address] Phone: [phone] Fax: [phone]  [facility] [address] [place] [address] Phone: [phone] Fax: [phone]     Social Determinants of Health (SDOH) Social History: SDOH Screenings   Food Insecurity: No Food Insecurity (04/27/2023)  Housing: Low Risk  (04/27/2023)  Transportation Needs: No Transportation Needs (04/27/2023)  Utilities: Not At Risk (04/27/2023)  Financial Resource Strain: Medium Risk (11/30/2022)   Received from [facility]  Physical Activity: Insufficiently Active (11/30/2022)   Received from [facility]  Social Connections: Socially Integrated (11/30/2022)   Received from [facility]  Stress: No Stress Concern Present (11/30/2022)   Received from [facility]  Tobacco Use: Low Risk  (04/26/2023)   SDOH Interventions:     Readmission Risk Interventions    05/01/2023    3:32 PM  Readmission Risk Prevention Plan  Transportation Screening Complete  PCP or Specialist Appt within 3-5 Days Complete  HRI or Home Care Consult Complete  Palliative Care Screening Not Applicable  Medication Review (RN Care Manager) Complete

## 2023-05-01 NOTE — Assessment & Plan Note (Signed)
Calculated BMI is 48,3

## 2023-05-01 NOTE — Progress Notes (Signed)
[facility] Progress Note   Subjective:   no new issues, feeling ok today. I/Os yest 0.4 / 4.4.   Objective Vitals:   05/01/23 0014 05/01/23 0434 05/01/23 0847 05/01/23 0919  BP: 126/60 (!) 132/59 133/66   Pulse: 72 71 74   Resp: 16 19 20 14   Temp: 97.6 F (36.4 C) 97.6 F (36.4 C) 98.1 F (36.7 C)   TempSrc: Oral Oral Oral   SpO2: 97% 91% 96%   Weight:  120 kg    Height:       Physical Exam General: obese woman lying comfortably in bed Heart:RRR no rub Lungs: clear to bases Abdomen:soft obese Extremities: 1+ pitting LE edema no longer tense Neuro:  conversant, awake and alert, grossly nonfocal   Additional Objective Labs: Basic Metabolic Panel: Recent Labs  Lab 04/27/23 0118 04/28/23 0308 04/29/23 0303 04/30/23 0402 05/01/23 0315  NA 139   < > 138 137 136  K 4.7   < > 5.1 4.8 4.7  CL 109   < > 110 107 105  CO2 21*   < > 23 22 21*  GLUCOSE 102*   < > 114* 103* 86  BUN 45*   < > 52* 56* 58*  CREATININE 3.79*   < > 4.26* 4.27* 4.32*  CALCIUM 8.2*   < > 7.8* 8.0* 8.1*  PHOS 4.1  --   --  4.0  --    < > = values in this interval not displayed.   Liver Function Tests: Recent Labs  Lab 04/27/23 0118 04/30/23 0402  AST 23  --   ALT 26  --   ALKPHOS 59  --   BILITOT 0.5  --   PROT 5.4*  --   ALBUMIN 2.6* 2.3*   No results for input(s): "LIPASE", "AMYLASE" in the last 168 hours. CBC: Recent Labs  Lab 04/27/23 0118 04/28/23 0308 04/29/23 0303 04/29/23 1732 04/30/23 0402 05/01/23 0315  WBC 3.6* 4.4 5.3  --  5.3 5.2  NEUTROABS  --  3.3 4.1  --  3.9 3.9  HGB 7.4* 7.1* 7.0* 7.9* 7.5* 8.0*  HCT 24.7* 23.6* 23.3* 25.9* 25.0* 26.3*  MCV 91.1 91.5 91.4  --  89.9 89.5  PLT 239 217 206  --  225 246   Blood Culture No results found for: "SDES", "SPECREQUEST", "CULT", "REPTSTATUS"  Cardiac Enzymes: No results for input(s): "CKTOTAL", "CKMB", "CKMBINDEX", "TROPONINI" in the last 168 hours. CBG: No results for input(s): "GLUCAP" in the last  168 hours. Iron Studies:  Recent Labs    04/28/23 1126  IRON 28  TIBC 312  FERRITIN 31   @lablastinr3 @ Studies/Results: No results found. Medications:  furosemide 120 mg (05/01/23 8119)   iron sucrose 200 mg (04/30/23 1603)    sodium chloride   Intravenous Once   amLODipine  10 mg Oral Daily   aspirin EC  81 mg Oral Daily   atorvastatin  40 mg Oral Daily   carvedilol  12.5 mg Oral BID WC   darbepoetin (ARANESP) injection - NON-DIALYSIS  60 mcg Subcutaneous Q Tue-1800   enoxaparin (LOVENOX) injection  30 mg Subcutaneous Q24H   isosorbide mononitrate  120 mg Oral Daily   sodium chloride flush  3 mL Intravenous Q12H    Assessment/Recommendations: Joanna [name] is a/an 52 y.o. female with a past medical history diastolic heart failure, intellectual disability, CKD, HTN, morbid obesity, DM2 who presents with acute heart failure exacerbation   CKD IV: BL Crt appears to be around  3.7-4. Near baseline thankfully in setting of diuresis.  UA 1+ protein as priors. UP/C 2.3, presumed DKD. -Hypertension and heart failure management as below -Continue to monitor daily Cr, Dose meds for GFR -Monitor Daily I/Os, Daily weight  -Maintain MAP>65 for optimal renal perfusion.  -Avoid nephrotoxic medications including NSAIDs -Use synthetic opioids (Fentanyl/Dilaudid) if needed -Currently no indication for HD -I follow at [facility] clinic but she hasn't been seen since 2022 --> will need close f/u arranged prior to dc/   Acute heart failure exacerbation: Recent echocardiogram with normal ejection fraction.  Massively volume overloaded with extensive lower extremity edema.  Has been diuresing on Lasix to 120 mg IV twice daily - rec'd IV dose this am switch to torsemide 100 po BID today - needs ongoing diuresis but given improvement in volume hopefully can continue more gently with orals now.  Would rec remain inpt to ensure can continue diuresis with orals.  Continue to follow weights daily and monitor ins and  outs closely.  Low sodium diet, fluid restriction.    Hypertension: History of such.  Some low blood pressure early in admission and stopped hydralazine.  On amlodipine and carvedilol and Imdur but consider stopping if needed.   Acute hypoxic respiratory failure: Diuresis as above --> now on RA   Hyperkalemia: resolved   Anemia, normocytic:  Likely multifactorial. Hb in 7s, iron sat 9%.  Started IV iron and ESA.    Uncontrolled Diabetes Mellitus Type 2 with Hyperglycemia: mgmt per primary  Remain inpt.  Hopefully ok to d/c tomorrow.   Estill Bakes MD 05/01/2023, 10:48 AM  [facility] Pager: [phone]

## 2023-05-01 NOTE — Evaluation (Signed)
Occupational Therapy Evaluation Patient Details Name: Joanna [name] MRN: [id number] DOB: 12-08-1970 Today's Date: 05/01/2023   History of Present Illness Patient is a 52 year old female presenting with shortness of breath and LE edema. Found to have acute on chronic diastolic CHF. History of intellectual disability   Clinical Impression   PTA, pt lived with sister and nephew and was mod I for ADL; family assisted with medication management, community IADL, and transportation. Pt with decreased activity tolerance, but otherwise suspect near baseline. OT to follow acutely to optimize return to PLOF, but do not suspect need for follow up OT after discharge.       If plan is discharge home, recommend the following: A little help with walking and/or transfers;A little help with bathing/dressing/bathroom;Assistance with cooking/housework;Direct supervision/assist for financial management;Direct supervision/assist for medications management;Assist for transportation;Help with stairs or ramp for entrance    Functional Status Assessment  Patient has had a recent decline in their functional status and demonstrates the ability to make significant improvements in function in a reasonable and predictable amount of time.  Equipment Recommendations  Tub/shower seat    Recommendations for Other Services       Precautions / Restrictions Precautions Precautions: Fall Restrictions Weight Bearing Restrictions: No      Mobility Bed Mobility Overal bed mobility: Needs Assistance Bed Mobility: Supine to Sit     Supine to sit: Supervision     General bed mobility comments: increased time, no physical assistance    Transfers Overall transfer level: Needs assistance Equipment used: Rolling walker (2 wheels) Transfers: Sit to/from Stand Sit to Stand: Contact guard assist                  Balance Overall balance assessment: Needs assistance Sitting-balance support: Feet supported Sitting  balance-Leahy Scale: Good     Standing balance support: Single extremity supported Standing balance-Leahy Scale: Fair                             ADL either performed or assessed with clinical judgement   ADL Overall ADL's : Needs assistance/impaired Eating/Feeding: Modified independent   Grooming: Contact guard assist;Standing   Upper Body Bathing: Set up;Sitting   Lower Body Bathing: Contact guard assist;Sit to/from stand   Upper Body Dressing : Set up;Sitting Upper Body Dressing Details (indicate cue type and reason): new gown Lower Body Dressing: Sit to/from stand;Minimal assistance   Toilet Transfer: Contact guard assist;Ambulation;Rolling walker (2 wheels)   Toileting- Clothing Manipulation and Hygiene: Contact guard assist;Sit to/from stand Toileting - Clothing Manipulation Details (indicate cue type and reason): approaching supervision. Pt reporting soiled on arrival so focus of session on LB bathing and pericare. Pt donned her own antifungal powder and barrier cream as well without asisst     Functional mobility during ADLs: Contact guard assist;Rolling walker (2 wheels)       Vision Patient Visual Report: No change from baseline Additional Comments: performing word search on arrival. Did need cues to locate 2 words she had left     Perception         Praxis         Pertinent Vitals/Pain Pain Assessment Pain Assessment: No/denies pain     Extremity/Trunk Assessment Upper Extremity Assessment Upper Extremity Assessment: Overall WFL for tasks assessed   Lower Extremity Assessment Lower Extremity Assessment: Overall WFL for tasks assessed       Communication Communication Communication: No apparent difficulties  Cognition Arousal: Alert Behavior During Therapy: WFL for tasks assessed/performed Overall Cognitive Status: Within Functional Limits for tasks assessed                                 General Comments: History  of intellectual disability. Patient is grossly oriented and able to follow single step commands without difficulty. Occasional cues to stay focused on tasks. Asking OT's name 4x during session     General Comments  mild increased work of breathing with LB bathing, but pt denies feeling short of breath or winded.    Exercises     Shoulder Instructions      Home Living Family/patient expects to be discharged to:: Private residence Living Arrangements: Other (Comment);Other relatives (sister) Available Help at Discharge: Family;Available PRN/intermittently Type of Home: Apartment Home Access: Stairs to enter Entrance Stairs-Number of Steps: 3   Home Layout: One level     Bathroom Shower/Tub: Tub/shower unit         Home Equipment: None          Prior Functioning/Environment Prior Level of Function : Independent/Modified Independent             Mobility Comments: Independent with ambulation.          OT Problem List: Decreased strength;Decreased activity tolerance;Impaired balance (sitting and/or standing)      OT Treatment/Interventions: Self-care/ADL training;Therapeutic exercise;DME and/or AE instruction;Balance training;Patient/family education;Therapeutic activities    OT Goals(Current goals can be found in the care plan section) Acute Rehab OT Goals Patient Stated Goal: get better OT Goal Formulation: With patient Time For Goal Achievement: 05/15/23 Potential to Achieve Goals: Good  OT Frequency: Min 1X/week    Co-evaluation              AM-PAC OT "6 Clicks" Daily Activity     Outcome Measure Help from another person eating meals?: None Help from another person taking care of personal grooming?: A Little Help from another person toileting, which includes using toliet, bedpan, or urinal?: A Little Help from another person bathing (including washing, rinsing, drying)?: A Little Help from another person to put on and taking off regular upper body  clothing?: A Little Help from another person to put on and taking off regular lower body clothing?: A Little 6 Click Score: 19   End of Session Equipment Utilized During Treatment: Rolling walker (2 wheels) Nurse Communication: Mobility status  Activity Tolerance: Patient tolerated treatment well Patient left: in chair;with call bell/phone within reach  OT Visit Diagnosis: Unsteadiness on feet (R26.81);Muscle weakness (generalized) (M62.81)                Time: 0626-9485 OT Time Calculation (min): 37 min Charges:  OT General Charges $OT Visit: 1 Visit OT Evaluation $OT Eval Moderate Complexity: 1 Mod OT Treatments $Self Care/Home Management : 8-22 mins  Tyler Deis, OTR/L [facility] Acute Rehabilitation Office: [phone]   Myrla Halsted 05/01/2023, 5:06 PM

## 2023-05-02 DIAGNOSIS — E66813 Obesity, class 3: Secondary | ICD-10-CM

## 2023-05-02 DIAGNOSIS — N184 Chronic kidney disease, stage 4 (severe): Secondary | ICD-10-CM | POA: Diagnosis not present

## 2023-05-02 DIAGNOSIS — I5033 Acute on chronic diastolic (congestive) heart failure: Secondary | ICD-10-CM | POA: Diagnosis not present

## 2023-05-02 DIAGNOSIS — I159 Secondary hypertension, unspecified: Secondary | ICD-10-CM

## 2023-05-02 DIAGNOSIS — F79 Unspecified intellectual disabilities: Secondary | ICD-10-CM | POA: Diagnosis not present

## 2023-05-02 DIAGNOSIS — E119 Type 2 diabetes mellitus without complications: Secondary | ICD-10-CM | POA: Diagnosis not present

## 2023-05-02 LAB — PROTEIN ELECTROPHORESIS, SERUM
A/G Ratio: 1.2 (ref 0.7–1.7)
Albumin ELP: 3 g/dL (ref 2.9–4.4)
Alpha-1-Globulin: 0.3 g/dL (ref 0.0–0.4)
Alpha-2-Globulin: 0.6 g/dL (ref 0.4–1.0)
Beta Globulin: 0.9 g/dL (ref 0.7–1.3)
Gamma Globulin: 0.9 g/dL (ref 0.4–1.8)
Globulin, Total: 2.6 g/dL (ref 2.2–3.9)
Total Protein ELP: 5.6 g/dL — ABNORMAL LOW (ref 6.0–8.5)

## 2023-05-02 LAB — CBC WITH DIFFERENTIAL/PLATELET
Abs Immature Granulocytes: 0.03 10*3/uL (ref 0.00–0.07)
Basophils Absolute: 0 10*3/uL (ref 0.0–0.1)
Basophils Relative: 1 %
Eosinophils Absolute: 0.1 10*3/uL (ref 0.0–0.5)
Eosinophils Relative: 3 %
HCT: 24.6 % — ABNORMAL LOW (ref 36.0–46.0)
Hemoglobin: 7.5 g/dL — ABNORMAL LOW (ref 12.0–15.0)
Immature Granulocytes: 1 %
Lymphocytes Relative: 9 %
Lymphs Abs: 0.5 10*3/uL — ABNORMAL LOW (ref 0.7–4.0)
MCH: 27.5 pg (ref 26.0–34.0)
MCHC: 30.5 g/dL (ref 30.0–36.0)
MCV: 90.1 fL (ref 80.0–100.0)
Monocytes Absolute: 0.7 10*3/uL (ref 0.1–1.0)
Monocytes Relative: 13 %
Neutro Abs: 3.9 10*3/uL (ref 1.7–7.7)
Neutrophils Relative %: 73 %
Platelets: 232 10*3/uL (ref 150–400)
RBC: 2.73 MIL/uL — ABNORMAL LOW (ref 3.87–5.11)
RDW: 16.9 % — ABNORMAL HIGH (ref 11.5–15.5)
WBC: 5.2 10*3/uL (ref 4.0–10.5)
nRBC: 0 % (ref 0.0–0.2)

## 2023-05-02 LAB — BASIC METABOLIC PANEL
Anion gap: 6 (ref 5–15)
BUN: 59 mg/dL — ABNORMAL HIGH (ref 6–20)
CO2: 22 mmol/L (ref 22–32)
Calcium: 7.9 mg/dL — ABNORMAL LOW (ref 8.9–10.3)
Chloride: 105 mmol/L (ref 98–111)
Creatinine, Ser: 4.46 mg/dL — ABNORMAL HIGH (ref 0.44–1.00)
GFR, Estimated: 11 mL/min — ABNORMAL LOW (ref >60)
Glucose, Bld: 135 mg/dL — ABNORMAL HIGH (ref 70–99)
Potassium: 4.2 mmol/L (ref 3.5–5.1)
Sodium: 133 mmol/L — ABNORMAL LOW (ref 135–145)

## 2023-05-02 LAB — MAGNESIUM: Magnesium: 2 mg/dL (ref 1.7–2.4)

## 2023-05-02 NOTE — TOC Progression Note (Signed)
Transition of Care [facility]) - Progression Note    Patient Details  Name: Joanna [name] MRN: [id number] Date of Birth: 1971/02/06  [facility] CM/SW Contact  Leone Haven, RN Phone Number: 05/02/2023, 11:16 AM  Clinical Narrative:    Per Dahlia Client , has no preference for agencies, NCM made referral to [facility] with Frances Furbish. He is able to take referral. Soc will begin 24 to 48 hrs post dc.  [employer] to supply the walker to room.    Expected Discharge Plan: Home w Home Health Services Barriers to Discharge: Continued Medical Work up  Expected Discharge Plan and Services In-house Referral: NA Discharge Planning Services: CM Consult Post Acute Care Choice: Home Health Living arrangements for the past 2 months: Single Family Home                 DME Arranged: Walker rolling DME Agency: [employer] Date DME Agency Contacted: 05/01/23 Time DME Agency Contacted: 1544 Representative spoke with at DME Agency: Marthann Schiller HH Arranged: PT HH Agency: [facility] Date [facility] Agency Contacted: 05/02/23 Time HH Agency Contacted: 1113 Representative spoke with at [facility] Agency: Kandee Keen   Social Determinants of Health (SDOH) Interventions SDOH Screenings   Food Insecurity: No Food Insecurity (04/27/2023)  Housing: Low Risk  (04/27/2023)  Transportation Needs: No Transportation Needs (04/27/2023)  Utilities: Not At Risk (04/27/2023)  Financial Resource Strain: Medium Risk (11/30/2022)   Received from [facility]  Physical Activity: Insufficiently Active (11/30/2022)   Received from [facility]  Social Connections: Socially Integrated (11/30/2022)   Received from [facility]  Stress: No Stress Concern Present (11/30/2022)   Received from [facility]  Tobacco Use: Low Risk  (04/26/2023)    Readmission Risk Interventions    05/01/2023    3:32 PM  Readmission Risk Prevention Plan  Transportation Screening Complete  PCP or Specialist Appt within 3-5 Days Complete  HRI or Home  Care Consult Complete  Palliative Care Screening Not Applicable  Medication Review (RN Care Manager) Complete

## 2023-05-02 NOTE — Progress Notes (Signed)
Joanna [name]   Subjective:   no new issues, feeling ok today. I/Os yest 0.48 / 1.7. Cr 4.3 > 4.4.   Objective Vitals:   05/01/23 2013 05/02/23 0011 05/02/23 0305 05/02/23 0720  BP: 130/69 129/66 (!) 148/71 (!) 142/82  Pulse: 80 73 72 74  Resp: 16 17 20 20   Temp: 97.8 F (36.6 C) 97.9 F (36.6 C) 97.9 F (36.6 C) 97.9 F (36.6 C)  TempSrc: Oral Oral Oral Oral  SpO2: 96% 95% 92% 93%  Weight:   117.5 kg   Height:       Physical Exam General: obese woman lying comfortably in bed Heart:RRR no rub Lungs: clear to bases Abdomen:soft obese Extremities: 1+ pitting LE edema no longer tense Neuro:  conversant, awake and alert, grossly nonfocal   Additional Objective Labs: Basic Metabolic Panel: Recent Labs  Lab 04/27/23 0118 04/28/23 0308 04/30/23 0402 05/01/23 0315 05/02/23 0339  NA 139   < > 137 136 133*  K 4.7   < > 4.8 4.7 4.2  CL 109   < > 107 105 105  CO2 21*   < > 22 21* 22  GLUCOSE 102*   < > 103* 86 135*  BUN 45*   < > 56* 58* 59*  CREATININE 3.79*   < > 4.27* 4.32* 4.46*  CALCIUM 8.2*   < > 8.0* 8.1* 7.9*  PHOS 4.1  --  4.0  --   --    < > = values in this interval not displayed.   Liver Function Tests: Recent Labs  Lab 04/27/23 0118 04/30/23 0402  AST 23  --   ALT 26  --   ALKPHOS 59  --   BILITOT 0.5  --   PROT 5.4*  --   ALBUMIN 2.6* 2.3*   No results for input(s): "LIPASE", "AMYLASE" in the last 168 hours. CBC: Recent Labs  Lab 04/28/23 0308 04/29/23 0303 04/29/23 1732 04/30/23 0402 05/01/23 0315 05/02/23 0339  WBC 4.4 5.3  --  5.3 5.2 5.2  NEUTROABS 3.3 4.1  --  3.9 3.9 3.9  HGB 7.1* 7.0*   < > 7.5* 8.0* 7.5*  HCT 23.6* 23.3*   < > 25.0* 26.3* 24.6*  MCV 91.5 91.4  --  89.9 89.5 90.1  PLT 217 206  --  225 246 232   < > = values in this interval not displayed.   Blood Culture No results found for: "SDES", "SPECREQUEST", "CULT", "REPTSTATUS"  Cardiac Enzymes: No results for input(s): "CKTOTAL", "CKMB",  "CKMBINDEX", "TROPONINI" in the last 168 hours. CBG: No results for input(s): "GLUCAP" in the last 168 hours. Iron Studies:  No results for input(s): "IRON", "TIBC", "TRANSFERRIN", "FERRITIN" in the last 72 hours.  @lablastinr3 @ Studies/Results: No results found. Medications:    sodium chloride   Intravenous Once   amLODipine  10 mg Oral Daily   aspirin EC  81 mg Oral Daily   atorvastatin  40 mg Oral Daily   carvedilol  12.5 mg Oral BID WC   darbepoetin (ARANESP) injection - NON-DIALYSIS  60 mcg Subcutaneous Q Tue-1800   enoxaparin (LOVENOX) injection  30 mg Subcutaneous Q24H   isosorbide mononitrate  120 mg Oral Daily   sodium chloride flush  3 mL Intravenous Q12H   torsemide  100 mg Oral BID    Assessment/Recommendations: Joanna [name] is a/an 52 y.o. female with a past medical history diastolic heart failure, intellectual disability, CKD, HTN, morbid obesity, DM2 who presents with acute  heart failure exacerbation   CKD IV: BL Crt appears to be around 3.7-4. Near baseline thankfully in setting of diuresis.  UA 1+ protein as priors. UP/C 2.3, presumed DKD. -Hypertension and heart failure management as below -Continue to monitor daily Cr, Dose meds for GFR -Monitor Daily I/Os, Daily weight  -Maintain MAP>65 for optimal renal perfusion.  -Avoid nephrotoxic medications including NSAIDs -Use synthetic opioids (Fentanyl/Dilaudid) if needed -Currently no indication for HD -I follow at [facility] clinic but she hasn't been seen since 2022 --> will arrange close f/u   Acute heart failure exacerbation: Recent echocardiogram with normal ejection fraction.  On admission massively volume overloaded with extensive lower extremity edema on admission.  By I/Os diuresed 8+L at this point.  Switched to torsemide 100 po BID 11/13 - continue this dose for 3 more days then go to daily dosing. Continue to follow weights daily and monitor ins and outs closely.  Low sodium diet, fluid restriction.     Hypertension: History of such.  Some low blood pressure early in admission and stopped hydralazine.  On amlodipine and carvedilol and Imdur but consider stopping if needed.   Acute hypoxic respiratory failure: Diuresis as above --> now on RA   Anemia, normocytic:  Likely multifactorial. Hb in 7s, iron sat 9%.  Started IV iron and ESA.    Uncontrolled Diabetes Mellitus Type 2 with Hyperglycemia: mgmt per primary  Ok for d/c - I will arrange f/u in nephrology clinic in 1-2 weeks.  She has my card - her sister has brought her to clinic before.  Will sign off please page with questions.   Joanna Bakes MD 05/02/2023, 11:05 AM  [facility] Pager: [phone]

## 2023-05-02 NOTE — Progress Notes (Signed)
Mobility Specialist Progress Note: Nurse requested Mobility Specialist to perform oxygen saturation test with pt which includes removing pt from oxygen both at rest and while ambulating.  Below are the results from that testing.     Patient Saturations on Room Air at Rest = spO2 100%  Patient Saturations on Room Air while Ambulating = sp02 86% .    Patient Saturations on 2 Liters of oxygen while Ambulating = sp02 96%  At end of testing pt left in room on 2  Liters of oxygen.  Reported results to nurse.    Thompson Grayer Mobility Specialist  Please contact vis Secure Chat or  Rehab Office [phone]

## 2023-05-02 NOTE — Plan of Care (Signed)
  Problem: Clinical Measurements: Goal: Ability to maintain clinical measurements within normal limits will improve Outcome: Progressing Goal: Will remain free from infection Outcome: Progressing Goal: Diagnostic test results will improve Outcome: Progressing Goal: Respiratory complications will improve Outcome: Progressing Goal: Cardiovascular complication will be avoided Outcome: Progressing   Problem: Activity: Goal: Risk for activity intolerance will decrease Outcome: Progressing   Problem: Nutrition: Goal: Adequate nutrition will be maintained Outcome: Progressing   Problem: Coping: Goal: Level of anxiety will decrease Outcome: Progressing   Problem: Elimination: Goal: Will not experience complications related to bowel motility Outcome: Progressing Goal: Will not experience complications related to urinary retention Outcome: Progressing   Problem: Pain Management: Goal: General experience of comfort will improve Outcome: Progressing   Problem: Safety: Goal: Ability to remain free from injury will improve Outcome: Progressing   Problem: Skin Integrity: Goal: Risk for impaired skin integrity will decrease Outcome: Progressing   Problem: Education: Goal: Knowledge of General Education information will improve Description: Including pain rating scale, medication(s)/side effects and non-pharmacologic comfort measures Outcome: Not Progressing   Problem: Health Behavior/Discharge Planning: Goal: Ability to manage health-related needs will improve Outcome: Not Progressing

## 2023-05-02 NOTE — Progress Notes (Signed)
Mobility Specialist Progress Note:    05/02/23 1140  Mobility  Activity Ambulated with assistance in hallway  Level of Assistance Contact guard assist, steadying assist  Assistive Device Front wheel walker  Distance Ambulated (ft) 200 ft  Activity Response Tolerated well  Mobility Referral Yes  $Mobility charge 1 Mobility  Mobility Specialist Start Time (ACUTE ONLY) 0912  Mobility Specialist Stop Time (ACUTE ONLY) 0931  Mobility Specialist Time Calculation (min) (ACUTE ONLY) 19 min   Received pt in bed having no complaints and agreeable to mobility. Pt was asymptomatic throughout ambulation and returned to room w/o fault. Left in chair w/ call bell in reach and all needs met.   Thompson Grayer Mobility Specialist  Please contact vis Secure Chat or  Rehab Office [phone]

## 2023-05-02 NOTE — Plan of Care (Signed)

## 2023-05-02 NOTE — Progress Notes (Signed)
SATURATION QUALIFICATIONS: (This note is used to comply with regulatory documentation for home oxygen)  Patient Saturations on Room Air at Rest = 94%  Patient Saturations on Room Air while Ambulating = 86%  Patient Saturations on 2 Liters of oxygen while Ambulating = 97%  Please briefly explain why patient needs home oxygen:  Pt ambulated with mobility specialist.  Oxygen needed during ambulation.

## 2023-05-02 NOTE — Progress Notes (Signed)
Progress Note   Patient: Joanna [name] WUJ:[id number] DOB: [birth year] DOA: 04/26/2023     6 DOS: the patient was seen and examined on 05/02/2023   Brief hospital course: Mrs. Davids was admitted to the hospital with the working diagnosis of acute on chronic diastolic heart failure decompensation.  52 y.o. female with past medical history of T2DM, hypertension, obesity, CKD and cognitive impairment who presented with worsening LE edema and dyspnea.  Recent hospitalization 01/2023 for hypertensive urgency as well as acute right MCA ischemic stroke.  Reported worsening dyspnea and lower extremity edema for the last 7 to 10 days prior to admission. EMS was called and she was found with 02 saturation 88% on room air, she was placed on supplemental 02 and was transported to the ED.  On her initial physical examination her blood pressure was 153/102, HR 77, RR 22 and 02 saturation 99% on supplemental 02.  Lungs with bilateral rales, heart with S1 and S2 present and rhythmic, abdomen with no distention and positive lower extremity edema.   Na 137, K 5.2 CL 109, bicarbonate 20, glucose 83, bun 46 and cr 3,71  BNP 1,342  High sensitive troponin 14 and 14  Wbc 4,2 hgb 7,3 plt 242   Chest radiograph with left rotation, cardiomegaly and bilateral hilar vascular congestion.   EKG 80 bpm, normal axis, normal intervals, sinus rhythm with 1st degree AV block, with no significant ST segment or T wave changes.   Patient has placed on IV furosemide and then transitioned to oral torsemide.   Assessment and Plan: * Acute on chronic diastolic CHF (congestive heart failure) (HCC) Echocardiogram with preserved LV systolic function with EF 50 to 55%, moderate LVH, RV with moderate reduction in systolic function, RV with moderate enlargement, RVSP 48,2 mmHg, LA with severe dilatation, RA with moderate dilatation.  Acute on chronic core pulmonale Pulmonary hypertension.  Urine output is 1,700  ml Systolic blood  pressure 142 to 135 mmHg.   Plan to continue diuresis with oral torsemide 100 mg po bid. Blood pressure control with amlodipine, carvedilol, isosorbide.   Acute hypoxemic respiratory failure due to acute cardiogenic pulmonary edema.  At rest 02 saturation is 97%  Plan for ambulatory oxymetry on room air today.    CKD (chronic kidney disease) stage 4, GFR 15-29 ml/min (HCC) Hyperkalemia  Renal function with serum cr at 4,46 with K at 4,2 and serum bicarbonate at 22.  Na 133 and Mg 2,0   Plan to continue diuresis with torsemide and follow up renal function in am to have a better idea of new baseline serum cr.   Anemia of chronic renal disease. Anemia of iron deficiency.  Follow up Hgb is 7,5  Continue with IV iron, (iron sucrose 200 mg x 5 doses).  Continue with EPO.   Intellectual disability - noted; at baseline - [name] the nephew listed in epic appears to be the [facility] and sounds like the POA; [name] listed as sister is not actually her sister but someone patient calls her sister  HTN (hypertension) Continue blood pressure control with carvedilol, isosorbide and amlodipine.   Type 2 diabetes mellitus without complication (HCC) Glucose has been controlled, currently off insulin therapy.  Obesity, class 3 Calculated BMI is 48,3         Subjective: patient is feeling better, dyspnea and edema continue to improve, no chest pain.   Physical Exam: Vitals:   05/02/23 0011 05/02/23 0305 05/02/23 0720 05/02/23 1110  BP: 129/66 (!) 148/71 (!) 142/82  135/74  Pulse: 73 72 74 81  Resp: 17 20 20 18   Temp: 97.9 F (36.6 C) 97.9 F (36.6 C) 97.9 F (36.6 C) 97.8 F (36.6 C)  TempSrc: Oral Oral Oral Oral  SpO2: 95% 92% 93% 94%  Weight:  117.5 kg    Height:       Neurology awake and alert ENT With mild pallor Cardiovascular with S1 and S2 present and regular with no gallops, rubs or murmurs Respiratory with no rales or wheezing, no rhonchi Abdomen with no distention   Positive non pitting lower extremity edema, thigh edema has improved.  Data Reviewed:    Family Communication: no family at the bedside   Disposition: Status is: Inpatient Remains inpatient appropriate because: follow up renal function in am, check new baseline serum cr   Planned Discharge Destination: Home     Author: Coralie Keens, MD 05/02/2023 12:35 PM  For on call review [email or web address].

## 2023-05-03 ENCOUNTER — Other Ambulatory Visit (HOSPITAL_COMMUNITY): Payer: Self-pay

## 2023-05-03 DIAGNOSIS — I5033 Acute on chronic diastolic (congestive) heart failure: Secondary | ICD-10-CM | POA: Diagnosis not present

## 2023-05-03 DIAGNOSIS — I159 Secondary hypertension, unspecified: Secondary | ICD-10-CM | POA: Diagnosis not present

## 2023-05-03 DIAGNOSIS — F79 Unspecified intellectual disabilities: Secondary | ICD-10-CM | POA: Diagnosis not present

## 2023-05-03 DIAGNOSIS — N184 Chronic kidney disease, stage 4 (severe): Secondary | ICD-10-CM | POA: Diagnosis not present

## 2023-05-03 LAB — RENAL FUNCTION PANEL
Albumin: 2.4 g/dL — ABNORMAL LOW (ref 3.5–5.0)
Anion gap: 10 (ref 5–15)
BUN: 66 mg/dL — ABNORMAL HIGH (ref 6–20)
CO2: 22 mmol/L (ref 22–32)
Calcium: 8.2 mg/dL — ABNORMAL LOW (ref 8.9–10.3)
Chloride: 104 mmol/L (ref 98–111)
Creatinine, Ser: 4.54 mg/dL — ABNORMAL HIGH (ref 0.44–1.00)
GFR, Estimated: 11 mL/min — ABNORMAL LOW (ref >60)
Glucose, Bld: 142 mg/dL — ABNORMAL HIGH (ref 70–99)
Phosphorus: 4.3 mg/dL (ref 2.5–4.6)
Potassium: 4.3 mmol/L (ref 3.5–5.1)
Sodium: 136 mmol/L (ref 135–145)

## 2023-05-03 MED ORDER — TORSEMIDE 100 MG PO TABS
100.0000 mg | ORAL_TABLET | Freq: Two times a day (BID) | ORAL | 0 refills | Status: DC
  Filled 2023-05-03: qty 60, 30d supply, fill #0

## 2023-05-03 NOTE — Progress Notes (Signed)
Physical Therapy Treatment Patient Details Name: Joanna [name] MRN: [id number] DOB: Apr 19, 1971 Today's Date: 05/03/2023   History of Present Illness Patient is a 52 year old female presenting with shortness of breath and LE edema. Found to have acute on chronic diastolic CHF. History of intellectual disability    PT Comments  Pt was able to progress to navigating stairs this date. She required extra time and bil UE support on the handrails, but was able to navigate up/down x6 stairs without LOB, CGA for safety only. Educated pt on activity pacing when ambulating. At this time, she is benefiting from a rollator for energy conservation when ambulating and allowing her a place to sit as needed to rest. Thus, updated DME recs to a rollator. Educated pt on proper use of rollator brakes with transfers for her safety, good carryover noted. Educated pt on reducing sodium and processed foods intake to manage her CHF. She verbalized understanding. She is requiring supplemental O2 to maintain her sats >/= 90% when ambulating. Will continue to follow acutely.     If plan is discharge home, recommend the following: Assist for transportation;Help with stairs or ramp for entrance   Can travel by private vehicle        Equipment Recommendations  Rollator (4 wheels)    Recommendations for Other Services       Precautions / Restrictions Precautions Precautions: Fall Precaution Comments: watch SpO2 Restrictions Weight Bearing Restrictions: No     Mobility  Bed Mobility Overal bed mobility: Needs Assistance Bed Mobility: Supine to Sit     Supine to sit: Supervision, HOB elevated     General bed mobility comments: Supervision for safety exiting L EOB    Transfers Overall transfer level: Needs assistance Equipment used: Rollator (4 wheels) Transfers: Sit to/from Stand Sit to Stand: Contact guard assist           General transfer comment: Pt needed cues for rollator brake application with  transfers, good carryover noted. Pt transferring to stand from EOB 1x and sit <> stand from rollator seat 1x. No LOB, CGA for safety    Ambulation/Gait Ambulation/Gait assistance: Contact guard assist Gait Distance (Feet): 190 Feet (x2 bouts of ~140 ft > ~190 ft) Assistive device: None, Rollator (4 wheels) Gait Pattern/deviations: Step-through pattern, Decreased stride length Gait velocity: decreased Gait velocity interpretation: <1.8 ft/sec, indicate of risk for recurrent falls   General Gait Details: Pt ambulates without LOB utilizing a rollator. She needs cues intermittently to keep the rollator proximal. Cues also provided for activity pacing. Pt also able to take a few steps without UE support or LOB, but prefers the rollator for energy conservation. CGA for safety   Stairs Stairs: Yes Stairs assistance: Contact guard assist Stair Management: Two rails, Alternating pattern, Step to pattern, Forwards Number of Stairs: 6 General stair comments: Ascends with bil rails and reciprocal step pattern. Descends with bil rails and step-to pattern. No LOB, CGA for safety   Wheelchair Mobility     Tilt Bed    Modified Rankin (Stroke Patients Only)       Balance Overall balance assessment: Needs assistance Sitting-balance support: Feet supported Sitting balance-Leahy Scale: Good     Standing balance support: Single extremity supported, No upper extremity supported, Bilateral upper extremity supported, During functional activity Standing balance-Leahy Scale: Fair Standing balance comment: Able to take a few steps without UE support but benefits from UE support for dynamic challenges and energy conservation  Cognition Arousal: Alert Behavior During Therapy: WFL for tasks assessed/performed Overall Cognitive Status: Within Functional Limits for tasks assessed                                 General Comments: History of  intellectual disability. Patient is grossly oriented and able to follow single step commands without difficulty.  The patient is very particular about her personal items in the room. Occasional cues to stay focused on tasks        Exercises      General Comments General comments (skin integrity, edema, etc.): educated pt on reducing sodium and processed foods intake, she verbalized understanding; SpO2 >/= 90% oN RA at rest, dropped to 80s% when ambulating on RA, >/= 87% (normally >/= 90%) on 2L when ambulating; cues provided for breathing in through nose and out through pursed lips      Pertinent Vitals/Pain Pain Assessment Pain Assessment: Faces Faces Pain Scale: No hurt Pain Intervention(s): Monitored during session    Home Living                          Prior Function            PT Goals (current goals can now be found in the care plan section) Acute Rehab PT Goals Patient Stated Goal: to return home PT Goal Formulation: With patient Time For Goal Achievement: 05/08/23 Potential to Achieve Goals: Good Progress towards PT goals: Progressing toward goals    Frequency    Min 1X/week      PT Plan      Co-evaluation              AM-PAC PT "6 Clicks" Mobility   Outcome Measure  Help needed turning from your back to your side while in a flat bed without using bedrails?: None Help needed moving from lying on your back to sitting on the side of a flat bed without using bedrails?: A Little Help needed moving to and from a bed to a chair (including a wheelchair)?: A Little Help needed standing up from a chair using your arms (e.g., wheelchair or bedside chair)?: A Little Help needed to walk in hospital room?: A Little Help needed climbing 3-5 steps with a railing? : A Little 6 Click Score: 19    End of Session Equipment Utilized During Treatment: Gait belt;Oxygen Activity Tolerance: Patient tolerated treatment well Patient left: in chair;with call  bell/phone within reach   PT Visit Diagnosis: Muscle weakness (generalized) (M62.81);Unsteadiness on feet (R26.81);Other abnormalities of gait and mobility (R26.89)     Time: 8657-8469 PT Time Calculation (min) (ACUTE ONLY): 28 min  Charges:    $Gait Training: 8-22 mins $Therapeutic Activity: 8-22 mins PT General Charges $$ ACUTE PT VISIT: 1 Visit                     Virgil Benedict, PT, DPT Acute Rehabilitation Services  Office: [phone]    Bettina Gavia 05/03/2023, 11:16 AM

## 2023-05-03 NOTE — Plan of Care (Signed)

## 2023-05-03 NOTE — TOC Transition Note (Addendum)
Transition of Care [facility]) - CM/SW Discharge Note   Patient Details  Name: Joanna [name] MRN: [id number] Date of Birth: February 23, 1971  [facility] CM/SW Contact:  Leone Haven, RN Phone Number: 05/03/2023, 12:23 PM   Clinical Narrative:    For dc today, she will need home oxygen,  NCM made referral to [facility] with [employer].  Per [employer] she received a rolling walker in April and will not be able to get another walker unless she pays out of pocket 175.00, patient told them she did not want to pay out of pocket.  Now she needs home oxygen.  This will be delivered to the room and the concentrator will be set up at the home.  She has transportation home.  Doctor would like to speak to sister or legal guardian prior to dc. NCM notified [facility] of dc today.   HHRN,HHOT added to services.  Per Mitch with [employer] patient already has home oxygen with them, he will bring a dc tank for her to go home with. Per [name] she states [employer] never came out to set up the home oxygen.  NCM contacted Marthann Schiller , he states he will take care of getting this set up for patient.    Final next level of care: Home w Home Health Services Barriers to Discharge: Equipment Delay   Patient Goals and CMS Choice CMS Medicare.gov Compare Post Acute Care list provided to:: Patient Represenative (must comment) Choice offered to / list presented to : Sibling  Discharge Placement                         Discharge Plan and Services Additional resources added to the After Visit Summary for   In-house Referral: NA Discharge Planning Services: CM Consult Post Acute Care Choice: Home Health          DME Arranged: Oxygen DME Agency: [employer] Date DME Agency Contacted: 05/03/23 Time DME Agency Contacted: 1223 Representative spoke with at DME Agency: Mitch HH Arranged: PT, OT, RN HH Agency: [facility] Date [facility] Agency Contacted: 05/02/23 Time HH Agency Contacted: 1113 Representative spoke with at [facility]  Agency: Kandee Keen  Social Determinants of Health (SDOH) Interventions SDOH Screenings   Food Insecurity: No Food Insecurity (04/27/2023)  Housing: Low Risk  (04/27/2023)  Transportation Needs: No Transportation Needs (04/27/2023)  Utilities: Not At Risk (04/27/2023)  Financial Resource Strain: Medium Risk (11/30/2022)   Received from [facility]  Physical Activity: Insufficiently Active (11/30/2022)   Received from [facility]  Social Connections: Socially Integrated (11/30/2022)   Received from [facility]  Stress: No Stress Concern Present (11/30/2022)   Received from [facility]  Tobacco Use: Low Risk  (04/26/2023)     Readmission Risk Interventions    05/01/2023    3:32 PM  Readmission Risk Prevention Plan  Transportation Screening Complete  PCP or Specialist Appt within 3-5 Days Complete  HRI or Home Care Consult Complete  Palliative Care Screening Not Applicable  Medication Review (RN Care Manager) Complete

## 2023-05-03 NOTE — Discharge Summary (Addendum)
Physician Discharge Summary   Patient: Joanna [name] MRN: [id number] DOB: 1970-11-26  Admit date:     04/26/2023  Discharge date: 05/03/23  Discharge Physician: York Ram Harshini Trent   PCP: Hillery Aldo, NP   Recommendations at discharge:    Patient has been placed on high doses of torsemide 100 mg po bid, with recommendations to follow up with Nephrology as outpatient.  Added supplemental 02 per [place] for ambulatory oxygen desaturation, likely related to pulmonary hypertension.  Follow up with Dr Jacinto Reap in 7 to 10 days.   I spoke with patient's POA ([name]), over the phone, we talked in detail about patient's condition, plan of care and prognosis and all questions were addressed.   Discharge Diagnoses: Principal Problem:   Acute on chronic diastolic CHF (congestive heart failure) (HCC) Active Problems:   CKD (chronic kidney disease) stage 4, GFR 15-29 ml/min (HCC)   Intellectual disability   HTN (hypertension)   Type 2 diabetes mellitus without complication (HCC)   Obesity, class 3  Resolved Problems:   * No resolved hospital problems. [facility] Course: Mrs. Joanna [name] was admitted to the hospital with the working diagnosis of acute on chronic diastolic heart failure decompensation in the setting of advance CKD.   52 y.o. female with past medical history of T2DM, hypertension, obesity, CKD and cognitive impairment who presented with worsening LE edema and dyspnea.  Recent hospitalization 01/2023 for hypertensive urgency as well as acute right MCA ischemic stroke.  Reported worsening dyspnea and lower extremity edema for the last 7 to 10 days prior to admission. EMS was called and she was found with 02 saturation 88% on room air, she was placed on supplemental 02 and was transported to the ED.  On her initial physical examination her blood pressure was 153/102, HR 77, RR 22 and 02 saturation 99% on supplemental 02.  Lungs with bilateral rales, heart with S1 and S2 present and rhythmic,  abdomen with no distention and positive lower extremity edema.   Na 137, K 5.2 CL 109, bicarbonate 20, glucose 83, bun 46 and cr 3,71  BNP 1,342  High sensitive troponin 14 and 14  Wbc 4,2 hgb 7,3 plt 242   Chest radiograph with left rotation, cardiomegaly and bilateral hilar vascular congestion.   EKG 80 bpm, normal axis, normal intervals, sinus rhythm with 1st degree AV block, with no significant ST segment or T wave changes.   Patient has placed on IV furosemide and then transitioned to oral torsemide.   11/15 volume status has improved and renal function is more stable.  Will need close follow up as outpatient.   Assessment and Plan: * Acute on chronic diastolic CHF (congestive heart failure) (HCC) Echocardiogram with preserved LV systolic function with EF 50 to 55%, moderate LVH, RV with moderate reduction in systolic function, RV with moderate enlargement, RVSP 48,2 mmHg, LA with severe dilatation, RA with moderate dilatation.  Acute on chronic core pulmonale Pulmonary hypertension.  Patient was placed on IV furosemide for diuresis, negative fluid balance was achieved, -9,933 ml, with significant improvement in her symptoms.   Plan to continue diuresis with oral torsemide 100 mg po bid. Blood pressure control with amlodipine, carvedilol, isosorbide.   Acute hypoxemic respiratory failure due to acute cardiogenic pulmonary edema and pulmonary hypertension.  02 saturation on room air at rest is 100%, and on ambulation down to 86%, improved to 96% on 2 L/min per [place].  Will arrange for home supplemental -02 per [place].   CKD (chronic  kidney disease) stage 4, GFR 15-29 ml/min (HCC) AKI, Hyperkalemia  Patient had aggressive diuresis with loop diuretics.  At the time of her discharge her volume status has improved.  Her serum cr has stabilized at 4,4 to 4,5, his K today is 4,3 and serum bicarbonate at 22.  Na 136 and BUN 66.   Patient will need close follow up as outpatient.   Continue loop diuretic therapy with torsemide 100 mg po bid.   Anemia of chronic renal disease. Anemia of iron deficiency.  Follow up Hgb is 7,5  SP IV iron, (iron sucrose 200 mg x 5 doses).  Continue with EPO per nephrology schedule.    Intellectual disability - noted; at baseline - Joanna [name] the nephew listed in epic appears to be the [facility] and sounds like the POA; [name] listed as sister is not actually her sister but someone patient calls her sister  HTN (hypertension) Continue blood pressure control with carvedilol, isosorbide and amlodipine.   Type 2 diabetes mellitus without complication (HCC) Glucose has been controlled, currently off insulin therapy.  Obesity, class 3 Calculated BMI is 48,3         Consultants: nephrology  Procedures performed: none   Disposition: Home Diet recommendation:  Cardiac and Carb modified diet DISCHARGE MEDICATION: Allergies as of 05/03/2023   No Known Allergies      Medication List     STOP taking these medications    cloNIDine 0.1 MG tablet Commonly known as: CATAPRES   furosemide 80 MG tablet Commonly known as: LASIX   sodium bicarbonate 650 MG tablet       TAKE these medications    amLODipine 10 MG tablet Commonly known as: NORVASC Take 1 tablet (10 mg total) by mouth daily.   aspirin EC 81 MG tablet Take 1 tablet (81 mg total) by mouth daily. Swallow whole.   atorvastatin 40 MG tablet Commonly known as: LIPITOR Take 1 tablet (40 mg total) by mouth daily.   carvedilol 12.5 MG tablet Commonly known as: COREG Take 1 tablet (12.5 mg total) by mouth 2 (two) times daily with a meal.   hydrALAZINE 100 MG tablet Commonly known as: APRESOLINE Take 1 tablet (100 mg total) by mouth every 8 (eight) hours.   isosorbide mononitrate 120 MG 24 hr tablet Commonly known as: IMDUR Take 1 tablet (120 mg total) by mouth daily.   torsemide 100 MG tablet Commonly known as: DEMADEX Take 1 tablet (100 mg total) by mouth 2  (two) times daily.               Durable Medical Equipment  (From admission, onward)           Start     Ordered   05/03/23 1201  For home use only DME oxygen  Once       Question Answer Comment  Length of Need 12 Months   Mode or (Route) Nasal cannula   Liters per Minute 2   Frequency Continuous (stationary and portable oxygen unit needed)   Oxygen conserving device Yes   Oxygen delivery system Gas      05/03/23 1200   05/01/23 1536  For home use only DME Walker rolling  Once       Question Answer Comment  Walker: With 5 Inch Wheels   Patient needs a walker to treat with the following condition Weakness      05/01/23 1535            Follow-up Information  [facility] Follow up.   Why: GO: NOV. 20 AT 10:20AM. TRANSPORTATION WILL PICK YOU UP AT 10 AM Contact information: [address]  [place], [place] [address] [phone]        [facility] Follow up.   Specialty: Home Health Services Why: Agency will call you to set up apt times Contact information: [address] [place] [address] [phone]                Discharge Exam: Filed Weights   05/01/23 0434 05/02/23 0305 05/03/23 0504  Weight: 120 kg 117.5 kg 117 kg   BP (!) 141/70 (BP Location: Right Arm)   Pulse 77   Temp 97.8 F (36.6 C) (Oral)   Resp 18   Ht 5\' 2"  (1.575 m)   Wt 117 kg   SpO2 97%   BMI 47.18 kg/m   Patient is feeling better, no chest pain or dyspnea, edema continue to improve.  Neurology awake and alert ENT with mild pallor Cardiovascular with S1 and S2 present and regular with no gallops, rubs or murmurs No JVD Positive lower extremity edema + with non pitting component as well.  Respiratory with no rales or wheezing, no rhonchi Abdomen with no distention   Condition at discharge: stable  The results of significant diagnostics from this hospitalization (including imaging, microbiology, ancillary and  laboratory) are listed below for reference.   Imaging Studies: DG Chest Port 1 View  Result Date: 04/26/2023 CLINICAL DATA:  Shortness of breath. EXAM: PORTABLE CHEST 1 VIEW COMPARISON:  12/16/2019 FINDINGS: Chronic cardiomegaly. Vascular congestion. Interstitial thickening may represent edema. No large pleural effusion, left lung base assessment is limited due to soft tissue attenuation. No pneumothorax. IMPRESSION: Chronic cardiomegaly with vascular congestion and possible interstitial edema. Electronically Signed   By: Narda Rutherford M.D.   On: 04/26/2023 20:50    Microbiology: Results for orders placed or performed during the hospital encounter of 01/28/23  MRSA Next Gen by PCR, Nasal     Status: Abnormal   Collection Time: 01/28/23 10:50 PM   Specimen: Nasal Mucosa; Nasal Swab  Result Value Ref Range Status   MRSA by PCR Next Gen DETECTED (A) NOT DETECTED Final    Comment: (NOTE) The GeneXpert MRSA Assay (FDA approved for NASAL specimens only), is one component of a comprehensive MRSA colonization surveillance program. It is not intended to diagnose MRSA infection nor to guide or monitor treatment for MRSA infections. Test performance is not FDA approved in patients less than 33 years old. Performed at [facility], [address] [place], [place] [address]     Labs: CBC: Recent Labs  Lab 04/28/23 0308 04/29/23 0303 04/29/23 1732 04/30/23 0402 05/01/23 0315 05/02/23 0339  WBC 4.4 5.3  --  5.3 5.2 5.2  NEUTROABS 3.3 4.1  --  3.9 3.9 3.9  HGB 7.1* 7.0* 7.9* 7.5* 8.0* 7.5*  HCT 23.6* 23.3* 25.9* 25.0* 26.3* 24.6*  MCV 91.5 91.4  --  89.9 89.5 90.1  PLT 217 206  --  225 246 232   Basic Metabolic Panel: Recent Labs  Lab 04/27/23 0118 04/28/23 0308 04/29/23 0303 04/30/23 0402 05/01/23 0315 05/02/23 0339 05/03/23 0326  NA 139 139 138 137 136 133* 136  K 4.7 5.7* 5.1 4.8 4.7 4.2 4.3  CL 109 110 110 107 105 105 104  CO2 21* 22 23 22  21* 22 22   GLUCOSE 102* 112* 114* 103* 86 135* 142*  BUN 45*  49* 52* 56* 58* 59* 66*  CREATININE 3.79* 4.22* 4.26* 4.27* 4.32* 4.46* 4.54*  CALCIUM 8.2* 8.0* 7.8* 8.0* 8.1* 7.9* 8.2*  MG  --  2.0 2.0 1.8 1.8 2.0  --   PHOS 4.1  --   --  4.0  --   --  4.3   Liver Function Tests: Recent Labs  Lab 04/27/23 0118 04/30/23 0402 05/03/23 0326  AST 23  --   --   ALT 26  --   --   ALKPHOS 59  --   --   BILITOT 0.5  --   --   PROT 5.4*  --   --   ALBUMIN 2.6* 2.3* 2.4*   CBG: No results for input(s): "GLUCAP" in the last 168 hours.  Discharge time spent: greater than 30 minutes.  Signed: Coralie Keens, MD [facility] 05/03/2023

## 2023-05-03 NOTE — Progress Notes (Signed)
Patient's nephew has received discharge instructions to include medications and follow up appointments.

## 2023-05-03 NOTE — Progress Notes (Signed)
Patient's sister [name] confirmed the patient's nephew will be picking her up for discharge today. He will be called once oxygen has been delivered.

## 2023-05-17 ENCOUNTER — Emergency Department (HOSPITAL_BASED_OUTPATIENT_CLINIC_OR_DEPARTMENT_OTHER): Payer: Medicare HMO

## 2023-05-17 ENCOUNTER — Encounter (HOSPITAL_BASED_OUTPATIENT_CLINIC_OR_DEPARTMENT_OTHER): Payer: Self-pay

## 2023-05-17 ENCOUNTER — Emergency Department (HOSPITAL_BASED_OUTPATIENT_CLINIC_OR_DEPARTMENT_OTHER)
Admission: EM | Admit: 2023-05-17 | Discharge: 2023-05-17 | Disposition: A | Discharge status: Home / Self Care | Payer: Medicare HMO | Attending: Emergency Medicine | Admitting: Emergency Medicine

## 2023-05-17 ENCOUNTER — Other Ambulatory Visit: Payer: Self-pay

## 2023-05-17 DIAGNOSIS — Z20822 Contact with and (suspected) exposure to covid-19: Secondary | ICD-10-CM | POA: Insufficient documentation

## 2023-05-17 DIAGNOSIS — Z79899 Other long term (current) drug therapy: Secondary | ICD-10-CM | POA: Diagnosis not present

## 2023-05-17 DIAGNOSIS — J189 Pneumonia, unspecified organism: Secondary | ICD-10-CM | POA: Insufficient documentation

## 2023-05-17 DIAGNOSIS — I110 Hypertensive heart disease with heart failure: Secondary | ICD-10-CM | POA: Diagnosis not present

## 2023-05-17 DIAGNOSIS — Z7982 Long term (current) use of aspirin: Secondary | ICD-10-CM | POA: Insufficient documentation

## 2023-05-17 DIAGNOSIS — I5031 Acute diastolic (congestive) heart failure: Secondary | ICD-10-CM | POA: Diagnosis not present

## 2023-05-17 DIAGNOSIS — I2510 Atherosclerotic heart disease of native coronary artery without angina pectoris: Secondary | ICD-10-CM | POA: Diagnosis not present

## 2023-05-17 DIAGNOSIS — E119 Type 2 diabetes mellitus without complications: Secondary | ICD-10-CM | POA: Insufficient documentation

## 2023-05-17 DIAGNOSIS — I509 Heart failure, unspecified: Secondary | ICD-10-CM

## 2023-05-17 DIAGNOSIS — R042 Hemoptysis: Secondary | ICD-10-CM | POA: Diagnosis present

## 2023-05-17 LAB — BASIC METABOLIC PANEL
Anion gap: 12 (ref 5–15)
BUN: 93 mg/dL — ABNORMAL HIGH (ref 6–20)
CO2: 25 mmol/L (ref 22–32)
Calcium: 8.8 mg/dL — ABNORMAL LOW (ref 8.9–10.3)
Chloride: 104 mmol/L (ref 98–111)
Creatinine, Ser: 5.1 mg/dL — ABNORMAL HIGH (ref 0.44–1.00)
GFR, Estimated: 10 mL/min — ABNORMAL LOW (ref >60)
Glucose, Bld: 118 mg/dL — ABNORMAL HIGH (ref 70–99)
Potassium: 4.8 mmol/L (ref 3.5–5.1)
Sodium: 141 mmol/L (ref 135–145)

## 2023-05-17 LAB — CBC
HCT: 25.6 % — ABNORMAL LOW (ref 36.0–46.0)
Hemoglobin: 8.1 g/dL — ABNORMAL LOW (ref 12.0–15.0)
MCH: 28.5 pg (ref 26.0–34.0)
MCHC: 31.6 g/dL (ref 30.0–36.0)
MCV: 90.1 fL (ref 80.0–100.0)
Platelets: 206 10*3/uL (ref 150–400)
RBC: 2.84 MIL/uL — ABNORMAL LOW (ref 3.87–5.11)
RDW: 17.3 % — ABNORMAL HIGH (ref 11.5–15.5)
WBC: 4 10*3/uL (ref 4.0–10.5)
nRBC: 0 % (ref 0.0–0.2)

## 2023-05-17 LAB — RESP PANEL BY RT-PCR (RSV, FLU A&B, COVID)  RVPGX2
Influenza A by PCR: NEGATIVE
Influenza B by PCR: NEGATIVE
Resp Syncytial Virus by PCR: NEGATIVE
SARS Coronavirus 2 by RT PCR: NEGATIVE

## 2023-05-17 LAB — BRAIN NATRIURETIC PEPTIDE: B Natriuretic Peptide: 1742.7 pg/mL — ABNORMAL HIGH (ref 0.0–100.0)

## 2023-05-17 MED ORDER — FUROSEMIDE 10 MG/ML IJ SOLN
40.0000 mg | Freq: Once | INTRAMUSCULAR | Status: AC
  Administered 2023-05-17: 40 mg via INTRAVENOUS
  Filled 2023-05-17: qty 4

## 2023-05-17 MED ORDER — DOXYCYCLINE HYCLATE 100 MG PO TABS
100.0000 mg | ORAL_TABLET | Freq: Once | ORAL | Status: AC
  Administered 2023-05-17: 100 mg via ORAL
  Filled 2023-05-17: qty 1

## 2023-05-17 MED ORDER — DOXYCYCLINE HYCLATE 100 MG PO CAPS: 100.0000 mg | ORAL_CAPSULE | Freq: Two times a day (BID) | ORAL | 0 refills | Status: AC

## 2023-05-17 NOTE — ED Triage Notes (Signed)
She c/o "My home-health nurse wants me to be checked because now when I cough I cough up a little blood sometimes". She reports being on home O2 at 1 l.p.m.; however, she arrives here with a small O2 tank set at 3 l.p.m. she is alert and oriented x 4 with clear speech and is in no  distress. She denies fever.

## 2023-05-17 NOTE — ED Notes (Signed)
Discharge paperwork given and verbally understood. 

## 2023-05-17 NOTE — ED Provider Notes (Signed)
[place] EMERGENCY DEPARTMENT AT [facility] Provider Note   CSN: [id number] Arrival date & time: 05/17/23  1557     History  Chief Complaint  Patient presents with   Hemoptysis    Joanna [name] is a 52 y.o. female.  HPI Patient presents with some hemoptysis.  Reportedly sent in by home health nurse.  A few times this last week has had some coughing up blood.  Is on chronic oxygen now since earlier this month admission in the hospital for CHF.  States she has coughed and had some blood come out.  States has been somewhat red.  Breathing is what it has been recently.  No fevers or chills.  No other bleeding.  Patient states she does not feel as if she has much fluid on her but does have edema on both her legs.   Past Medical History:  Diagnosis Date   Acute diastolic heart failure (HCC) 12/16/2019   Acute respiratory failure with hypoxia (HCC) 12/16/2019   AKI (acute kidney injury) (HCC) 12/16/2019   Anasarca 12/16/2019   CAD (coronary artery disease)    Diabetes mellitus without complication (HCC)    HTN (hypertension) 12/16/2019   Hypertension    Hypertensive urgency 12/16/2019   Intellectual disability 12/16/2019   Morbid obesity (HCC)    New onset of congestive heart failure (HCC) 12/16/2019   Obesity, Class III, BMI 40-49.9 (morbid obesity) (HCC) 12/16/2019   Type 2 diabetes mellitus without complication (HCC) 12/16/2019    Home Medications Prior to Admission medications   Medication Sig Start Date End Date Taking? Authorizing Provider  doxycycline (VIBRAMYCIN) 100 MG capsule Take 1 capsule (100 mg total) by mouth 2 (two) times daily for 5 days. 05/17/23 05/22/23 Yes Benjiman Core, MD  amLODipine (NORVASC) 10 MG tablet Take 1 tablet (10 mg total) by mouth daily. 02/05/23 04/25/24  Dorcas Carrow, MD  aspirin EC 81 MG EC tablet Take 1 tablet (81 mg total) by mouth daily. Swallow whole. 12/23/19   Jerald Kief, MD  atorvastatin (LIPITOR) 40 MG tablet Take 1  tablet (40 mg total) by mouth daily. 02/05/23 04/25/24  Dorcas Carrow, MD  carvedilol (COREG) 12.5 MG tablet Take 1 tablet (12.5 mg total) by mouth 2 (two) times daily with a meal. 02/04/23 04/25/24  Dorcas Carrow, MD  hydrALAZINE (APRESOLINE) 100 MG tablet Take 1 tablet (100 mg total) by mouth every 8 (eight) hours. 02/04/23 04/25/24  Dorcas Carrow, MD  isosorbide mononitrate (IMDUR) 120 MG 24 hr tablet Take 1 tablet (120 mg total) by mouth daily. 12/23/19 04/25/24  Jerald Kief, MD  torsemide (DEMADEX) 100 MG tablet Take 1 tablet (100 mg total) by mouth 2 (two) times daily. 05/03/23 06/02/23  Arrien, York Ram, MD      Allergies    Patient has no known allergies.    Review of Systems   Review of Systems  Physical Exam Updated Vital Signs BP 128/65   Pulse 65   Temp 97.6 F (36.4 C) (Oral)   Resp 18   LMP  (LMP Unknown)   SpO2 98%  Physical Exam Vitals and nursing note reviewed.  HENT:     Head: Atraumatic.     Nose:     Comments: No blood in posterior pharynx, however does have some dried blood in bilateral naris. Cardiovascular:     Rate and Rhythm: Regular rhythm.  Pulmonary:     Breath sounds: No wheezing.  Musculoskeletal:     Cervical back: Neck supple.  Right lower leg: Edema present.     Left lower leg: Edema present.  Skin:    Capillary Refill: Capillary refill takes less than 2 seconds.  Neurological:     Mental Status: She is alert and oriented to person, place, and time.     ED Results / Procedures / Treatments   Labs (all labs ordered are listed, but only abnormal results are displayed) Labs Reviewed  BRAIN NATRIURETIC PEPTIDE - Abnormal; Notable for the following components:      Result Value   B Natriuretic Peptide 1,742.7 (*)    All other components within normal limits  CBC - Abnormal; Notable for the following components:   RBC 2.84 (*)    Hemoglobin 8.1 (*)    HCT 25.6 (*)    RDW 17.3 (*)    All other components within normal limits   BASIC METABOLIC PANEL - Abnormal; Notable for the following components:   Glucose, Bld 118 (*)    BUN 93 (*)    Creatinine, Ser 5.10 (*)    Calcium 8.8 (*)    GFR, Estimated 10 (*)    All other components within normal limits  RESP PANEL BY RT-PCR (RSV, FLU A&B, COVID)  [id number]    EKG None  Radiology DG Chest Portable 1 View  Result Date: 05/17/2023 CLINICAL DATA:  Hemoptysis EXAM: PORTABLE CHEST 1 VIEW COMPARISON:  Chest radiograph dated 04/26/2023 FINDINGS: Normal lung volumes. Bilateral interstitial thickening. Patchy left retrocardiac opacity. No pleural effusion or pneumothorax. Similar enlarged cardiomediastinal silhouette. No acute osseous abnormality. IMPRESSION: 1. Bilateral interstitial thickening, which may represent pulmonary edema. 2. Patchy left retrocardiac opacity, which may represent atelectasis, aspiration, or pneumonia. 3. Similar cardiomegaly. Electronically Signed   By: Agustin Cree M.D.   On: 05/17/2023 17:15    Procedures Procedures    Medications Ordered in ED Medications  doxycycline (VIBRA-TABS) tablet 100 mg (has no administration in time range)  furosemide (LASIX) injection 40 mg (40 mg Intravenous Given 05/17/23 1944)    ED Course/ Medical Decision Making/ A&P                                 Medical Decision Making Amount and/or Complexity of Data Reviewed Labs: ordered. Radiology: ordered.  Risk Prescription drug management.   Patient presents with episodes of hemoptysis.  Does have history of CHF and is on oxygen.  Differential diagnose include URI, CHF, pulmonary embolism, epistaxis.  Lungs are clear.  He does have some edema in both her legs but appears symmetric.  Does have dried blood in both nostrils.  Will get x-ray and some basic blood work.   I reviewed recent discharge note.  BNP is high.  Higher than was previously however patient states her breathing is actually improved from before.  However x-ray does show some volume  overload.  Will give dose of Lasix here though do not think we need admission to the hospital.  Also has potential left lower lobe pneumonia.  Has had some hemoptysis and cough with occasional sputum.  Will treat with just some doxycycline at this time.  Headache is worth covering for possible infection even though could be atelectasis.  Pulmonary embolism felt less likely.  Will discharge home with short-term follow-up.        Final Clinical Impression(s) / ED Diagnoses Final diagnoses:  Community acquired pneumonia, unspecified laterality  Hemoptysis  Congestive heart failure, unspecified HF chronicity, unspecified heart  failure type [facility])    Rx / DC Orders ED Discharge Orders          Ordered    doxycycline (VIBRAMYCIN) 100 MG capsule  2 times daily        05/17/23 2018              Benjiman Core, MD 05/17/23 2019

## 2023-05-17 NOTE — Discharge Instructions (Addendum)
Follow-up with your doctor.  You also may need to follow-up with pulmonology.  Watch for worsening difficulty breathing.

## 2023-05-31 ENCOUNTER — Other Ambulatory Visit (HOSPITAL_COMMUNITY): Payer: Self-pay

## 2023-06-03 ENCOUNTER — Encounter (HOSPITAL_COMMUNITY)
Admission: RE | Admit: 2023-06-03 | Discharge: 2023-06-03 | Disposition: A | Discharge status: Home / Self Care | Payer: Medicare HMO | Source: Ambulatory Visit | Attending: Internal Medicine | Admitting: Internal Medicine

## 2023-06-03 DIAGNOSIS — D631 Anemia in chronic kidney disease: Secondary | ICD-10-CM | POA: Insufficient documentation

## 2023-06-03 DIAGNOSIS — N189 Chronic kidney disease, unspecified: Secondary | ICD-10-CM | POA: Diagnosis present

## 2023-06-03 MED ORDER — SODIUM CHLORIDE 0.9 % IV SOLN
510.0000 mg | INTRAVENOUS | Status: DC
  Administered 2023-06-03: 510 mg via INTRAVENOUS
  Filled 2023-06-03: qty 510

## 2023-06-10 ENCOUNTER — Encounter (HOSPITAL_COMMUNITY)
Admission: RE | Admit: 2023-06-10 | Discharge: 2023-06-10 | Disposition: A | Discharge status: Home / Self Care | Payer: Medicare HMO | Source: Ambulatory Visit | Attending: Internal Medicine | Admitting: Internal Medicine

## 2023-06-10 DIAGNOSIS — N189 Chronic kidney disease, unspecified: Secondary | ICD-10-CM | POA: Diagnosis not present

## 2023-06-10 MED ORDER — SODIUM CHLORIDE 0.9 % IV SOLN
510.0000 mg | INTRAVENOUS | Status: DC
  Administered 2023-06-10: 510 mg via INTRAVENOUS
  Filled 2023-06-10: qty 510

## 2023-06-21 ENCOUNTER — Other Ambulatory Visit: Payer: Self-pay

## 2023-06-21 DIAGNOSIS — N184 Chronic kidney disease, stage 4 (severe): Secondary | ICD-10-CM

## 2023-06-27 NOTE — Progress Notes (Signed)
 Patient ID: Joanna [name], female   DOB: 02-12-1971, 53 y.o.   MRN: [id number]  Reason for Consult: No chief complaint on file.   Referred by Aubrii Sharpless Pita, MD  Subjective:     HPI  Joanna [name] is a 53 y.o. female who presents for evaluation HD access creation.  Past Medical History: 12/16/2019: Acute diastolic heart failure (HCC) 12/16/2019: Acute respiratory failure with hypoxia (HCC) 12/16/2019: AKI (acute kidney injury) (HCC) 12/16/2019: Anasarca No date: CAD (coronary artery disease) No date: Diabetes mellitus without complication (HCC) 12/16/2019: HTN (hypertension) No date: Hypertension 12/16/2019: Hypertensive urgency 12/16/2019: Intellectual disability No date: Morbid obesity (HCC) 12/16/2019: New onset of congestive heart failure (HCC) 12/16/2019: Obesity, Class III, BMI 40-49.9 (morbid obesity) (HCC) 12/16/2019: Type 2 diabetes mellitus without complication (HCC)  Family History  Problem Relation Age of Onset   Heart disease Mother        Enlarged heart per patient   Hypertension Mother    Diabetes Mother    Hypertension Sister    Diabetes Sister    No past surgical history on file.  Short Social History:  Social History   Tobacco Use   Smoking status: Never   Smokeless tobacco: Never  Substance Use Topics   Alcohol use: Not Currently    Comment: socially    No Known Allergies  Current Outpatient Medications  Medication Sig Dispense Refill   amLODipine (NORVASC) 10 MG tablet Take 1 tablet (10 mg total) by mouth daily. 30 tablet 0   aspirin EC 81 MG EC tablet Take 1 tablet (81 mg total) by mouth daily. Swallow whole. 30 tablet 11   atorvastatin (LIPITOR) 40 MG tablet Take 1 tablet (40 mg total) by mouth daily. 30 tablet 0   carvedilol (COREG) 12.5 MG tablet Take 1 tablet (12.5 mg total) by mouth 2 (two) times daily with a meal. 60 tablet 0   hydrALAZINE (APRESOLINE) 100 MG tablet Take 1 tablet (100 mg total) by mouth every 8 (eight) hours. 30 tablet  0   isosorbide mononitrate (IMDUR) 120 MG 24 hr tablet Take 1 tablet (120 mg total) by mouth daily. 30 tablet 0   torsemide (DEMADEX) 100 MG tablet Take 1 tablet (100 mg total) by mouth 2 (two) times daily. 60 tablet 0   No current facility-administered medications for this visit.    REVIEW OF SYSTEMS   All other systems were reviewed and are negative     Objective:  Objective   There were no vitals filed for this visit. There is no height or weight on file to calculate BMI.  Physical Exam General: no acute distress Cardiac: hemodynamically stable Pulm: normal work of breathing Neuro: alert, no focal deficit Extremities: No edema, cyanosis or wounds Vascular:   Right: palpable brachial, radial  Left: palpable brachial, radial   Data: UE arterial duplex Right Pre-Dialysis Findings:  +-----------------------+----------+--------------------+---------+--------  +  Location              PSV (cm/s)Intralum. Diam. (cm)Waveform  Comments  +-----------------------+----------+--------------------+---------+--------  +  Brachial Antecub. [id number]        0.44                triphasic           +-----------------------+----------+--------------------+---------+--------  +  Radial Art at Wrist    90        0.22                triphasic           +-----------------------+----------+--------------------+---------+--------  +  Ulnar Art at Wrist     59        0.22                triphasic           +-----------------------+----------+--------------------+---------+--------  +    Left Pre-Dialysis Findings:  +-----------------------+----------+--------------------+---------+--------  +  Location              PSV (cm/s)Intralum. Diam. (cm)Waveform  Comments  +-----------------------+----------+--------------------+---------+--------  +  Brachial Antecub. [id number]        0.42                triphasic            +-----------------------+----------+--------------------+---------+--------  +  Radial Art at Wrist    86        0.25                triphasic           +-----------------------+----------+--------------------+---------+--------  +  Ulnar Art at Wrist     61        0.22                triphasic           +-----------------------+----------+--------------------+---------+--------   Vein mapping +-----------------+-------------+----------+--------+  Right Cephalic   Diameter (cm)Depth (cm)Findings  +-----------------+-------------+----------+--------+  Shoulder            0.28                         +-----------------+-------------+----------+--------+  Prox upper arm       0.29                         +-----------------+-------------+----------+--------+  Mid upper arm     0.23 / 0.30                     +-----------------+-------------+----------+--------+  Dist upper arm       0.19                         +-----------------+-------------+----------+--------+  Antecubital fossa 0.22 / 0.46                     +-----------------+-------------+----------+--------+  Prox forearm         0.16                         +-----------------+-------------+----------+--------+  Mid forearm          0.18                         +-----------------+-------------+----------+--------+  Dist forearm         0.17                         +-----------------+-------------+----------+--------+   +-----------------+-------------+----------+-------------------+  Right Basilic    Diameter (cm)Depth (cm)     Findings        +-----------------+-------------+----------+-------------------+  Prox upper arm       0.59                                    +-----------------+-------------+----------+-------------------+  Mid upper arm        0.50  joins and branching   +-----------------+-------------+----------+-------------------+  Dist upper arm       0.38                                    +-----------------+-------------+----------+-------------------+  Antecubital fossa 0.34 / 0.30                branching       +-----------------+-------------+----------+-------------------+  Prox forearm         0.18                    branching       +-----------------+-------------+----------+-------------------+   +-----------------+-------------+----------+---------+  Left Cephalic    Diameter (cm)Depth (cm)Findings   +-----------------+-------------+----------+---------+  Shoulder            0.27                          +-----------------+-------------+----------+---------+  Prox upper arm       0.23               branching  +-----------------+-------------+----------+---------+  Mid upper arm     0.24/ 0.14                       +-----------------+-------------+----------+---------+  Dist upper arm       0.24               branching  +-----------------+-------------+----------+---------+  Antecubital fossa    0.42                          +-----------------+-------------+----------+---------+  Prox forearm         0.19               branching  +-----------------+-------------+----------+---------+  Mid forearm          0.19               branching  +-----------------+-------------+----------+---------+  Dist forearm         0.16                          +-----------------+-------------+----------+---------+   +-----------------+-------------+----------+------------------------+  Left Basilic     Diameter (cm)Depth (cm)        Findings          +-----------------+-------------+----------+------------------------+  Prox upper arm       0.54               branching and joins deep  +-----------------+-------------+----------+------------------------+  Mid upper arm        0.28                                          +-----------------+-------------+----------+------------------------+  Dist upper arm    0.31 / 0.28                  branching          +-----------------+-------------+----------+------------------------+  Antecubital fossa    0.28                                         +-----------------+-------------+----------+------------------------+  Prox forearm  0.13 / 0.16                     NWV             +-----------------+-------------+----------+------------------------+   Note from nephrologist reviewed       Assessment/Plan:     Malea Swilling is a 53 y.o. female with CKD stage V who presents to discuss permanent access creation.  Dominant hand: right Previous access surgeries: none Previous catheters: none Other arm surgeries/injuries: none  The vein mapping suggests there is a suitable L cephalic and basilic and we discussed that they are a candidate for L arm AVF  The risks an benefits including of access creation were reviewed including: need for additional procedures, need for additional creations, steal, ischemia monomelic neuropathy, failure of access, and bleeding. The patient expressed understand and is willing to proceed.  I explained that I will perform intraoperative vein mapping to confirm the above findings and will determine the most appropriate access to create but with a preoperative plan for left arm BC AVF.     Daria Pastures MD [facility]

## 2023-06-27 NOTE — H&P (View-Only) (Signed)
Patient ID: Joanna [name], female   DOB: 02-12-1971, 53 y.o.   MRN: [id number]  Reason for Consult: No chief complaint on file.   Referred by Aubrii Sharpless Pita, MD  Subjective:     HPI  Joanna [name] is a 53 y.o. female who presents for evaluation HD access creation.  Past Medical History: 12/16/2019: Acute diastolic heart failure (HCC) 12/16/2019: Acute respiratory failure with hypoxia (HCC) 12/16/2019: AKI (acute kidney injury) (HCC) 12/16/2019: Anasarca No date: CAD (coronary artery disease) No date: Diabetes mellitus without complication (HCC) 12/16/2019: HTN (hypertension) No date: Hypertension 12/16/2019: Hypertensive urgency 12/16/2019: Intellectual disability No date: Morbid obesity (HCC) 12/16/2019: New onset of congestive heart failure (HCC) 12/16/2019: Obesity, Class III, BMI 40-49.9 (morbid obesity) (HCC) 12/16/2019: Type 2 diabetes mellitus without complication (HCC)  Family History  Problem Relation Age of Onset   Heart disease Mother        Enlarged heart per patient   Hypertension Mother    Diabetes Mother    Hypertension Sister    Diabetes Sister    No past surgical history on file.  Short Social History:  Social History   Tobacco Use   Smoking status: Never   Smokeless tobacco: Never  Substance Use Topics   Alcohol use: Not Currently    Comment: socially    No Known Allergies  Current Outpatient Medications  Medication Sig Dispense Refill   amLODipine (NORVASC) 10 MG tablet Take 1 tablet (10 mg total) by mouth daily. 30 tablet 0   aspirin EC 81 MG EC tablet Take 1 tablet (81 mg total) by mouth daily. Swallow whole. 30 tablet 11   atorvastatin (LIPITOR) 40 MG tablet Take 1 tablet (40 mg total) by mouth daily. 30 tablet 0   carvedilol (COREG) 12.5 MG tablet Take 1 tablet (12.5 mg total) by mouth 2 (two) times daily with a meal. 60 tablet 0   hydrALAZINE (APRESOLINE) 100 MG tablet Take 1 tablet (100 mg total) by mouth every 8 (eight) hours. 30 tablet  0   isosorbide mononitrate (IMDUR) 120 MG 24 hr tablet Take 1 tablet (120 mg total) by mouth daily. 30 tablet 0   torsemide (DEMADEX) 100 MG tablet Take 1 tablet (100 mg total) by mouth 2 (two) times daily. 60 tablet 0   No current facility-administered medications for this visit.    REVIEW OF SYSTEMS   All other systems were reviewed and are negative     Objective:  Objective   There were no vitals filed for this visit. There is no height or weight on file to calculate BMI.  Physical Exam General: no acute distress Cardiac: hemodynamically stable Pulm: normal work of breathing Neuro: alert, no focal deficit Extremities: No edema, cyanosis or wounds Vascular:   Right: palpable brachial, radial  Left: palpable brachial, radial   Data: UE arterial duplex Right Pre-Dialysis Findings:  +-----------------------+----------+--------------------+---------+--------  +  Location              PSV (cm/s)Intralum. Diam. (cm)Waveform  Comments  +-----------------------+----------+--------------------+---------+--------  +  Brachial Antecub. fossa82        0.44                triphasic           +-----------------------+----------+--------------------+---------+--------  +  Radial Art at Wrist    90        0.22                triphasic           +-----------------------+----------+--------------------+---------+--------  +  Ulnar Art at Wrist     59        0.22                triphasic           +-----------------------+----------+--------------------+---------+--------  +    Left Pre-Dialysis Findings:  +-----------------------+----------+--------------------+---------+--------  +  Location              PSV (cm/s)Intralum. Diam. (cm)Waveform  Comments  +-----------------------+----------+--------------------+---------+--------  +  Brachial Antecub. fossa67        0.42                triphasic            +-----------------------+----------+--------------------+---------+--------  +  Radial Art at Wrist    86        0.25                triphasic           +-----------------------+----------+--------------------+---------+--------  +  Ulnar Art at Wrist     61        0.22                triphasic           +-----------------------+----------+--------------------+---------+--------   Vein mapping +-----------------+-------------+----------+--------+  Right Cephalic   Diameter (cm)Depth (cm)Findings  +-----------------+-------------+----------+--------+  Shoulder            0.28                         +-----------------+-------------+----------+--------+  Prox upper arm       0.29                         +-----------------+-------------+----------+--------+  Mid upper arm     0.23 / 0.30                     +-----------------+-------------+----------+--------+  Dist upper arm       0.19                         +-----------------+-------------+----------+--------+  Antecubital fossa 0.22 / 0.46                     +-----------------+-------------+----------+--------+  Prox forearm         0.16                         +-----------------+-------------+----------+--------+  Mid forearm          0.18                         +-----------------+-------------+----------+--------+  Dist forearm         0.17                         +-----------------+-------------+----------+--------+   +-----------------+-------------+----------+-------------------+  Right Basilic    Diameter (cm)Depth (cm)     Findings        +-----------------+-------------+----------+-------------------+  Prox upper arm       0.59                                    +-----------------+-------------+----------+-------------------+  Mid upper arm        0.50  joins and branching   +-----------------+-------------+----------+-------------------+  Dist upper arm       0.38                                    +-----------------+-------------+----------+-------------------+  Antecubital fossa 0.34 / 0.30                branching       +-----------------+-------------+----------+-------------------+  Prox forearm         0.18                    branching       +-----------------+-------------+----------+-------------------+   +-----------------+-------------+----------+---------+  Left Cephalic    Diameter (cm)Depth (cm)Findings   +-----------------+-------------+----------+---------+  Shoulder            0.27                          +-----------------+-------------+----------+---------+  Prox upper arm       0.23               branching  +-----------------+-------------+----------+---------+  Mid upper arm     0.24/ 0.14                       +-----------------+-------------+----------+---------+  Dist upper arm       0.24               branching  +-----------------+-------------+----------+---------+  Antecubital fossa    0.42                          +-----------------+-------------+----------+---------+  Prox forearm         0.19               branching  +-----------------+-------------+----------+---------+  Mid forearm          0.19               branching  +-----------------+-------------+----------+---------+  Dist forearm         0.16                          +-----------------+-------------+----------+---------+   +-----------------+-------------+----------+------------------------+  Left Basilic     Diameter (cm)Depth (cm)        Findings          +-----------------+-------------+----------+------------------------+  Prox upper arm       0.54               branching and joins deep  +-----------------+-------------+----------+------------------------+  Mid upper arm        0.28                                          +-----------------+-------------+----------+------------------------+  Dist upper arm    0.31 / 0.28                  branching          +-----------------+-------------+----------+------------------------+  Antecubital fossa    0.28                                         +-----------------+-------------+----------+------------------------+  Prox forearm  0.13 / 0.16                     NWV             +-----------------+-------------+----------+------------------------+   Note from nephrologist reviewed       Assessment/Plan:     Joanna [name] is a 53 y.o. female with CKD stage V who presents to discuss permanent access creation.  Dominant hand: right Previous access surgeries: none Previous catheters: none Other arm surgeries/injuries: none  The vein mapping suggests there is a suitable L cephalic and basilic and we discussed that they are a candidate for L arm AVF  The risks an benefits including of access creation were reviewed including: need for additional procedures, need for additional creations, steal, ischemia monomelic neuropathy, failure of access, and bleeding. The patient expressed understand and is willing to proceed.  I explained that I will perform intraoperative vein mapping to confirm the above findings and will determine the most appropriate access to create but with a preoperative plan for left arm BC AVF.     Daria Pastures MD [facility]

## 2023-06-28 ENCOUNTER — Ambulatory Visit (INDEPENDENT_AMBULATORY_CARE_PROVIDER_SITE_OTHER)
Admission: RE | Admit: 2023-06-28 | Discharge: 2023-06-28 | Disposition: A | Discharge status: Home / Self Care | Payer: 59 | Source: Ambulatory Visit | Attending: Vascular Surgery

## 2023-06-28 ENCOUNTER — Ambulatory Visit (INDEPENDENT_AMBULATORY_CARE_PROVIDER_SITE_OTHER): Payer: 59 | Admitting: Vascular Surgery

## 2023-06-28 ENCOUNTER — Telehealth: Payer: Self-pay

## 2023-06-28 ENCOUNTER — Encounter: Payer: Self-pay | Admitting: Vascular Surgery

## 2023-06-28 ENCOUNTER — Ambulatory Visit (HOSPITAL_COMMUNITY)
Admission: RE | Admit: 2023-06-28 | Discharge: 2023-06-28 | Disposition: A | Discharge status: Home / Self Care | Payer: 59 | Source: Ambulatory Visit | Attending: Vascular Surgery | Admitting: Vascular Surgery

## 2023-06-28 VITALS — BP 106/57 | HR 70 | Temp 97.2°F | Resp 20 | Ht 62.0 in | Wt 208.1 lb

## 2023-06-28 DIAGNOSIS — N184 Chronic kidney disease, stage 4 (severe): Secondary | ICD-10-CM

## 2023-06-28 DIAGNOSIS — N186 End stage renal disease: Secondary | ICD-10-CM | POA: Diagnosis not present

## 2023-06-28 NOTE — Telephone Encounter (Signed)
 Attempted to reach pt to schedule her AVF surgery. No VM is set up; unable to leave message.

## 2023-07-01 ENCOUNTER — Other Ambulatory Visit: Payer: Self-pay

## 2023-07-01 DIAGNOSIS — N186 End stage renal disease: Secondary | ICD-10-CM

## 2023-07-11 ENCOUNTER — Other Ambulatory Visit (HOSPITAL_COMMUNITY): Payer: Self-pay

## 2023-07-15 ENCOUNTER — Encounter (HOSPITAL_COMMUNITY)
Admission: RE | Admit: 2023-07-15 | Discharge: 2023-07-15 | Disposition: A | Discharge status: Home / Self Care | Payer: 59 | Source: Ambulatory Visit | Attending: Internal Medicine | Admitting: Internal Medicine

## 2023-07-15 DIAGNOSIS — N189 Chronic kidney disease, unspecified: Secondary | ICD-10-CM | POA: Diagnosis present

## 2023-07-15 DIAGNOSIS — D631 Anemia in chronic kidney disease: Secondary | ICD-10-CM | POA: Insufficient documentation

## 2023-07-15 MED ORDER — SODIUM CHLORIDE 0.9 % IV SOLN
510.0000 mg | INTRAVENOUS | Status: DC
  Administered 2023-07-15: 510 mg via INTRAVENOUS
  Filled 2023-07-15: qty 510

## 2023-07-22 ENCOUNTER — Encounter (HOSPITAL_COMMUNITY)
Admission: RE | Admit: 2023-07-22 | Discharge: 2023-07-22 | Disposition: A | Discharge status: Home / Self Care | Payer: Medicaid Other | Source: Ambulatory Visit | Attending: Internal Medicine | Admitting: Internal Medicine

## 2023-07-22 ENCOUNTER — Other Ambulatory Visit: Payer: Self-pay

## 2023-07-22 ENCOUNTER — Encounter (HOSPITAL_COMMUNITY): Payer: Self-pay | Admitting: Vascular Surgery

## 2023-07-22 DIAGNOSIS — N189 Chronic kidney disease, unspecified: Secondary | ICD-10-CM | POA: Insufficient documentation

## 2023-07-22 DIAGNOSIS — D631 Anemia in chronic kidney disease: Secondary | ICD-10-CM | POA: Diagnosis not present

## 2023-07-22 MED ORDER — SODIUM CHLORIDE 0.9 % IV SOLN
510.0000 mg | INTRAVENOUS | Status: AC
  Administered 2023-07-22: 510 mg via INTRAVENOUS
  Filled 2023-07-22: qty 510

## 2023-07-22 NOTE — Progress Notes (Addendum)
Anesthesia Chart Review: Same day workup  53 y.o. female with past medical history of NIDDM2 (A1c 6.0 on 01/29/23), hypertension, diastolic HF, moderate pulm htn, obesity, CKD V, and intellectual disability.   Recent hospitalization 01/2023 for hypertensive urgency as well as acute right MCA ischemic stroke. Per discharge summary 02/14/23, "Symptoms did not last more than 1 hour. Code stroke activated and underwent extensive workup CT head findings> no acute findings. MRI of the brain> chronic microvascular changes along with very small acute infarct right internal capsule. Carotid Doppler, less than 39% stenosis bilateral carotids. 2D Echo-ejection fraction 50 to 55%, no source of thrombus. Neurology recommended aspirin Plavix for 3 weeks then aspirin alone."  Hospitalized again 11/8 - 05/03/23 for acute on chronic diastolic HF.  Diuresed 10L. Discharged on torsemide 100mg  BID and 2L supplemental O2.  Nephrologist is Dr. Glenna Fellows. Pt has CKD 5 from HTN, DM2, and solitary kidney. Not yet on HD.  History of anemia, received Feraheme 07/15/23.  Hemoglobin (g/dL)  Date Value  40/98/1191 8.1 (L)  05/02/2023 7.5 (L)  05/01/2023 8.0 (L)  04/30/2023 7.5 (L)  04/29/2023 7.9 (L)   Pt will need DOS labs and eval.  EKG 04/26/23: Sinus rhythm. Rate 80. Prolonged PR interval. Low voltage, extremity leads. No significant change since last tracing  TTE 01/31/23: 1. Left ventricular ejection fraction, by estimation, is 50 to 55%. The  left ventricle has low normal function. The left ventricle has no regional  wall motion abnormalities. There is moderate left ventricular hypertrophy.  Left ventricular diastolic  parameters are consistent with Grade III diastolic dysfunction  (restrictive).   2. Right ventricular systolic function is moderately reduced. The right  ventricular size is moderately enlarged. Moderately increased right  ventricular wall thickness. There is moderately elevated pulmonary artery   systolic pressure. The estimated right  ventricular systolic pressure is 48.2 mmHg.   3. Left atrial size was severely dilated.   4. Right atrial size was moderately dilated.   5. A small pericardial effusion is present. The pericardial effusion is  circumferential. There is no evidence of cardiac tamponade.   6. The mitral valve is degenerative. Mild mitral valve regurgitation. No  evidence of mitral stenosis.   7. The aortic valve is tricuspid. Aortic valve regurgitation is trivial.  No aortic stenosis is present.   8. The pulmonic valve was doming.   9. The inferior vena cava is dilated in size with <50% respiratory  variability, suggesting right atrial pressure of 15 mmHg.  10. Agitated saline contrast bubble study was positive with shunting  observed within 3-6 cardiac cycles suggestive of interatrial shunt.   Conclusion(s)/Recommendation(s): There is significan biventricular  hypertrophy with mild to moderate biventricualr dysfunction and a small  pericardial effusion. Would recommend further assessment to evalaute for  infiltrative cardiomyopathy (i.e.  amyloid). Bubble study is very weakl positive.     Zannie Cove [facility]/Anesthesiology Phone [phone] 07/22/2023 8:20 AM

## 2023-07-22 NOTE — Progress Notes (Signed)
SDW CALL  Patient and caregiver were given pre-op instructions over the phone. The opportunity was given for the patient and caregiver  to ask questions. No further questions asked. Patient and care giver verbalized understanding of instructions given.   PCP - Janee Morn at [facility] Cardiologist - care giver states that patient has not seen a cardiologist since moving back in August  PPM/ICD - denies Device Orders - n/a Rep Notified - n/a  Chest x-ray - 05/17/23 EKG - 04/26/23 Stress Test - denies ECHO - 01/31/23 Cardiac Cath - denies  Sleep Study - denies CPAP - NA  Fasting Blood Sugar - 92-102 Checks Blood Sugar ___1__ times a day  Last dose of GLP1 agonist-  n/a GLP1 instructions: n/a  Blood Thinner Instructions: n/a Aspirin Instructions: n/a  ERAS Protcol - NPO PRE-SURGERY Ensure or G2- n/a  COVID TEST-  n/a   Anesthesia review:  yes   Patient denies shortness of breath, fever, cough and chest pain over the phone call   All instructions explained to the patient, with a verbal understanding of the material. Patient agrees to go over the instructions while at home for a better understanding.

## 2023-07-22 NOTE — Anesthesia Preprocedure Evaluation (Addendum)
Anesthesia Evaluation  Patient identified by MRN, date of birth, ID band Patient awake    Reviewed: Allergy & Precautions, NPO status , Patient's Chart, lab work & pertinent test results, reviewed documented beta blocker date and time   History of Anesthesia Complications Negative for: history of anesthetic complications  Airway Mallampati: II  TM Distance: >3 FB Neck ROM: Full    Dental  (+) Dental Advisory Given, Loose, Chipped   Pulmonary neg pulmonary ROS   Pulmonary exam normal        Cardiovascular hypertension, Pt. on medications and Pt. on home beta blockers + CAD  Normal cardiovascular exam   '24 TTE - EF 50 to 55%. There is moderate left ventricular hypertrophy. Grade III diastolic dysfunction (restrictive). Right ventricular systolic function is moderately reduced. The right ventricular size is moderately enlarged. Moderately increased right ventricular wall thickness. There is moderately elevated pulmonary artery systolic pressure. The estimated right ventricular systolic pressure is 48.2 mmHg. Left atrial size was severely dilated. Right atrial size was moderately dilated. A small pericardial effusion is present. The pericardial effusion is circumferential. There is no evidence of cardiac tamponade. Mild MR. Trivial AI. The pulmonic valve was doming. Agitated saline contrast bubble study was positive with shunting observed within 3-6 cardiac cycles suggestive of interatrial shunt.      Neuro/Psych  PSYCHIATRIC DISORDERS  Depression     Intellectual disability  CVA, No Residual Symptoms    GI/Hepatic negative GI ROS, Neg liver ROS,,,  Endo/Other  diabetes, Type 2  Class 3 obesity  Renal/GU ESRFRenal disease     Musculoskeletal negative musculoskeletal ROS (+)    Abdominal  (+) + obese  Peds  Hematology  (+) Blood dyscrasia, anemia   Anesthesia Other Findings   Reproductive/Obstetrics                              Anesthesia Physical Anesthesia Plan  ASA: 4  Anesthesia Plan: Regional   Post-op Pain Management: Tylenol PO (pre-op)* and Regional block*   Induction:   PONV Risk Score and Plan: 2 and Propofol infusion and Treatment may vary due to age or medical condition  Airway Management Planned: Natural Airway and Simple Face Mask  Additional Equipment: None  Intra-op Plan:   Post-operative Plan:   Informed Consent: I have reviewed the patients History and Physical, chart, labs and discussed the procedure including the risks, benefits and alternatives for the proposed anesthesia with the patient or authorized representative who has indicated his/her understanding and acceptance.       Plan Discussed with: CRNA and Anesthesiologist  Anesthesia Plan Comments: (PAT note by Antionette Poles, PA-C)        Anesthesia Quick Evaluation

## 2023-07-23 ENCOUNTER — Other Ambulatory Visit (HOSPITAL_COMMUNITY): Payer: Self-pay

## 2023-07-23 ENCOUNTER — Other Ambulatory Visit: Payer: Self-pay

## 2023-07-23 ENCOUNTER — Ambulatory Visit (HOSPITAL_COMMUNITY)
Admission: RE | Admit: 2023-07-23 | Discharge: 2023-07-23 | Disposition: A | Discharge status: Home / Self Care | Payer: 59 | Attending: Vascular Surgery | Admitting: Vascular Surgery

## 2023-07-23 ENCOUNTER — Ambulatory Visit (HOSPITAL_COMMUNITY): Payer: 59 | Admitting: Physician Assistant

## 2023-07-23 ENCOUNTER — Encounter (HOSPITAL_COMMUNITY)
Admission: RE | Disposition: A | Discharge status: Home / Self Care | Payer: Self-pay | Source: Home / Self Care | Attending: Vascular Surgery

## 2023-07-23 DIAGNOSIS — F79 Unspecified intellectual disabilities: Secondary | ICD-10-CM | POA: Diagnosis not present

## 2023-07-23 DIAGNOSIS — E1122 Type 2 diabetes mellitus with diabetic chronic kidney disease: Secondary | ICD-10-CM | POA: Diagnosis not present

## 2023-07-23 DIAGNOSIS — I2510 Atherosclerotic heart disease of native coronary artery without angina pectoris: Secondary | ICD-10-CM | POA: Diagnosis not present

## 2023-07-23 DIAGNOSIS — N185 Chronic kidney disease, stage 5: Secondary | ICD-10-CM | POA: Insufficient documentation

## 2023-07-23 DIAGNOSIS — N186 End stage renal disease: Secondary | ICD-10-CM | POA: Diagnosis not present

## 2023-07-23 DIAGNOSIS — Z992 Dependence on renal dialysis: Secondary | ICD-10-CM | POA: Diagnosis not present

## 2023-07-23 DIAGNOSIS — I120 Hypertensive chronic kidney disease with stage 5 chronic kidney disease or end stage renal disease: Secondary | ICD-10-CM | POA: Diagnosis not present

## 2023-07-23 DIAGNOSIS — I132 Hypertensive heart and chronic kidney disease with heart failure and with stage 5 chronic kidney disease, or end stage renal disease: Secondary | ICD-10-CM | POA: Insufficient documentation

## 2023-07-23 DIAGNOSIS — Z6839 Body mass index (BMI) 39.0-39.9, adult: Secondary | ICD-10-CM | POA: Insufficient documentation

## 2023-07-23 DIAGNOSIS — I5032 Chronic diastolic (congestive) heart failure: Secondary | ICD-10-CM | POA: Insufficient documentation

## 2023-07-23 HISTORY — DX: Anemia, unspecified: D64.9

## 2023-07-23 HISTORY — DX: Depression, unspecified: F32.A

## 2023-07-23 HISTORY — PX: AV FISTULA PLACEMENT: SHX1204

## 2023-07-23 HISTORY — DX: End stage renal disease: N18.6

## 2023-07-23 LAB — GLUCOSE, CAPILLARY
Glucose-Capillary: 87 mg/dL (ref 70–99)
Glucose-Capillary: 91 mg/dL (ref 70–99)

## 2023-07-23 LAB — POCT I-STAT, CHEM 8
BUN: 80 mg/dL — ABNORMAL HIGH (ref 6–20)
Calcium, Ion: 1.21 mmol/L (ref 1.15–1.40)
Chloride: 107 mmol/L (ref 98–111)
Creatinine, Ser: 5.3 mg/dL — ABNORMAL HIGH (ref 0.44–1.00)
Glucose, Bld: 100 mg/dL — ABNORMAL HIGH (ref 70–99)
HCT: 24 % — ABNORMAL LOW (ref 36.0–46.0)
Hemoglobin: 8.2 g/dL — ABNORMAL LOW (ref 12.0–15.0)
Potassium: 5.2 mmol/L — ABNORMAL HIGH (ref 3.5–5.1)
Sodium: 138 mmol/L (ref 135–145)
TCO2: 21 mmol/L — ABNORMAL LOW (ref 22–32)

## 2023-07-23 LAB — POCT PREGNANCY, URINE: Preg Test, Ur: NEGATIVE

## 2023-07-23 SURGERY — ARTERIOVENOUS (AV) FISTULA CREATION
Anesthesia: Regional | Site: Arm Upper | Laterality: Left

## 2023-07-23 MED ORDER — ONDANSETRON HCL 4 MG/2ML IJ SOLN
INTRAMUSCULAR | Status: DC | PRN
  Administered 2023-07-23: 4 mg via INTRAVENOUS

## 2023-07-23 MED ORDER — MIDAZOLAM HCL 2 MG/2ML IJ SOLN
INTRAMUSCULAR | Status: AC
  Filled 2023-07-23: qty 2

## 2023-07-23 MED ORDER — CHLORHEXIDINE GLUCONATE 4 % EX SOLN: 60.0000 mL | Freq: Once | CUTANEOUS | Status: DC

## 2023-07-23 MED ORDER — FENTANYL CITRATE (PF) 250 MCG/5ML IJ SOLN
INTRAMUSCULAR | Status: AC
  Filled 2023-07-23: qty 5

## 2023-07-23 MED ORDER — PROPOFOL 500 MG/50ML IV EMUL
INTRAVENOUS | Status: DC | PRN
  Administered 2023-07-23: 100 ug/kg/min via INTRAVENOUS

## 2023-07-23 MED ORDER — MIDAZOLAM HCL 2 MG/2ML IJ SOLN
INTRAMUSCULAR | Status: DC | PRN
  Administered 2023-07-23: 1 mg via INTRAVENOUS

## 2023-07-23 MED ORDER — ORAL CARE MOUTH RINSE: 15.0000 mL | Freq: Once | OROMUCOSAL | Status: AC

## 2023-07-23 MED ORDER — INSULIN ASPART 100 UNIT/ML IJ SOLN: 0.0000 [IU] | INTRAMUSCULAR | Status: DC | PRN

## 2023-07-23 MED ORDER — CEFAZOLIN SODIUM-DEXTROSE 2-4 GM/100ML-% IV SOLN
2.0000 g | INTRAVENOUS | Status: AC
  Administered 2023-07-23: 2 g via INTRAVENOUS
  Filled 2023-07-23: qty 100

## 2023-07-23 MED ORDER — SODIUM CHLORIDE 0.9 % IV SOLN: INTRAVENOUS | Status: DC | PRN

## 2023-07-23 MED ORDER — SODIUM CHLORIDE 0.9% FLUSH: 3.0000 mL | Freq: Two times a day (BID) | INTRAVENOUS | Status: DC

## 2023-07-23 MED ORDER — SODIUM CHLORIDE 0.9% FLUSH: 3.0000 mL | INTRAVENOUS | Status: DC | PRN

## 2023-07-23 MED ORDER — LIDOCAINE HCL (PF) 1 % IJ SOLN
INTRAMUSCULAR | Status: AC
  Filled 2023-07-23: qty 30

## 2023-07-23 MED ORDER — PHENYLEPHRINE HCL-NACL 20-0.9 MG/250ML-% IV SOLN
INTRAVENOUS | Status: DC | PRN
  Administered 2023-07-23: 20 ug/min via INTRAVENOUS

## 2023-07-23 MED ORDER — MEPIVACAINE HCL (PF) 1.5 % IJ SOLN
INTRAMUSCULAR | Status: DC | PRN
  Administered 2023-07-23: 20 mL via PERINEURAL

## 2023-07-23 MED ORDER — FENTANYL CITRATE (PF) 250 MCG/5ML IJ SOLN
INTRAMUSCULAR | Status: DC | PRN
  Administered 2023-07-23: 50 ug via INTRAVENOUS

## 2023-07-23 MED ORDER — HEPARIN 6000 UNIT IRRIGATION SOLUTION
Status: AC
  Filled 2023-07-23: qty 500

## 2023-07-23 MED ORDER — FENTANYL CITRATE (PF) 100 MCG/2ML IJ SOLN: 25.0000 ug | INTRAMUSCULAR | Status: DC | PRN

## 2023-07-23 MED ORDER — OXYCODONE-ACETAMINOPHEN 5-325 MG PO TABS
1.0000 | ORAL_TABLET | Freq: Four times a day (QID) | ORAL | 0 refills | Status: DC | PRN
  Filled 2023-07-23: qty 12, 3d supply, fill #0

## 2023-07-23 MED ORDER — HEPARIN SODIUM (PORCINE) 1000 UNIT/ML IJ SOLN
INTRAMUSCULAR | Status: DC | PRN
  Administered 2023-07-23: 3000 [IU] via INTRAVENOUS

## 2023-07-23 MED ORDER — PHENYLEPHRINE HCL (PRESSORS) 10 MG/ML IV SOLN
INTRAVENOUS | Status: DC | PRN
  Administered 2023-07-23 (×2): 80 ug via INTRAVENOUS

## 2023-07-23 MED ORDER — OXYCODONE HCL 5 MG/5ML PO SOLN: 5.0000 mg | Freq: Once | ORAL | Status: DC | PRN

## 2023-07-23 MED ORDER — HEMOSTATIC AGENTS (NO CHARGE) OPTIME
TOPICAL | Status: DC | PRN
  Administered 2023-07-23: 1 via TOPICAL

## 2023-07-23 MED ORDER — DEXAMETHASONE SODIUM PHOSPHATE 10 MG/ML IJ SOLN
INTRAMUSCULAR | Status: DC | PRN
  Administered 2023-07-23: 5 mg via INTRAVENOUS

## 2023-07-23 MED ORDER — EPHEDRINE SULFATE (PRESSORS) 50 MG/ML IJ SOLN
INTRAMUSCULAR | Status: DC | PRN
  Administered 2023-07-23 (×3): 5 mg via INTRAVENOUS

## 2023-07-23 MED ORDER — ONDANSETRON HCL 4 MG/2ML IJ SOLN: 4.0000 mg | Freq: Once | INTRAMUSCULAR | Status: DC | PRN

## 2023-07-23 MED ORDER — CHLORHEXIDINE GLUCONATE 0.12 % MT SOLN
15.0000 mL | Freq: Once | OROMUCOSAL | Status: AC
  Administered 2023-07-23: 15 mL via OROMUCOSAL
  Filled 2023-07-23: qty 15

## 2023-07-23 MED ORDER — HEPARIN 6000 UNIT IRRIGATION SOLUTION
Status: DC | PRN
  Administered 2023-07-23: 1

## 2023-07-23 MED ORDER — OXYCODONE HCL 5 MG PO TABS
5.0000 mg | ORAL_TABLET | Freq: Once | ORAL | Status: DC | PRN
Start: 2023-07-23 — End: 2023-07-23

## 2023-07-23 MED ORDER — OXYCODONE-ACETAMINOPHEN 5-325 MG PO TABS: 1.0000 | ORAL_TABLET | Freq: Four times a day (QID) | ORAL | 0 refills | Status: DC | PRN

## 2023-07-23 MED ORDER — 0.9 % SODIUM CHLORIDE (POUR BTL) OPTIME
TOPICAL | Status: DC | PRN
  Administered 2023-07-23: 1000 mL

## 2023-07-23 SURGICAL SUPPLY — 29 items
ARMBAND PINK RESTRICT EXTREMIT (MISCELLANEOUS) ×1 IMPLANT
BAG COUNTER SPONGE SURGICOUNT (BAG) ×1 IMPLANT
CANISTER SUCT 3000ML PPV (MISCELLANEOUS) ×1 IMPLANT
CLIP TI MEDIUM 6 (CLIP) ×1 IMPLANT
CLIP TI WIDE RED SMALL 6 (CLIP) ×1 IMPLANT
COVER PROBE W GEL 5X96 (DRAPES) IMPLANT
DERMABOND ADVANCED .7 DNX12 (GAUZE/BANDAGES/DRESSINGS) ×1 IMPLANT
ELECT REM PT RETURN 9FT ADLT (ELECTROSURGICAL) ×1 IMPLANT
ELECTRODE REM PT RTRN 9FT ADLT (ELECTROSURGICAL) ×1 IMPLANT
GLOVE BIOGEL PI IND STRL 7.0 (GLOVE) ×1 IMPLANT
GOWN STRL REUS W/ TWL LRG LVL3 (GOWN DISPOSABLE) ×2 IMPLANT
GOWN STRL REUS W/ TWL XL LVL3 (GOWN DISPOSABLE) ×1 IMPLANT
HEMOSTAT SNOW SURGICEL 2X4 (HEMOSTASIS) IMPLANT
INSERT FOGARTY SM (MISCELLANEOUS) IMPLANT
KIT BASIN OR (CUSTOM PROCEDURE TRAY) ×1 IMPLANT
KIT TURNOVER KIT B (KITS) ×1 IMPLANT
LOOP VESSEL MINI RED (MISCELLANEOUS) IMPLANT
NS IRRIG 1000ML POUR BTL (IV SOLUTION) ×1 IMPLANT
PACK CV ACCESS (CUSTOM PROCEDURE TRAY) ×1 IMPLANT
PAD ARMBOARD 7.5X6 YLW CONV (MISCELLANEOUS) ×2 IMPLANT
POWDER SURGICEL 3.0 GRAM (HEMOSTASIS) IMPLANT
SLING ARM FOAM STRAP LRG (SOFTGOODS) IMPLANT
SLING ARM FOAM STRAP MED (SOFTGOODS) IMPLANT
SUT MNCRL AB 4-0 PS2 18 (SUTURE) ×1 IMPLANT
SUT PROLENE 6 0 BV (SUTURE) ×1 IMPLANT
SUT VIC AB 3-0 SH 27X BRD (SUTURE) ×1 IMPLANT
TOWEL GREEN STERILE (TOWEL DISPOSABLE) ×1 IMPLANT
UNDERPAD 30X36 HEAVY ABSORB (UNDERPADS AND DIAPERS) ×1 IMPLANT
WATER STERILE IRR 1000ML POUR (IV SOLUTION) ×1 IMPLANT

## 2023-07-23 NOTE — Transfer of Care (Signed)
 Immediate Anesthesia Transfer of Care Note  Patient: Joanna [name]  Procedure(s) Performed: LEFT ARM ARTERIOVENOUS (AV) FISTULA CREATION (Left)  Patient Location: PACU  Anesthesia Type:MAC and Regional  Level of Consciousness: drowsy  Airway & Oxygen Therapy: Patient Spontanous Breathing and Patient connected to nasal cannula oxygen  Post-op Assessment: Report given to RN, Post -op Vital signs reviewed and stable, and Patient moving all extremities  Post vital signs: Reviewed and stable  Last Vitals:  Vitals Value Taken Time  BP 125/66 07/23/23 0909  Temp    Pulse 69 07/23/23 0911  Resp 19 07/23/23 0911  SpO2 96 % 07/23/23 0911  Vitals shown include unfiled device data.  Last Pain:  Vitals:   07/23/23 0637  TempSrc:   PainSc: 0-No pain         Complications: No notable events documented.

## 2023-07-23 NOTE — Op Note (Signed)
    OPERATIVE NOTE  PROCEDURE:   Intraoperative left arm vein mapping left arm brachiocephalic AVF creation  PRE-OPERATIVE DIAGNOSIS: CKD stage V  POST-OPERATIVE DIAGNOSIS: same as above   SURGEON: Norman GORMAN Serve MD  ASSISTANT(S): Deland Collet, PA  Given the complexity of the case,  the assistant was necessary in order to expedient the procedure and safely perform the technical aspects of the operation.  The assistant provided traction and countertraction to assist with exposure of the artery and vein.  They also assisted with suture ligation of multiple venous branches.  They played a critical role in the anastomosis. These skills, especially following the Prolene suture for the anastomosis, could not have been adequately performed by a scrub tech assistant.   ANESTHESIA: regional  ESTIMATED BLOOD LOSS: 15 cc  FINDING(S): Sufficiently sized left arm cephalic vein throughout its course although slightly diseased in [facility] Sufficiently sized left brachial artery Palpable and doppler thrill in AVF with multiphasic radial artery on completion  SPECIMEN(S):  none  INDICATIONS:   Joanna [name] is a 53 y.o. female with CKD stage V. The patient is not currently on dialysis. They were seen in the office for evaluation of hemodialysis access. The risks an benefits including of access creation were reviewed including: need for additional procedures, need for additional creations, steal, ischemia monomelic neuropathy, failure of access, and bleeding. The patient expressed understand and is willing to proceed.    DESCRIPTION: The patient was brought to the operating room positioned supine on the operating table.  The left arm was prepped and draped in usual sterile fashion.  Timeout was performed and preoperative antibiotics were administered.  The case began with ultrasound mapping of the brachial artery and cephalic vein, which demonstrated sufficient size at the antecubital fossa for  arteriovenous fistula.  A transverse incision was made 1 finger breadth below the elbow creese in the antecubital fossa. The  cephalic vein was isolated for 3 cm in length. Next the aponeurosis was partially released and the brachial artery secured with a vessel loop. The patient was heparinized. The cephalic vein was transected and ligated distally with a 2-0 silk and vascular clip. The vein was dilated and flushed with heparin  saline. Vascular clamps were placed proximally and distally on the brachial artery and an approximate 6 mm arteriotomy was created on the brachial artery. This was flushed with heparin  saline.  An anastomosis was created in end to side fashion on the brachial artery using running 6-0 Prolene suture. Prior to completing the anastomosis, the vessels were flushed and the suture line was tied down. There was an excellent thrill in the cephalic vein from the anastomosis to the mid upper arm. The patient had a multiphasic radial signal and had an excellent doppler signal in the fistula. The incision was irrigated and hemostasis achieved with cautery and suture. The deeper tissue was closed with 3-0 Vicryl and the skin closed with 4-0 Monocryl. Dermabond was applied to the incisions. The patient was transferred to PACU in stable condition.  COMPLICATIONS: none apparent  CONDITION: good  Norman GORMAN Serve MD [facility] Phone Number: [phone] 07/23/2023 8:59 AM

## 2023-07-23 NOTE — Progress Notes (Signed)
      Mr. Felicity Pellegrini Brought his aunt Ms. Daya Dutt to the hospital at [facility] for surgery today and needs to be out of work today 07/23/23.      [facility] [phone] Mosetta Pigeon PA-C

## 2023-07-23 NOTE — Discharge Instructions (Signed)

## 2023-07-23 NOTE — Anesthesia Procedure Notes (Signed)
Anesthesia Regional Block: Supraclavicular block   Pre-Anesthetic Checklist: , timeout performed,  Correct Patient, Correct Site, Correct Laterality,  Correct Procedure, Correct Position, site marked,  Risks and benefits discussed,  Surgical consent,  Pre-op evaluation,  At surgeon's request and post-op pain management  Laterality: Left  Prep: chloraprep       Needles:  Injection technique: Single-shot  Needle Type: Echogenic Needle     Needle Length: 5cm  Needle Gauge: 21     Additional Needles:   Narrative:  Start time: 07/23/2023 7:10 AM End time: 07/23/2023 7:13 AM Injection made incrementally with aspirations every 5 mL.  Performed by: Personally  Anesthesiologist: Beryle Lathe, MD  Additional Notes: No pain on injection. No increased resistance to injection. Injection made in 5cc increments. Good needle visualization. Patient tolerated the procedure well.

## 2023-07-23 NOTE — Anesthesia Postprocedure Evaluation (Signed)
 Anesthesia Post Note  Patient: Joanna [name]  Procedure(s) Performed: LEFT ARM BRACHIOCEPHALIC ARTERIOVENOUS (AV) FISTULA CREATION (Left: Arm Upper)     Patient location during evaluation: PACU Anesthesia Type: Regional Level of consciousness: awake and alert Pain management: pain level controlled Vital Signs Assessment: post-procedure vital signs reviewed and stable Respiratory status: spontaneous breathing, nonlabored ventilation, respiratory function stable and patient connected to nasal cannula oxygen Cardiovascular status: stable and blood pressure returned to baseline Anesthetic complications: no   No notable events documented.  Last Vitals:  Vitals:   07/23/23 0930 07/23/23 0945  BP: 124/76 125/72  Pulse: 68 68  Resp: 14 (!) 23  Temp:  (!) 36.2 C  SpO2: 95% 93%    Last Pain:  Vitals:   07/23/23 0930  TempSrc:   PainSc: 0-No pain                 Debby FORBES Like

## 2023-07-23 NOTE — Interval H&P Note (Signed)
 History and Physical Interval Note:  07/23/2023 6:59 AM  Joanna [name]  has presented today for surgery, with the diagnosis of End Stage Renal Disease.  The various methods of treatment have been discussed with the patient and family. After consideration of risks, benefits and other options for treatment, the patient has consented to  Procedure(s): LEFT ARM ARTERIOVENOUS (AV) FISTULA CREATION (Left) as a surgical intervention.  The patient's history has been reviewed, patient examined, no change in status, stable for surgery.  I have reviewed the patient's chart and labs.  Questions were answered to the patient's satisfaction.     Norman GORMAN Serve

## 2023-07-24 ENCOUNTER — Encounter (HOSPITAL_COMMUNITY): Payer: Self-pay | Admitting: Vascular Surgery

## 2023-08-21 ENCOUNTER — Encounter (HOSPITAL_COMMUNITY)
Admission: RE | Admit: 2023-08-21 | Discharge: 2023-08-21 | Disposition: A | Discharge status: Home / Self Care | Payer: 59 | Source: Ambulatory Visit | Attending: Internal Medicine | Admitting: Internal Medicine

## 2023-08-21 VITALS — BP 133/69 | HR 72 | Temp 97.1°F | Resp 17

## 2023-08-21 DIAGNOSIS — N184 Chronic kidney disease, stage 4 (severe): Secondary | ICD-10-CM | POA: Insufficient documentation

## 2023-08-21 LAB — POCT HEMOGLOBIN-HEMACUE: Hemoglobin: 7.5 g/dL — ABNORMAL LOW (ref 12.0–15.0)

## 2023-08-21 MED ORDER — EPOETIN ALFA-EPBX 10000 UNIT/ML IJ SOLN
INTRAMUSCULAR | Status: AC
  Filled 2023-08-21: qty 2

## 2023-08-21 MED ORDER — EPOETIN ALFA-EPBX 10000 UNIT/ML IJ SOLN
20000.0000 [IU] | INTRAMUSCULAR | Status: DC
  Administered 2023-08-21: 20000 [IU] via SUBCUTANEOUS

## 2023-08-23 ENCOUNTER — Other Ambulatory Visit (HOSPITAL_BASED_OUTPATIENT_CLINIC_OR_DEPARTMENT_OTHER): Payer: Self-pay | Admitting: Student

## 2023-08-23 ENCOUNTER — Ambulatory Visit (HOSPITAL_BASED_OUTPATIENT_CLINIC_OR_DEPARTMENT_OTHER)
Admission: RE | Admit: 2023-08-23 | Discharge: 2023-08-23 | Disposition: A | Discharge status: Home / Self Care | Source: Ambulatory Visit | Attending: Student | Admitting: Student

## 2023-08-23 DIAGNOSIS — G8929 Other chronic pain: Secondary | ICD-10-CM

## 2023-08-23 DIAGNOSIS — M5489 Other dorsalgia: Secondary | ICD-10-CM | POA: Diagnosis present

## 2023-08-23 DIAGNOSIS — R053 Chronic cough: Secondary | ICD-10-CM | POA: Diagnosis present

## 2023-08-26 ENCOUNTER — Other Ambulatory Visit: Payer: Self-pay

## 2023-08-26 DIAGNOSIS — N186 End stage renal disease: Secondary | ICD-10-CM

## 2023-08-28 ENCOUNTER — Encounter (HOSPITAL_COMMUNITY)

## 2023-08-30 ENCOUNTER — Ambulatory Visit (HOSPITAL_COMMUNITY)
Admission: RE | Admit: 2023-08-30 | Discharge: 2023-08-30 | Disposition: A | Discharge status: Home / Self Care | Payer: 59 | Source: Ambulatory Visit | Attending: Vascular Surgery | Admitting: Vascular Surgery

## 2023-08-30 ENCOUNTER — Ambulatory Visit (INDEPENDENT_AMBULATORY_CARE_PROVIDER_SITE_OTHER): Payer: 59 | Admitting: Physician Assistant

## 2023-08-30 VITALS — BP 134/85 | Temp 98.0°F | Ht 61.0 in | Wt 196.1 lb

## 2023-08-30 DIAGNOSIS — N186 End stage renal disease: Secondary | ICD-10-CM | POA: Diagnosis present

## 2023-08-30 DIAGNOSIS — N184 Chronic kidney disease, stage 4 (severe): Secondary | ICD-10-CM

## 2023-08-30 NOTE — H&P (View-Only) (Signed)
 Office Note     CC:  follow up Requesting Provider:  Hillery Aldo, NP  HPI: Joanna [name] is a 53 y.o. ([birth year]) female who presents status post left brachiocephalic fistula creation by Dr. Hetty Blend on 07/23/23.  She is not yet on hemodialysis.  She follows regularly with her nephrologist every 6 weeks for lab work.  She believes her left arm incision has healed.  She denies steal symptoms in her left hand.  She is on aspirin daily.   Past Medical History:  Diagnosis Date   Acute diastolic heart failure (HCC) 12/16/2019   Acute respiratory failure with hypoxia (HCC) 12/16/2019   AKI (acute kidney injury) (HCC) 12/16/2019   Anasarca 12/16/2019   Anemia    CAD (coronary artery disease)    Depression    Diabetes mellitus without complication (HCC)    HTN (hypertension) 12/16/2019   Hypertension    Hypertensive urgency 12/16/2019   Intellectual disability 12/16/2019   Morbid obesity (HCC)    New onset of congestive heart failure (HCC) 12/16/2019   Obesity, Class III, BMI 40-49.9 (morbid obesity) (HCC) 12/16/2019   Type 2 diabetes mellitus without complication (HCC) 12/16/2019    Past Surgical History:  Procedure Laterality Date   AV FISTULA PLACEMENT Left 07/23/2023   Procedure: LEFT ARM BRACHIOCEPHALIC ARTERIOVENOUS (AV) FISTULA CREATION;  Surgeon: Daria Pastures, MD;  Location: [facility] OR;  Service: Vascular;  Laterality: Left;   CATARACT EXTRACTION Bilateral     Social History   Socioeconomic History   Marital status: Single    Spouse name: Not on file   Number of children: 0   Years of education: Not on file   Highest education level: 9th grade  Occupational History   Not on file  Tobacco Use   Smoking status: Never   Smokeless tobacco: Never  Vaping Use   Vaping status: Never Used  Substance and Sexual Activity   Alcohol use: Not Currently    Comment: socially   Drug use: Never   Sexual activity: Not on file  Other Topics Concern   Not on file  Social History  Narrative   07/26/21 lives with caregiver   Social Drivers of Health   Financial Resource Strain: Medium Risk (11/30/2022)   Received from [facility]   Overall Financial Resource Strain (CARDIA)    Difficulty of Paying Living Expenses: Somewhat hard  Food Insecurity: No Food Insecurity (04/27/2023)   Hunger Vital Sign    Worried About Running Out of Food in the Last Year: Never true    Ran Out of Food in the Last Year: Never true  Transportation Needs: No Transportation Needs (04/27/2023)   PRAPARE - Administrator, Civil Service (Medical): No    Lack of Transportation (Non-Medical): No  Physical Activity: Insufficiently Active (11/30/2022)   Received from [facility]   Exercise Vital Sign    Days of Exercise per Week: 2 days    Minutes of Exercise per Session: 20 min  Stress: No Stress Concern Present (11/30/2022)   Received from [facility] - Occupational Stress Questionnaire    Feeling of Stress : Not at all  Social Connections: Socially Integrated (11/30/2022)   Received from [facility]   Social Network    How would you rate your social network (family, work, friends)?: Good participation with social networks  Intimate Partner Violence: Not At Risk (04/27/2023)   Humiliation, Afraid, Rape, and Kick questionnaire    Fear of  Current or Ex-Partner: No    Emotionally Abused: No    Physically Abused: No    Sexually Abused: No    Family History  Problem Relation Age of Onset   Heart disease Mother        Enlarged heart per patient   Hypertension Mother    Diabetes Mother    Hypertension Sister    Diabetes Sister     Current Outpatient Medications  Medication Sig Dispense Refill   acetaminophen (TYLENOL) 325 MG tablet Take 650 mg by mouth every 6 (six) hours as needed for moderate pain (pain score 4-6).     allopurinol (ZYLOPRIM) 100 MG tablet Take 50 mg by mouth daily.     amLODipine (NORVASC) 10 MG tablet Take 1  tablet (10 mg total) by mouth daily. 30 tablet 0   aspirin EC 81 MG EC tablet Take 1 tablet (81 mg total) by mouth daily. Swallow whole. 30 tablet 11   atorvastatin (LIPITOR) 40 MG tablet Take 1 tablet (40 mg total) by mouth daily. 30 tablet 0   calcitRIOL (ROCALTROL) 0.25 MCG capsule Take 0.25 mcg by mouth daily.     carvedilol (COREG) 12.5 MG tablet Take 1 tablet (12.5 mg total) by mouth 2 (two) times daily with a meal. 60 tablet 0   hydrALAZINE (APRESOLINE) 100 MG tablet Take 1 tablet (100 mg total) by mouth every 8 (eight) hours. (Patient taking differently: Take 100 mg by mouth 2 (two) times daily.) 30 tablet 0   isosorbide mononitrate (IMDUR) 120 MG 24 hr tablet Take 1 tablet (120 mg total) by mouth daily. 30 tablet 0   MOUNJARO 2.5 MG/0.5ML Pen INJECT 2.5 MG INTO THE SKIN ONCE A WEEK FOR 4 WEEKS THEN INCREASE TO 5 MG WEEKLY     torsemide (DEMADEX) 20 MG tablet Take 60 mg by mouth daily.     No current facility-administered medications for this visit.    No Known Allergies   REVIEW OF SYSTEMS:   [X]  denotes positive finding, [ ]  denotes negative finding Cardiac  Comments:  Chest pain or chest pressure:    Shortness of breath upon exertion:    Short of breath when lying flat:    Irregular heart rhythm:        Vascular    Pain in calf, thigh, or hip brought on by ambulation:    Pain in feet at night that wakes you up from your sleep:     Blood clot in your veins:    Leg swelling:         Pulmonary    Oxygen at home:    Productive cough:     Wheezing:         Neurologic    Sudden weakness in arms or legs:     Sudden numbness in arms or legs:     Sudden onset of difficulty speaking or slurred speech:    Temporary loss of vision in one eye:     Problems with dizziness:         Gastrointestinal    Blood in stool:     Vomited blood:         Genitourinary    Burning when urinating:     Blood in urine:        Psychiatric    Major depression:         Hematologic     Bleeding problems:    Problems with blood clotting too easily:  Skin    Rashes or ulcers:        Constitutional    Fever or chills:      PHYSICAL EXAMINATION:  Vitals:   08/30/23 0944  BP: 134/85  Temp: 98 F (36.7 C)  TempSrc: Temporal  SpO2: 98%  Weight: 196 lb 1.6 oz (89 kg)  Height: 5\' 1"  (1.549 m)  PF: (!) 2 L/min    General:  WDWN in NAD; vital signs documented above Gait: Not observed HENT: WNL, normocephalic Pulmonary: normal non-labored breathing , without Rales, rhonchi,  wheezing Cardiac: regular HR Abdomen: soft, NT, no masses Skin: without rashes Vascular Exam/Pulses: palpable L radial pulse Extremities: Easily palpable thrill near the anastomosis however fistula runs 6 mm in depth in the distal and mid upper arm Musculoskeletal: no muscle wasting or atrophy  Neurologic: A&O X 3 Psychiatric:  The pt has Normal affect.   Non-Invasive Vascular Imaging:   Patent left arm brachiocephalic fistula with diameter greater than 6 mm and depth red around 6 mm    ASSESSMENT/PLAN:: 53 y.o. female status post left brachiocephalic fistula creation  Patent left arm brachiocephalic fistula with a palpable thrill.  No signs or symptoms of steal syndrome in the left hand and she has a palpable radial pulse.  Fistula has matured nicely however lies borderline to deep to reliably cannulate during dialysis based on physical exam as well as duplex.  Plan will be to proceed with superficialization of left arm AV fistula with Dr. Hetty Blend in the near future.  This was discussed with the patient and her caretaker and they are agreeable to proceed.  She is not yet on hemodialysis.   Emilie Rutter, PA-C [facility] [phone]  Clinic MD:   Hetty Blend

## 2023-08-30 NOTE — Progress Notes (Signed)
 Office Note     CC:  follow up Requesting Provider:  Hillery Aldo, NP  HPI: Joanna [name] is a 53 y.o. ([birth year]) female who presents status post left brachiocephalic fistula creation by Dr. Hetty Blend on 07/23/23.  She is not yet on hemodialysis.  She follows regularly with her nephrologist every 6 weeks for lab work.  She believes her left arm incision has healed.  She denies steal symptoms in her left hand.  She is on aspirin daily.   Past Medical History:  Diagnosis Date   Acute diastolic heart failure (HCC) 12/16/2019   Acute respiratory failure with hypoxia (HCC) 12/16/2019   AKI (acute kidney injury) (HCC) 12/16/2019   Anasarca 12/16/2019   Anemia    CAD (coronary artery disease)    Depression    Diabetes mellitus without complication (HCC)    HTN (hypertension) 12/16/2019   Hypertension    Hypertensive urgency 12/16/2019   Intellectual disability 12/16/2019   Morbid obesity (HCC)    New onset of congestive heart failure (HCC) 12/16/2019   Obesity, Class III, BMI 40-49.9 (morbid obesity) (HCC) 12/16/2019   Type 2 diabetes mellitus without complication (HCC) 12/16/2019    Past Surgical History:  Procedure Laterality Date   AV FISTULA PLACEMENT Left 07/23/2023   Procedure: LEFT ARM BRACHIOCEPHALIC ARTERIOVENOUS (AV) FISTULA CREATION;  Surgeon: Daria Pastures, MD;  Location: [facility] OR;  Service: Vascular;  Laterality: Left;   CATARACT EXTRACTION Bilateral     Social History   Socioeconomic History   Marital status: Single    Spouse name: Not on file   Number of children: 0   Years of education: Not on file   Highest education level: 9th grade  Occupational History   Not on file  Tobacco Use   Smoking status: Never   Smokeless tobacco: Never  Vaping Use   Vaping status: Never Used  Substance and Sexual Activity   Alcohol use: Not Currently    Comment: socially   Drug use: Never   Sexual activity: Not on file  Other Topics Concern   Not on file  Social History  Narrative   07/26/21 lives with caregiver   Social Drivers of Health   Financial Resource Strain: Medium Risk (11/30/2022)   Received from [facility]   Overall Financial Resource Strain (CARDIA)    Difficulty of Paying Living Expenses: Somewhat hard  Food Insecurity: No Food Insecurity (04/27/2023)   Hunger Vital Sign    Worried About Running Out of Food in the Last Year: Never true    Ran Out of Food in the Last Year: Never true  Transportation Needs: No Transportation Needs (04/27/2023)   PRAPARE - Administrator, Civil Service (Medical): No    Lack of Transportation (Non-Medical): No  Physical Activity: Insufficiently Active (11/30/2022)   Received from [facility]   Exercise Vital Sign    Days of Exercise per Week: 2 days    Minutes of Exercise per Session: 20 min  Stress: No Stress Concern Present (11/30/2022)   Received from [facility] - Occupational Stress Questionnaire    Feeling of Stress : Not at all  Social Connections: Socially Integrated (11/30/2022)   Received from [facility]   Social Network    How would you rate your social network (family, work, friends)?: Good participation with social networks  Intimate Partner Violence: Not At Risk (04/27/2023)   Humiliation, Afraid, Rape, and Kick questionnaire    Fear of  Current or Ex-Partner: No    Emotionally Abused: No    Physically Abused: No    Sexually Abused: No    Family History  Problem Relation Age of Onset   Heart disease Mother        Enlarged heart per patient   Hypertension Mother    Diabetes Mother    Hypertension Sister    Diabetes Sister     Current Outpatient Medications  Medication Sig Dispense Refill   acetaminophen (TYLENOL) 325 MG tablet Take 650 mg by mouth every 6 (six) hours as needed for moderate pain (pain score 4-6).     allopurinol (ZYLOPRIM) 100 MG tablet Take 50 mg by mouth daily.     amLODipine (NORVASC) 10 MG tablet Take 1  tablet (10 mg total) by mouth daily. 30 tablet 0   aspirin EC 81 MG EC tablet Take 1 tablet (81 mg total) by mouth daily. Swallow whole. 30 tablet 11   atorvastatin (LIPITOR) 40 MG tablet Take 1 tablet (40 mg total) by mouth daily. 30 tablet 0   calcitRIOL (ROCALTROL) 0.25 MCG capsule Take 0.25 mcg by mouth daily.     carvedilol (COREG) 12.5 MG tablet Take 1 tablet (12.5 mg total) by mouth 2 (two) times daily with a meal. 60 tablet 0   hydrALAZINE (APRESOLINE) 100 MG tablet Take 1 tablet (100 mg total) by mouth every 8 (eight) hours. (Patient taking differently: Take 100 mg by mouth 2 (two) times daily.) 30 tablet 0   isosorbide mononitrate (IMDUR) 120 MG 24 hr tablet Take 1 tablet (120 mg total) by mouth daily. 30 tablet 0   MOUNJARO 2.5 MG/0.5ML Pen INJECT 2.5 MG INTO THE SKIN ONCE A WEEK FOR 4 WEEKS THEN INCREASE TO 5 MG WEEKLY     torsemide (DEMADEX) 20 MG tablet Take 60 mg by mouth daily.     No current facility-administered medications for this visit.    No Known Allergies   REVIEW OF SYSTEMS:   [X]  denotes positive finding, [ ]  denotes negative finding Cardiac  Comments:  Chest pain or chest pressure:    Shortness of breath upon exertion:    Short of breath when lying flat:    Irregular heart rhythm:        Vascular    Pain in calf, thigh, or hip brought on by ambulation:    Pain in feet at night that wakes you up from your sleep:     Blood clot in your veins:    Leg swelling:         Pulmonary    Oxygen at home:    Productive cough:     Wheezing:         Neurologic    Sudden weakness in arms or legs:     Sudden numbness in arms or legs:     Sudden onset of difficulty speaking or slurred speech:    Temporary loss of vision in one eye:     Problems with dizziness:         Gastrointestinal    Blood in stool:     Vomited blood:         Genitourinary    Burning when urinating:     Blood in urine:        Psychiatric    Major depression:         Hematologic     Bleeding problems:    Problems with blood clotting too easily:  Skin    Rashes or ulcers:        Constitutional    Fever or chills:      PHYSICAL EXAMINATION:  Vitals:   08/30/23 0944  BP: 134/85  Temp: 98 F (36.7 C)  TempSrc: Temporal  SpO2: 98%  Weight: 196 lb 1.6 oz (89 kg)  Height: 5\' 1"  (1.549 m)  PF: (!) 2 L/min    General:  WDWN in NAD; vital signs documented above Gait: Not observed HENT: WNL, normocephalic Pulmonary: normal non-labored breathing , without Rales, rhonchi,  wheezing Cardiac: regular HR Abdomen: soft, NT, no masses Skin: without rashes Vascular Exam/Pulses: palpable L radial pulse Extremities: Easily palpable thrill near the anastomosis however fistula runs 6 mm in depth in the distal and mid upper arm Musculoskeletal: no muscle wasting or atrophy  Neurologic: A&O X 3 Psychiatric:  The pt has Normal affect.   Non-Invasive Vascular Imaging:   Patent left arm brachiocephalic fistula with diameter greater than 6 mm and depth red around 6 mm    ASSESSMENT/PLAN:: 53 y.o. female status post left brachiocephalic fistula creation  Patent left arm brachiocephalic fistula with a palpable thrill.  No signs or symptoms of steal syndrome in the left hand and she has a palpable radial pulse.  Fistula has matured nicely however lies borderline to deep to reliably cannulate during dialysis based on physical exam as well as duplex.  Plan will be to proceed with superficialization of left arm AV fistula with Dr. Hetty Blend in the near future.  This was discussed with the patient and her caretaker and they are agreeable to proceed.  She is not yet on hemodialysis.   Emilie Rutter, PA-C [facility] [phone]  Clinic MD:   Hetty Blend

## 2023-09-04 ENCOUNTER — Encounter (HOSPITAL_COMMUNITY)
Admission: RE | Admit: 2023-09-04 | Discharge: 2023-09-04 | Disposition: A | Discharge status: Home / Self Care | Source: Ambulatory Visit | Attending: Internal Medicine | Admitting: Internal Medicine

## 2023-09-04 VITALS — BP 119/63 | HR 72 | Temp 97.3°F | Resp 17

## 2023-09-04 DIAGNOSIS — N184 Chronic kidney disease, stage 4 (severe): Secondary | ICD-10-CM

## 2023-09-04 LAB — POCT HEMOGLOBIN-HEMACUE: Hemoglobin: 8.5 g/dL — ABNORMAL LOW (ref 12.0–15.0)

## 2023-09-04 MED ORDER — EPOETIN ALFA-EPBX 10000 UNIT/ML IJ SOLN: 20000.0000 [IU] | INTRAMUSCULAR | Status: DC

## 2023-09-04 MED ORDER — EPOETIN ALFA-EPBX 10000 UNIT/ML IJ SOLN
INTRAMUSCULAR | Status: AC
  Administered 2023-09-04: 20000 [IU] via SUBCUTANEOUS
  Filled 2023-09-04: qty 2

## 2023-09-05 ENCOUNTER — Other Ambulatory Visit: Payer: Self-pay

## 2023-09-05 DIAGNOSIS — N186 End stage renal disease: Secondary | ICD-10-CM

## 2023-09-16 ENCOUNTER — Other Ambulatory Visit: Payer: Self-pay

## 2023-09-16 ENCOUNTER — Encounter (HOSPITAL_COMMUNITY): Payer: Self-pay | Admitting: Vascular Surgery

## 2023-09-16 NOTE — Pre-Procedure Instructions (Signed)
-------------    SDW INSTRUCTIONS given:  Your procedure is scheduled on 4/1.  Report to [facility] Main Entrance "A" at 07:40 A.M., and check in at the Admitting office.  Any questions or running late day of surgery: call [phone]    Remember:  Do not eat or drink after midnight the night before your surgery     Take these medicines the morning of surgery with A SIP OF WATER  allopurinol, amlodipine, Aspirin, coreg, hydralazine, imdur      May take these medicines IF NEEDED: Tylenol  As of today, STOP taking any Aleve, Naproxen, Ibuprofen, Motrin, Advil, Goody's, BC's, all herbal medications, fish oil, and all vitamins.   Do NOT Smoke (Tobacco/Vaping) 24 hours prior to your procedure  If you use a CPAP at night, you may bring all equipment for your overnight stay.     You will be asked to remove any contacts, glasses, piercing's, hearing aid's, dentures/partials prior to surgery. Please bring cases for these items if needed.     Patients discharged the day of surgery will not be allowed to drive home, and someone needs to stay with them for 24 hours.  SURGICAL WAITING ROOM VISITATION Patients may have no more than 2 support people in the waiting area - these visitors may rotate.   Pre-op nurse will coordinate an appropriate time for 1 ADULT support person, who may not rotate, to accompany patient in pre-op.  Children under the age of 24 must have an adult with them who is not the patient and must remain in the main waiting area with an adult.  If the patient needs to stay at the hospital during part of their recovery, the visitor guidelines for inpatient rooms apply.  Please refer to the [facility] website for the visitor guidelines for any additional information.   Special instructions:   [place]- Preparing For Surgery   Please follow these instructions carefully.   Shower the NIGHT BEFORE SURGERY and the MORNING OF SURGERY with DIAL Soap.   Pat yourself dry  with a CLEAN TOWEL.  Wear CLEAN PAJAMAS to bed the night before surgery  Place CLEAN SHEETS on your bed the night of your first shower and DO NOT SLEEP WITH PETS.   Additional instructions for the day of surgery: DO NOT APPLY any lotions, deodorants, cologne, or perfumes.   Do not wear jewelry or makeup Do not wear nail polish, gel polish, artificial nails, or any other type of covering on natural nails (fingers and toes) Do not bring valuables to the hospital. [facility] is not responsible for valuables/personal belongings. Put on clean/comfortable clothes.  Please brush your teeth.  Ask your nurse before applying any prescription medications to the skin.

## 2023-09-16 NOTE — Progress Notes (Signed)
 PCP - Hillery Aldo, NP Cardiologist - Dr. Zoila Shutter  PPM/ICD - denies   Chest x-ray - 08/23/23 EKG - 04/26/23 Stress Test - denies ECHO - 01/31/23 Cardiac Cath - denies  CPAP - OSA+, pt is being fitted for a CPAP on 4/14, per caregiver  Fasting Blood Sugar - 80-100 Checks Blood Sugar once/day  Blood Thinner Instructions: n/a Aspirin Instructions: continue  ERAS Protcol - no, NPO  COVID TEST- n/a  Anesthesia review: yes, O2 dependent 2L at all times, cardiac hx  Patient verbally denies any shortness of breath, fever, cough and chest pain during phone call     Questions were answered for Joanna [name], pt's caregiver. Joanna [name] verbalized understanding of instructions.

## 2023-09-16 NOTE — Addendum Note (Signed)
 Addended by: Cherene Julian B on: 09/16/2023 09:24 AM   Modules accepted: Orders

## 2023-09-17 ENCOUNTER — Ambulatory Visit (HOSPITAL_COMMUNITY)
Admission: RE | Admit: 2023-09-17 | Discharge: 2023-09-17 | Disposition: A | Discharge status: Home / Self Care | Source: Home / Self Care | Attending: Vascular Surgery | Admitting: Vascular Surgery

## 2023-09-17 ENCOUNTER — Encounter (HOSPITAL_COMMUNITY)
Admission: RE | Disposition: A | Discharge status: Home / Self Care | Payer: Self-pay | Source: Home / Self Care | Attending: Vascular Surgery

## 2023-09-17 ENCOUNTER — Emergency Department (HOSPITAL_COMMUNITY)

## 2023-09-17 ENCOUNTER — Other Ambulatory Visit: Payer: Self-pay

## 2023-09-17 ENCOUNTER — Observation Stay (HOSPITAL_COMMUNITY)
Admission: EM | Admit: 2023-09-17 | Discharge: 2023-09-18 | Disposition: A | Discharge status: Home / Self Care | Attending: Emergency Medicine | Admitting: Emergency Medicine

## 2023-09-17 ENCOUNTER — Ambulatory Visit (HOSPITAL_COMMUNITY): Admitting: Anesthesiology

## 2023-09-17 ENCOUNTER — Ambulatory Visit (HOSPITAL_BASED_OUTPATIENT_CLINIC_OR_DEPARTMENT_OTHER): Admitting: Anesthesiology

## 2023-09-17 DIAGNOSIS — G4730 Sleep apnea, unspecified: Secondary | ICD-10-CM | POA: Insufficient documentation

## 2023-09-17 DIAGNOSIS — N185 Chronic kidney disease, stage 5: Secondary | ICD-10-CM

## 2023-09-17 DIAGNOSIS — I5033 Acute on chronic diastolic (congestive) heart failure: Secondary | ICD-10-CM

## 2023-09-17 DIAGNOSIS — M109 Gout, unspecified: Secondary | ICD-10-CM | POA: Diagnosis not present

## 2023-09-17 DIAGNOSIS — I2510 Atherosclerotic heart disease of native coronary artery without angina pectoris: Secondary | ICD-10-CM | POA: Insufficient documentation

## 2023-09-17 DIAGNOSIS — Z5986 Financial insecurity: Secondary | ICD-10-CM | POA: Insufficient documentation

## 2023-09-17 DIAGNOSIS — I132 Hypertensive heart and chronic kidney disease with heart failure and with stage 5 chronic kidney disease, or end stage renal disease: Secondary | ICD-10-CM | POA: Diagnosis not present

## 2023-09-17 DIAGNOSIS — N189 Chronic kidney disease, unspecified: Secondary | ICD-10-CM | POA: Insufficient documentation

## 2023-09-17 DIAGNOSIS — E1122 Type 2 diabetes mellitus with diabetic chronic kidney disease: Secondary | ICD-10-CM | POA: Insufficient documentation

## 2023-09-17 DIAGNOSIS — Z9981 Dependence on supplemental oxygen: Secondary | ICD-10-CM | POA: Insufficient documentation

## 2023-09-17 DIAGNOSIS — Z7985 Long-term (current) use of injectable non-insulin antidiabetic drugs: Secondary | ICD-10-CM | POA: Insufficient documentation

## 2023-09-17 DIAGNOSIS — I5031 Acute diastolic (congestive) heart failure: Secondary | ICD-10-CM | POA: Insufficient documentation

## 2023-09-17 DIAGNOSIS — Z6836 Body mass index (BMI) 36.0-36.9, adult: Secondary | ICD-10-CM | POA: Insufficient documentation

## 2023-09-17 DIAGNOSIS — Z833 Family history of diabetes mellitus: Secondary | ICD-10-CM | POA: Diagnosis not present

## 2023-09-17 DIAGNOSIS — Z992 Dependence on renal dialysis: Secondary | ICD-10-CM

## 2023-09-17 DIAGNOSIS — Z8249 Family history of ischemic heart disease and other diseases of the circulatory system: Secondary | ICD-10-CM | POA: Diagnosis not present

## 2023-09-17 DIAGNOSIS — T82898A Other specified complication of vascular prosthetic devices, implants and grafts, initial encounter: Secondary | ICD-10-CM

## 2023-09-17 DIAGNOSIS — F79 Unspecified intellectual disabilities: Secondary | ICD-10-CM | POA: Insufficient documentation

## 2023-09-17 DIAGNOSIS — Z79899 Other long term (current) drug therapy: Secondary | ICD-10-CM | POA: Diagnosis not present

## 2023-09-17 DIAGNOSIS — N186 End stage renal disease: Secondary | ICD-10-CM

## 2023-09-17 DIAGNOSIS — Z7982 Long term (current) use of aspirin: Secondary | ICD-10-CM | POA: Insufficient documentation

## 2023-09-17 DIAGNOSIS — Z9181 History of falling: Secondary | ICD-10-CM | POA: Diagnosis not present

## 2023-09-17 DIAGNOSIS — I130 Hypertensive heart and chronic kidney disease with heart failure and stage 1 through stage 4 chronic kidney disease, or unspecified chronic kidney disease: Secondary | ICD-10-CM | POA: Insufficient documentation

## 2023-09-17 DIAGNOSIS — Z8673 Personal history of transient ischemic attack (TIA), and cerebral infarction without residual deficits: Secondary | ICD-10-CM | POA: Diagnosis not present

## 2023-09-17 DIAGNOSIS — E785 Hyperlipidemia, unspecified: Secondary | ICD-10-CM | POA: Diagnosis not present

## 2023-09-17 DIAGNOSIS — R569 Unspecified convulsions: Secondary | ICD-10-CM | POA: Diagnosis not present

## 2023-09-17 DIAGNOSIS — I5032 Chronic diastolic (congestive) heart failure: Secondary | ICD-10-CM | POA: Diagnosis not present

## 2023-09-17 HISTORY — DX: Dependence on supplemental oxygen: Z99.81

## 2023-09-17 HISTORY — PX: FISTULA SUPERFICIALIZATION: SHX6341

## 2023-09-17 HISTORY — DX: Sleep apnea, unspecified: G47.30

## 2023-09-17 LAB — CBC WITH DIFFERENTIAL/PLATELET
Abs Immature Granulocytes: 0.02 10*3/uL (ref 0.00–0.07)
Basophils Absolute: 0 10*3/uL (ref 0.0–0.1)
Basophils Relative: 1 %
Eosinophils Absolute: 0.1 10*3/uL (ref 0.0–0.5)
Eosinophils Relative: 1 %
HCT: 25.6 % — ABNORMAL LOW (ref 36.0–46.0)
Hemoglobin: 8 g/dL — ABNORMAL LOW (ref 12.0–15.0)
Immature Granulocytes: 0 %
Lymphocytes Relative: 7 %
Lymphs Abs: 0.3 10*3/uL — ABNORMAL LOW (ref 0.7–4.0)
MCH: 29 pg (ref 26.0–34.0)
MCHC: 31.3 g/dL (ref 30.0–36.0)
MCV: 92.8 fL (ref 80.0–100.0)
Monocytes Absolute: 0.4 10*3/uL (ref 0.1–1.0)
Monocytes Relative: 9 %
Neutro Abs: 3.7 10*3/uL (ref 1.7–7.7)
Neutrophils Relative %: 82 %
Platelets: 223 10*3/uL (ref 150–400)
RBC: 2.76 MIL/uL — ABNORMAL LOW (ref 3.87–5.11)
RDW: 17.2 % — ABNORMAL HIGH (ref 11.5–15.5)
WBC: 4.6 10*3/uL (ref 4.0–10.5)
nRBC: 0 % (ref 0.0–0.2)

## 2023-09-17 LAB — COMPREHENSIVE METABOLIC PANEL WITH GFR
ALT: 12 U/L (ref 0–44)
AST: 16 U/L (ref 15–41)
Albumin: 3.4 g/dL — ABNORMAL LOW (ref 3.5–5.0)
Alkaline Phosphatase: 34 U/L — ABNORMAL LOW (ref 38–126)
Anion gap: 11 (ref 5–15)
BUN: 98 mg/dL — ABNORMAL HIGH (ref 6–20)
CO2: 18 mmol/L — ABNORMAL LOW (ref 22–32)
Calcium: 8.9 mg/dL (ref 8.9–10.3)
Chloride: 110 mmol/L (ref 98–111)
Creatinine, Ser: 5.05 mg/dL — ABNORMAL HIGH (ref 0.44–1.00)
GFR, Estimated: 10 mL/min — ABNORMAL LOW (ref >60)
Glucose, Bld: 138 mg/dL — ABNORMAL HIGH (ref 70–99)
Potassium: 4.2 mmol/L (ref 3.5–5.1)
Sodium: 139 mmol/L (ref 135–145)
Total Bilirubin: 0.6 mg/dL (ref 0.0–1.2)
Total Protein: 6.1 g/dL — ABNORMAL LOW (ref 6.5–8.1)

## 2023-09-17 LAB — POCT I-STAT, CHEM 8
BUN: 95 mg/dL — ABNORMAL HIGH (ref 6–20)
Calcium, Ion: 1.09 mmol/L — ABNORMAL LOW (ref 1.15–1.40)
Chloride: 111 mmol/L (ref 98–111)
Creatinine, Ser: 5.7 mg/dL — ABNORMAL HIGH (ref 0.44–1.00)
Glucose, Bld: 100 mg/dL — ABNORMAL HIGH (ref 70–99)
HCT: 28 % — ABNORMAL LOW (ref 36.0–46.0)
Hemoglobin: 9.5 g/dL — ABNORMAL LOW (ref 12.0–15.0)
Potassium: 4.6 mmol/L (ref 3.5–5.1)
Sodium: 139 mmol/L (ref 135–145)
TCO2: 19 mmol/L — ABNORMAL LOW (ref 22–32)

## 2023-09-17 LAB — GLUCOSE, CAPILLARY
Glucose-Capillary: 95 mg/dL (ref 70–99)
Glucose-Capillary: 105 mg/dL — ABNORMAL HIGH (ref 70–99)

## 2023-09-17 LAB — POCT PREGNANCY, URINE: Preg Test, Ur: NEGATIVE

## 2023-09-17 LAB — SURGICAL PCR SCREEN
MRSA, PCR: POSITIVE — AB
Staphylococcus aureus: POSITIVE — AB

## 2023-09-17 LAB — MAGNESIUM: Magnesium: 2.5 mg/dL — ABNORMAL HIGH (ref 1.7–2.4)

## 2023-09-17 SURGERY — FISTULA SUPERFICIALIZATION
Anesthesia: Regional | Site: Arm Upper | Laterality: Left

## 2023-09-17 MED ORDER — SODIUM CHLORIDE 0.9% FLUSH: 3.0000 mL | Freq: Two times a day (BID) | INTRAVENOUS | Status: DC

## 2023-09-17 MED ORDER — CHLORHEXIDINE GLUCONATE 4 % EX SOLN: 60.0000 mL | Freq: Once | CUTANEOUS | Status: DC

## 2023-09-17 MED ORDER — HEPARIN 6000 UNIT IRRIGATION SOLUTION
Status: DC | PRN
  Administered 2023-09-17: 1

## 2023-09-17 MED ORDER — ALLOPURINOL 100 MG PO TABS
50.0000 mg | ORAL_TABLET | Freq: Every day | ORAL | Status: DC
  Administered 2023-09-18: 50 mg via ORAL
  Filled 2023-09-17: qty 1

## 2023-09-17 MED ORDER — TORSEMIDE 20 MG PO TABS
60.0000 mg | ORAL_TABLET | Freq: Every day | ORAL | Status: DC
  Administered 2023-09-18: 60 mg via ORAL
  Filled 2023-09-17: qty 3

## 2023-09-17 MED ORDER — CARVEDILOL 12.5 MG PO TABS
12.5000 mg | ORAL_TABLET | Freq: Two times a day (BID) | ORAL | Status: DC
  Administered 2023-09-18: 12.5 mg via ORAL
  Filled 2023-09-17: qty 1

## 2023-09-17 MED ORDER — FENTANYL CITRATE (PF) 100 MCG/2ML IJ SOLN
INTRAMUSCULAR | Status: AC
Start: 2023-09-17 — End: 2023-09-17
  Administered 2023-09-17: 50 ug via INTRAVENOUS
  Filled 2023-09-17: qty 2

## 2023-09-17 MED ORDER — ATORVASTATIN CALCIUM 40 MG PO TABS
40.0000 mg | ORAL_TABLET | Freq: Every day | ORAL | Status: DC
Start: 2023-09-18 — End: 2023-09-18
  Administered 2023-09-18: 40 mg via ORAL
  Filled 2023-09-17: qty 1

## 2023-09-17 MED ORDER — CEFAZOLIN SODIUM-DEXTROSE 2-4 GM/100ML-% IV SOLN
2.0000 g | INTRAVENOUS | Status: AC
  Administered 2023-09-17: 2 g via INTRAVENOUS
  Filled 2023-09-17: qty 100

## 2023-09-17 MED ORDER — SODIUM CHLORIDE 0.9 % IV SOLN: INTRAVENOUS | Status: DC | PRN

## 2023-09-17 MED ORDER — FENTANYL CITRATE (PF) 250 MCG/5ML IJ SOLN
INTRAMUSCULAR | Status: AC
  Filled 2023-09-17: qty 5

## 2023-09-17 MED ORDER — INSULIN ASPART 100 UNIT/ML IJ SOLN: 0.0000 [IU] | INTRAMUSCULAR | Status: DC | PRN

## 2023-09-17 MED ORDER — PROPOFOL 1000 MG/100ML IV EMUL
INTRAVENOUS | Status: AC
  Filled 2023-09-17: qty 100

## 2023-09-17 MED ORDER — SODIUM CHLORIDE 0.9% FLUSH: 3.0000 mL | INTRAVENOUS | Status: DC | PRN

## 2023-09-17 MED ORDER — OXYCODONE-ACETAMINOPHEN 5-325 MG PO TABS: 1.0000 | ORAL_TABLET | Freq: Four times a day (QID) | ORAL | 0 refills | Status: DC | PRN

## 2023-09-17 MED ORDER — ACETAMINOPHEN 500 MG PO TABS
1000.0000 mg | ORAL_TABLET | Freq: Once | ORAL | Status: AC
  Administered 2023-09-17: 1000 mg via ORAL
  Filled 2023-09-17: qty 2

## 2023-09-17 MED ORDER — MIDAZOLAM HCL 2 MG/2ML IJ SOLN
INTRAMUSCULAR | Status: AC
  Administered 2023-09-17: 2 mg via INTRAVENOUS
  Filled 2023-09-17: qty 2

## 2023-09-17 MED ORDER — ROPIVACAINE HCL 5 MG/ML IJ SOLN
INTRAMUSCULAR | Status: DC | PRN
  Administered 2023-09-17: 30 mL via PERINEURAL

## 2023-09-17 MED ORDER — HEPARIN 6000 UNIT IRRIGATION SOLUTION
Status: AC
  Filled 2023-09-17: qty 500

## 2023-09-17 MED ORDER — PROPOFOL 10 MG/ML IV BOLUS
INTRAVENOUS | Status: DC | PRN
  Administered 2023-09-17 (×2): 30 mg via INTRAVENOUS

## 2023-09-17 MED ORDER — SENNOSIDES-DOCUSATE SODIUM 8.6-50 MG PO TABS: 1.0000 | ORAL_TABLET | Freq: Every evening | ORAL | Status: DC | PRN

## 2023-09-17 MED ORDER — AMLODIPINE BESYLATE 5 MG PO TABS
10.0000 mg | ORAL_TABLET | Freq: Every day | ORAL | Status: DC
  Administered 2023-09-18: 10 mg via ORAL
  Filled 2023-09-17: qty 2

## 2023-09-17 MED ORDER — MIDAZOLAM HCL 2 MG/2ML IJ SOLN: 2.0000 mg | Freq: Once | INTRAMUSCULAR | Status: AC

## 2023-09-17 MED ORDER — FENTANYL CITRATE (PF) 100 MCG/2ML IJ SOLN: 50.0000 ug | Freq: Once | INTRAMUSCULAR | Status: AC

## 2023-09-17 MED ORDER — 0.9 % SODIUM CHLORIDE (POUR BTL) OPTIME
TOPICAL | Status: DC | PRN
  Administered 2023-09-17: 1000 mL

## 2023-09-17 MED ORDER — MIDAZOLAM HCL 2 MG/2ML IJ SOLN
INTRAMUSCULAR | Status: AC
  Filled 2023-09-17: qty 2

## 2023-09-17 MED ORDER — PROPOFOL 500 MG/50ML IV EMUL
INTRAVENOUS | Status: DC | PRN
  Administered 2023-09-17: 50 ug/kg/min via INTRAVENOUS

## 2023-09-17 MED ORDER — ACETAMINOPHEN 325 MG PO TABS: 650.0000 mg | ORAL_TABLET | Freq: Four times a day (QID) | ORAL | Status: DC | PRN

## 2023-09-17 MED ORDER — ORAL CARE MOUTH RINSE: 15.0000 mL | Freq: Once | OROMUCOSAL | Status: AC

## 2023-09-17 MED ORDER — ACETAMINOPHEN 650 MG RE SUPP: 650.0000 mg | Freq: Four times a day (QID) | RECTAL | Status: DC | PRN

## 2023-09-17 MED ORDER — CHLORHEXIDINE GLUCONATE 0.12 % MT SOLN
15.0000 mL | Freq: Once | OROMUCOSAL | Status: AC
  Administered 2023-09-17: 15 mL via OROMUCOSAL
  Filled 2023-09-17: qty 15

## 2023-09-17 MED ORDER — ISOSORBIDE MONONITRATE ER 60 MG PO TB24
120.0000 mg | ORAL_TABLET | Freq: Every day | ORAL | Status: DC
  Administered 2023-09-18: 120 mg via ORAL
  Filled 2023-09-17: qty 2

## 2023-09-17 MED ORDER — ASPIRIN 81 MG PO TBEC
81.0000 mg | DELAYED_RELEASE_TABLET | Freq: Every day | ORAL | Status: DC
  Administered 2023-09-18: 81 mg via ORAL
  Filled 2023-09-17: qty 1

## 2023-09-17 MED ORDER — HEPARIN SODIUM (PORCINE) 5000 UNIT/ML IJ SOLN
5000.0000 [IU] | Freq: Three times a day (TID) | INTRAMUSCULAR | Status: DC
  Administered 2023-09-18 (×2): 5000 [IU] via SUBCUTANEOUS
  Filled 2023-09-17 (×3): qty 1

## 2023-09-17 SURGICAL SUPPLY — 27 items
ARMBAND PINK RESTRICT EXTREMIT (MISCELLANEOUS) ×1 IMPLANT
BAG COUNTER SPONGE SURGICOUNT (BAG) ×1 IMPLANT
CANISTER SUCT 3000ML PPV (MISCELLANEOUS) ×1 IMPLANT
CLIP LIGATING EXTRA MED SLVR (CLIP) ×1 IMPLANT
CLIP LIGATING EXTRA SM BLUE (MISCELLANEOUS) ×1 IMPLANT
COVER PROBE W GEL 5X96 (DRAPES) ×1 IMPLANT
DERMABOND ADVANCED .7 DNX12 (GAUZE/BANDAGES/DRESSINGS) ×1 IMPLANT
ELECT REM PT RETURN 9FT ADLT (ELECTROSURGICAL) ×1 IMPLANT
ELECTRODE REM PT RTRN 9FT ADLT (ELECTROSURGICAL) ×1 IMPLANT
GLOVE BIO SURGEON STRL SZ7.5 (GLOVE) ×1 IMPLANT
GOWN STRL REUS W/ TWL LRG LVL3 (GOWN DISPOSABLE) ×2 IMPLANT
GOWN STRL REUS W/ TWL XL LVL3 (GOWN DISPOSABLE) ×1 IMPLANT
KIT BASIN OR (CUSTOM PROCEDURE TRAY) ×1 IMPLANT
KIT TURNOVER KIT B (KITS) ×1 IMPLANT
NS IRRIG 1000ML POUR BTL (IV SOLUTION) ×1 IMPLANT
PACK CV ACCESS (CUSTOM PROCEDURE TRAY) ×1 IMPLANT
PAD ARMBOARD POSITIONER FOAM (MISCELLANEOUS) ×2 IMPLANT
POWDER SURGICEL 3.0 GRAM (HEMOSTASIS) IMPLANT
SLING ARM FOAM STRAP LRG (SOFTGOODS) IMPLANT
SLING ARM FOAM STRAP MED (SOFTGOODS) IMPLANT
SUT MNCRL AB 4-0 PS2 18 (SUTURE) ×1 IMPLANT
SUT PROLENE 6 0 BV (SUTURE) ×1 IMPLANT
SUT SILK 2 0 PERMA HAND 18 BK (SUTURE) IMPLANT
SUT VIC AB 3-0 SH 27X BRD (SUTURE) ×1 IMPLANT
TOWEL GREEN STERILE (TOWEL DISPOSABLE) ×1 IMPLANT
UNDERPAD 30X36 HEAVY ABSORB (UNDERPADS AND DIAPERS) ×1 IMPLANT
WATER STERILE IRR 1000ML POUR (IV SOLUTION) ×1 IMPLANT

## 2023-09-17 NOTE — Op Note (Signed)
    Patient name: Joanna [name] MRN: [id number] DOB: 1971-04-23 Sex: female  09/17/2023 Pre-operative Diagnosis: Chronic kidney disease Post-operative diagnosis:  Same Surgeon:  Luanna Salk. Randie Heinz, MD. Assistant: Nathanial Rancher, PA Procedure Performed: Revision of left arm cephalic vein fistula with transposition  Indications: 53 year old female with advanced chronic kidney disease not yet on dialysis.  She had a cephalic vein fistula created which is very strong flow but is too deep for cannulation.  She is now indicated for revision with transposition versus superficialization and branch ligation.  Experience assistant was necessary to facilitate exposure of the fistula with tunneling laterally and performing of end to end anastomosis.  Findings: The fistula proximally was quite patulous and had very strong flow.  There were multiple branches divided and there was tortuosity in the fistula this was straightened and tunneled laterally and sewn into and in a completion there was a very strong thrill in the fistula confirmed with Doppler and a palpable radial artery pulse at the wrist also confirmed with Doppler.   Procedure:  The patient was identified in the holding area and taken to the operating room where she was put supine operative table and MAC anesthesia was induced.  A preoperative block had been placed.  She was gently prepped and draped in the left upper extremity usual fashion, antibiotics were administered and a timeout was called.  Ultrasound was used to identify the fistula throughout the upper arm and this was marked on the skin.  Her block was checked and noted to be intact.  2 transverse incisions were created we dissected out the entirety of the fistula from antecubitum to the axilla.  Branches were divided between ties.  The fascia was marked for orientation.  We then clamped the fistula near the antecubitum and transected this tunneled it laterally maintaining orientation and flushed with  heparinized saline.  The 2 ends were spatulated and sewn end to end with 6-0 Prolene suture.  Prior completion without flushing all directions.  Upon completion there was a very strong thrill in the fistula we did free up some of the soft tissue to allow to seat without any tension.  There was a palpable radial artery pulse at the wrist this was confirmed with Doppler.  The wounds were irrigated and hemostasis was obtained we closed in layers with Vicryl and Monocryl.  Dermabond is placed at the skin level.  The patient was awakened from anesthesia having tolerated the procedure without any complication.  All counts were correct at completion.  EBL: 50 cc  Lennette Fader C. Randie Heinz, MD [facility] [place] Office: [phone] Pager: [phone]

## 2023-09-17 NOTE — ED Notes (Signed)
 Pt disconnected from monitor for MRI. CCMD made aware.

## 2023-09-17 NOTE — Discharge Instructions (Signed)
   [facility]  Discharge Instructions  AV Fistula or Graft Surgery for Dialysis Access  Please refer to the following instructions for your post-procedure care. Your surgeon or physician assistant will discuss any changes with you.  Activity  You may drive the day following your surgery, if you are comfortable and no longer taking prescription pain medication. Resume full activity as the soreness in your incision resolves.  Bathing/Showering  You may shower after you go home. Keep your incision dry for 48 hours. Do not soak in a bathtub, hot tub, or swim until the incision heals completely. You may not shower if you have a hemodialysis catheter.  Incision Care  Clean your incision with mild soap and water after 48 hours. Pat the area dry with a clean towel. You do not need a bandage unless otherwise instructed. Do not apply any ointments or creams to your incision. You may have skin glue on your incision. Do not peel it off. It will come off on its own in about one week. Your arm may swell a bit after surgery. To reduce swelling use pillows to elevate your arm so it is above your heart. Your doctor will tell you if you need to lightly wrap your arm with an ACE bandage.  Diet  Resume your normal diet. There are not special food restrictions following this procedure. In order to heal from your surgery, it is CRITICAL to get adequate nutrition. Your body requires vitamins, minerals, and protein. Vegetables are the best source of vitamins and minerals. Vegetables also provide the perfect balance of protein. Processed food has little nutritional value, so try to avoid this.  Medications  Resume taking all of your medications. If your incision is causing pain, you may take over-the counter pain relievers such as acetaminophen (Tylenol). If you were prescribed a stronger pain medication, please be aware these medications can cause nausea and constipation. Prevent  nausea by taking the medication with a snack or meal. Avoid constipation by drinking plenty of fluids and eating foods with high amount of fiber, such as fruits, vegetables, and grains.  Do not take Tylenol if you are taking prescription pain medications.  Follow up Your surgeon may want to see you in the office following your access surgery. If so, this will be arranged at the time of your surgery.  Please call us immediately for any of the following conditions:  Increased pain, redness, drainage (pus) from your incision site Fever of 101 degrees or higher Severe or worsening pain at your incision site Hand pain or numbness.  Reduce your risk of vascular disease:  Stop smoking. If you would like help, call [facility] at 1-800-QUIT-NOW (516-643-0212) or [place] at [phone]  Manage your cholesterol Maintain a desired weight Control your diabetes Keep your blood pressure down  Dialysis  It will take several weeks to several months for your new dialysis access to be ready for use. Your surgeon will determine when it is okay to use it. Your nephrologist will continue to direct your dialysis. You can continue to use your Permcath until your new access is ready for use.   09/17/2023 Joanna [name] [id number] 30-Jan-1971  Surgeon(s): Maeola Harman, MD  Procedure(s): FISTULA SUPERFICIALIZATION   May stick graft immediately   May stick graft on designated area only:   X Do not stick left AV fistula for 6 weeks    If you have any questions, please call the office at [phone].

## 2023-09-17 NOTE — Progress Notes (Signed)
 Patient headed to MRI per RN. EEG to be placed when schedule permits.

## 2023-09-17 NOTE — Hospital Course (Signed)
 Concerns for seizure  Patient presented after a seizure like activity at the grocery store. On arrival, there were no  seizure like activity and her EEG was unrevealing.  Neurology was consulted to see patient who recommended initiating 500 mg keppra daily given that the patient is not a candidate of immediate dialysis per nephrology. He fistula site is clean, dry and intact . Patient is discharged home in stable condition.

## 2023-09-17 NOTE — Progress Notes (Signed)
    Ms. Joanna [name] was evaluated in the emergency department now being admitted with concern for seizure.  Her left upper extremity fistula has a very strong thrill there is a palpable radial artery pulse at the wrist and her sensation and motor function is resuming after left upper extremity block.  Vascular surgery will be available as needed while patient is admitted.  Lemar Livings, MD

## 2023-09-17 NOTE — Anesthesia Procedure Notes (Signed)
 Anesthesia Regional Block: Supraclavicular block   Pre-Anesthetic Checklist: , timeout performed,  Correct Patient, Correct Site, Correct Laterality,  Correct Procedure, Correct Position, site marked,  Risks and benefits discussed,  Surgical consent,  Pre-op evaluation,  At surgeon's request and post-op pain management  Laterality: Left  Prep: chloraprep       Needles:  Injection technique: Single-shot  Needle Type: Echogenic Needle     Needle Length: 9cm  Needle Gauge: 21     Additional Needles:   Procedures:,,,, ultrasound used (permanent image in chart),,    Narrative:  Start time: 09/17/2023 9:05 AM End time: 09/17/2023 9:12 AM Injection made incrementally with aspirations every 5 mL.  Performed by: Personally  Anesthesiologist: Collene Schlichter, MD  Additional Notes: No pain on injection. No increased resistance to injection. Injection made in 5cc increments.  Good needle visualization.  Patient tolerated procedure well.

## 2023-09-17 NOTE — Consult Note (Signed)
 NEUROLOGY CONSULT NOTE   Date of service: September 17, 2023 Patient Name: Joanna [name] MRN:  [id number] DOB:  1970-11-04 Chief Complaint: "Seizures" Requesting Provider: Melene Plan, DO  History of Present Illness  Joanna [name] is a 53 y.o. female with hx of diastolic heart failure, hypertension, intellectual disability, morbid obesity, sleep apnea, type 2 diabetes, ESRD with fistula placed after which she had a seizure and then had another seizure while at [facility]. Says she was feeling somewhat out of it after her procedure, felt as if she is not able to move her arms and legs and then had shaking of the whole body.  This was noticed by the staff in the OR but she was discharged home and while she was shopping over at [facility], family noted 2 more episodes of seizures-whole body shaking. No prior history of seizures.  No clear aura.  Said felt weaker on the left side-some limitation due to pain but also said it felt weaker than the pain.    ROS  Comprehensive ROS performed and pertinent positives documented in HPI    Past History   Past Medical History:  Diagnosis Date   Acute diastolic heart failure (HCC) 12/16/2019   Acute respiratory failure with hypoxia (HCC) 12/16/2019   AKI (acute kidney injury) (HCC) 12/16/2019   Anasarca 12/16/2019   Anemia    CAD (coronary artery disease)    Depression    Diabetes mellitus without complication (HCC)    HTN (hypertension) 12/16/2019   Hypertension    Hypertensive urgency 12/16/2019   Intellectual disability 12/16/2019   Morbid obesity (HCC)    New onset of congestive heart failure (HCC) 12/16/2019   Obesity, Class III, BMI 40-49.9 (morbid obesity) (HCC) 12/16/2019   Oxygen dependent    2L at all times   Sleep apnea    getting fitted for CPAP 4/14   Type 2 diabetes mellitus without complication (HCC) 12/16/2019    Past Surgical History:  Procedure Laterality Date   AV FISTULA PLACEMENT Left 07/23/2023   Procedure: LEFT ARM BRACHIOCEPHALIC  ARTERIOVENOUS (AV) FISTULA CREATION;  Surgeon: Daria Pastures, MD;  Location: [facility] OR;  Service: Vascular;  Laterality: Left;   CATARACT EXTRACTION Bilateral     Family History: Family History  Problem Relation Age of Onset   Heart disease Mother        Enlarged heart per patient   Hypertension Mother    Diabetes Mother    Hypertension Sister    Diabetes Sister     Social History  reports that she has never smoked. She has never used smokeless tobacco. She reports that she does not currently use alcohol. She reports that she does not use drugs.  No Known Allergies  Medications  No current facility-administered medications for this encounter.  Current Outpatient Medications:    acetaminophen (TYLENOL) 325 MG tablet, Take 650 mg by mouth every 6 (six) hours as needed for moderate pain (pain score 4-6)., Disp: , Rfl:    allopurinol (ZYLOPRIM) 100 MG tablet, Take 50 mg by mouth in the morning., Disp: , Rfl:    amLODipine (NORVASC) 10 MG tablet, Take 1 tablet (10 mg total) by mouth daily., Disp: 30 tablet, Rfl: 0   aspirin EC 81 MG EC tablet, Take 1 tablet (81 mg total) by mouth daily. Swallow whole., Disp: 30 tablet, Rfl: 11   atorvastatin (LIPITOR) 40 MG tablet, Take 1 tablet (40 mg total) by mouth daily. (Patient taking differently: Take 40 mg by mouth every evening.), Disp:  30 tablet, Rfl: 0   calcitRIOL (ROCALTROL) 0.5 MCG capsule, Take 0.5 mcg by mouth in the morning., Disp: , Rfl:    carvedilol (COREG) 12.5 MG tablet, Take 1 tablet (12.5 mg total) by mouth 2 (two) times daily with a meal., Disp: 60 tablet, Rfl: 0   hydrALAZINE (APRESOLINE) 100 MG tablet, Take 100 mg by mouth 2 (two) times daily., Disp: , Rfl:    isosorbide mononitrate (IMDUR) 120 MG 24 hr tablet, Take 1 tablet (120 mg total) by mouth daily., Disp: 30 tablet, Rfl: 0   MOUNJARO 2.5 MG/0.5ML Pen, Inject 2.5 mg into the skin every Tuesday., Disp: , Rfl:    oxyCODONE-acetaminophen (PERCOCET) 5-325 MG tablet, Take 1  tablet by mouth every 6 (six) hours as needed for severe pain (pain score 7-10)., Disp: 20 tablet, Rfl: 0   torsemide (DEMADEX) 20 MG tablet, Take 60 mg by mouth in the morning., Disp: , Rfl:   Vitals   Vitals:   10/16/23 1715 10/16/2023 1730 10/16/2023 1900 Oct 16, 2023 2030  BP: 132/71 (!) 113/93 134/74 127/64  Pulse: 70 76 74 74  Resp: 15 20 20 19   Temp:  97.8 F (36.6 C)    TempSrc:      SpO2: 100% 100% 100% 97%    There is no height or weight on file to calculate BMI.  Physical Exam  General: Awake alert in no distress HEENT: Normocephalic atraumatic Lungs: Clear Cardiovascular: Regular rhythm Neurological exam She is awake alert oriented x 3 Speech is somewhat tangential but there is no evidence of dysarthria or aphasia. Cranial nerves II to XII intact Motor examination with no drift although left upper extremity examination is somewhat limited by pain. Sensation intact light touch all over Coordination examination reveals no dysmetria   Labs/Imaging/Neurodiagnostic studies   CBC:  Recent Labs  Lab 10/16/2023 0834 2023/10/16 1535  WBC  --  4.6  NEUTROABS  --  3.7  HGB 9.5* 8.0*  HCT 28.0* 25.6*  MCV  --  92.8  PLT  --  223   Basic Metabolic Panel:  Lab Results  Component Value Date   NA 139 10/16/2023   K 4.2 2023-10-16   CO2 18 (L) 2023/10/16   GLUCOSE 138 (H) 16-Oct-2023   BUN 98 (H) 10/16/23   CREATININE 5.05 (H) 2023-10-16   CALCIUM 8.9 2023-10-16   GFRNONAA 10 (L) 16-Oct-2023   GFRAA 39 (L) 01/18/2020   HgbA1c:  Lab Results  Component Value Date   HGBA1C 6.0 (H) 01/29/2023   Urine Drug Screen: No results found for: "LABOPIA", "COCAINSCRNUR", "LABBENZ", "AMPHETMU", "THCU", "LABBARB"  Alcohol Level No results found for: "ETH" INR  Lab Results  Component Value Date   INR 1.3 (H) 04/27/2023   APTT  Lab Results  Component Value Date   APTT 36 04/27/2023   AED levels: No results found for: "PHENYTOIN", "ZONISAMIDE", "LAMOTRIGINE",  "LEVETIRACETA"  CT Head without contrast(Personally reviewed): No acute intracranial process  ASSESSMENT   Joanna [name] is a 53 y.o. female with above past medical history who recently had a AV fistula placed for dialysis, with multiple episodes of full body shaking concerning for seizures.  She does not remember any of these episodes and the witnessed people describe this as seizures. Given her history of ESRD, metabolic derangements could have lowered seizure threshold-she also has a history of intellectual disability. Given this is a cluster of seizures all together-I would still consider this as 1 episode and would wait to start ASD unless the EEG  or MRI show any abnormality or if the seizures recur.   RECOMMENDATIONS  Admit to hospitalist Frequent neurochecks Telemetry No antiepileptic medications for now Maintain seizure precautions Routine EEG MRI brain without contrast If the EEG or MRI show any abnormality or if she has recurrent seizure, we would recommend starting an antiseizure medication.  If the EEG and MRI are normal and there is no recurrent seizures, we will follow the tests and sign off at that time. Plan was relayed to the admitting team resident MDs.  ______________________________________________________________________    Signed, Milon Dikes, MD [facility]

## 2023-09-17 NOTE — H&P (Incomplete)
 Date: 09/17/2023               Patient Name:  Joanna [name] MRN: [id number]  DOB: 05/29/1971 Age / Sex: 53 y.o., female   PCP: Joanna Aldo, NP         Medical Service: Internal Medicine Teaching Service         Attending Physician: Dr. Ginnie Smart, MD      First Contact: Joanna Lime, MD  Pager:  [phone]  Second Contact: Joanna Mood, MD  Pager:  [phone]       After Hours  (After 5pm / First Contact Pager: [phone]  weekends / holidays): Second Contact Pager: [phone]   SUBJECTIVE   Chief Complaint: seizure like activity   History of Present Illness: Joanna [name] is a 53 y.o. female with PMH of ESRD Stage V, T2DM, right MCA ischemic stroke, intellectual disability, coming in with complaints of seizure like activity. She reports that she was in the car after being at [employer] with her sister. She reported feeling hungry although not feeling cold, clammy, or dizzy prior to the event and then having generalized body shaking. No tongue biting or incontinence during this time. No falls. Regained consciousness and was then groggy. She is fully alert and oriented x3 now.  She had a revision of LUE AVF this morning with [facility]. Reports stress/anxiety for her recent procedure. She had endorsed left arm numbness/heaviness and tingling post procedure. Did have nerve block. Able to move LUE but limited due to pain after the procedure. Reports hx of depression, denies any SI/HI. On chart review, she is getting a fistula as her ESRD is progressing.   Collateral obtained from sister (Joanna [name]). Joanna [name] was in the car and she wanted to go to the bathroom. She was talking and suddenly started shaking. Sister tried to get her attention and she would not talk to her. Had two episodes that lasted  2 minutes each with a 15 second lapse in between. Legs were kicking and arms were shaking.  Was not conscious when she had the shaking. Her upper body slid out of the seat. No incontinence after. Did have an  episode of stool incontinence yesterday which has never happened before. She remembered going to the bathroom but did not remember anything after that. Slowly she was able to remember things after. No tongue biting.  No seizures in the past, first time this happens. No family history of seizures. No fevers, chills, sick contacts. Has had headaches for past few days. No falls or head injury. She does use O2 at home (2L) 24/7 due to her CHF, and is getting fitted for a CPAP machine. She still produces urine.   ED Course: Vitals were 113/93, NSR, RR 20 saturating at 100%.  Labs significant for negative UPR, Glucose of 95, WBC 4.6, Hgb 8.0 (around baseline ~8-9), Sodium 139, K 4.2, Cr 5.05, BUN 98 Imaging: CT head unremarkable, chronic ischemic changes.EKG NSR, Prolonged Qtc ( )  Consulted Neurology - recommended MRI and EEG  Meds:   Current Meds  Medication Sig  . allopurinol (ZYLOPRIM) 100 MG tablet Take 50 mg by mouth in the morning.  Marland Kitchen amLODipine (NORVASC) 10 MG tablet Take 1 tablet (10 mg total) by mouth daily.  Marland Kitchen aspirin EC 81 MG EC tablet Take 1 tablet (81 mg total) by mouth daily. Swallow whole.  Marland Kitchen atorvastatin (LIPITOR) 40 MG tablet Take 1 tablet (40 mg total) by mouth daily. (Patient taking differently: Take 40 mg by mouth  every evening.)  . calcitRIOL (ROCALTROL) 0.5 MCG capsule Take 0.5 mcg by mouth in the morning.  . carvedilol (COREG) 12.5 MG tablet Take 1 tablet (12.5 mg total) by mouth 2 (two) times daily with a meal.  . hydrALAZINE (APRESOLINE) 100 MG tablet Take 100 mg by mouth 2 (two) times daily.  . isosorbide mononitrate (IMDUR) 120 MG 24 hr tablet Take 1 tablet (120 mg total) by mouth daily.  Marland Kitchen torsemide (DEMADEX) 20 MG tablet Take 60 mg by mouth in the morning.    Past Medical History Past Medical History:  Diagnosis Date  . Acute diastolic heart failure (HCC) 12/16/2019  . Acute respiratory failure with hypoxia (HCC) 12/16/2019  . AKI (acute kidney injury) (HCC)  12/16/2019  . Anasarca 12/16/2019  . Anemia   . CAD (coronary artery disease)   . Depression   . Diabetes mellitus without complication (HCC)   . HTN (hypertension) 12/16/2019  . Hypertension   . Hypertensive urgency 12/16/2019  . Intellectual disability 12/16/2019  . Morbid obesity (HCC)   . New onset of congestive heart failure (HCC) 12/16/2019  . Obesity, Class III, BMI 40-49.9 (morbid obesity) (HCC) 12/16/2019  . Oxygen dependent    2L at all times  . Sleep apnea    getting fitted for CPAP 4/14  . Type 2 diabetes mellitus without complication (HCC) 12/16/2019    Past Surgical History Past Surgical History:  Procedure Laterality Date  . AV FISTULA PLACEMENT Left 07/23/2023   Procedure: LEFT ARM BRACHIOCEPHALIC ARTERIOVENOUS (AV) FISTULA CREATION;  Surgeon: Daria Pastures, MD;  Location: [facility] OR;  Service: Vascular;  Laterality: Left;  . CATARACT EXTRACTION Bilateral    Social:  Lives With: sister  Support: family Level of Function: independent with ADLs  PCP: Joanna Aldo, NP Substances: -Tobacco: denies  -Alcohol: denies -Recreational Drug: denies  Family History:  Family History  Problem Relation Age of Onset  . Heart disease Mother        Enlarged heart per patient  . Hypertension Mother   . Diabetes Mother   . Hypertension Sister   . Diabetes Sister    Allergies: Allergies as of 09/17/2023  . (No Known Allergies)    Review of Systems: A complete ROS was negative except as per HPI.   OBJECTIVE:   Physical Exam: Blood pressure (!) 113/93, pulse 76, temperature 97.8 F (36.6 C), resp. rate 20, last menstrual period 09/06/2023, SpO2 100%.  Constitutional: well-appearing, in NAD, resting comfortably in bed  HENT: normocephalic atraumatic, mucous membranes moist Eyes: conjunctiva non-erythematous Neck: supple Cardiovascular: regular rate and rhythm, no m/r/g Pulmonary/Chest: normal work of breathing on room air, lungs clear to auscultation  bilaterally Abdominal: soft, non-tender, non-distended MSK: normal bulk and tone Neurological: alert & oriented x 3, 5/5 strength in bilateral upper and lower extremities, No cranial nerve deficits, sensation intact.  Skin: warm and dry, erythema over incision from this morning covered in dermabond. Painful to palpation, palpable thrill. Radial pulses present bilaterally.     Psych: anxious   Labs: CBC    Component Value Date/Time   WBC 4.6 09/17/2023 1535   RBC 2.76 (L) 09/17/2023 1535   HGB 8.0 (L) 09/17/2023 1535   HCT 25.6 (L) 09/17/2023 1535   PLT 223 09/17/2023 1535   MCV 92.8 09/17/2023 1535   MCH 29.0 09/17/2023 1535   MCHC 31.3 09/17/2023 1535   RDW 17.2 (H) 09/17/2023 1535   LYMPHSABS 0.3 (L) 09/17/2023 1535   MONOABS 0.4 09/17/2023 1535  EOSABS 0.1 09/17/2023 1535   BASOSABS 0.0 09/17/2023 1535     CMP     Component Value Date/Time   NA 139 09/17/2023 1535   NA 140 01/18/2020 0951   K 4.2 09/17/2023 1535   CL 110 09/17/2023 1535   CO2 18 (L) 09/17/2023 1535   GLUCOSE 138 (H) 09/17/2023 1535   BUN 98 (H) 09/17/2023 1535   BUN 25 (H) 01/18/2020 0951   CREATININE 5.05 (H) 09/17/2023 1535   CALCIUM 8.9 09/17/2023 1535   PROT 6.1 (L) 09/17/2023 1535   ALBUMIN 3.4 (L) 09/17/2023 1535   AST 16 09/17/2023 1535   ALT 12 09/17/2023 1535   ALKPHOS 34 (L) 09/17/2023 1535   BILITOT 0.6 09/17/2023 1535   GFRNONAA 10 (L) 09/17/2023 1535   GFRAA 39 (L) 01/18/2020 0951    Imaging:  CT Head Wo Contrast CLINICAL DATA:  Seizure  EXAM: CT HEAD WITHOUT CONTRAST  TECHNIQUE: Contiguous axial images were obtained from the base of the skull through the vertex without intravenous contrast.  RADIATION DOSE REDUCTION: This exam was performed according to the departmental dose-optimization program which includes automated exposure control, adjustment of the mA and/or kV according to patient size and/or use of iterative reconstruction technique.  COMPARISON:  MRI  brain 01/30/2023.  CT head 01/30/2023.  FINDINGS: Brain: No evidence of acute infarction, hemorrhage, hydrocephalus, extra-axial collection or mass lesion/mass effect. There stable mild periventricular white matter hypodensity compatible with chronic small vessel ischemic change.  Vascular: Atherosclerotic calcifications are present within the cavernous internal carotid arteries.  Skull: Normal. Negative for fracture or focal lesion.  Sinuses/Orbits: No acute finding.  Other: None.  IMPRESSION: 1. No acute intracranial process. 2. Stable mild chronic small vessel ischemic change.  Electronically Signed   By: Darliss Cheney M.D.   On: 09/17/2023 18:38   EKG: NSR, prolonged Qtc.  ASSESSMENT & PLAN:   Assessment & Plan by Problem: Principal Problem:   Seizure-like activity (HCC)  Armoni [name] is a 53 y.o. female with a PMH of ESRD Stage V, T2DM, intellectual disability, right MCA ischemic stroke,  recent AV fistula revision this AM presenting with seizure like activity admitted for further work-up.   Seizure like activity  Described as generalized shaking of the arms and kicking of the legs that last approximately 2 min. She had two episodes with her sister, with about 15 sec breaks in between. On history, Joanna [name] told us that she was conscious during the events, has no BM or tongue biting, but sister told us that she became unresponsive during these episodes. Joanna [name] also had an unusual episode of bowel incontinence yesterday that had never happened before.  She also had a witnessed seizure with EMS. She was confused and groggy after but is currently not post-ictal. Has no prior history of seizures and no family history of seizures. However she does have structural abnormalities secondary to her prior CVA as well as a progressing ESRD that could increase her risk of seizures. Bun is elevated at 98 although she seems to have been around this level since November last year. She did have  the AV fistula revision earlier today, which has caused significant stress for her. Appreciate Neurology, MRI and EEG are pending. Nephro will need to be contacted tomorrow in case her elevated BUN may be causing her seizures and should there be a need for HD. She currently does not have access for HD. Currently, she is stable, AAO x 3, with no neurological deficits.  Her sensation on both arms are intact and has palpable radial pulses bilaterally. Hgb is stable, and less likely due to blood loss. CT head unremarkable. Doubt that this is infectious as her WBC is stable and currently afebrile. However, she did have erythema over incision as well as warmth but she did get the procedure done earlier this morning and could possibly be due to recent incision vs infection. Area of erythema has been marked to watch for progression. Vascular did evaluate her earlier with no concerns over incision. She does have a history of T2DM, and reported being very hungry after her procedure at [facility]. She denied feeling dizzy or clammy. Hgb A1c was 6.0 Glucose was 95, unlikely that it was due to hypoglycemia.   -Appreciate Neurology and Vascular -MRI brain  -EEG pending -Consult Nephro tomorrow  -frequent Neuro checks  -monitor incision, trend fever curve, consider blood cultures if WBC increases or develops a fever    CKD V, progression to ESRD not on dialysis  LUE AVF    -trend renal function  -consider nephrology consult in AM  HFpEF On Torsemide 60mg  every morning. Producing some urine still. Mildly volume overloaded currently, with mild pitting edema but no crackles. Last Echo was done on 01/2023 and    HTN ***  Hx of CVA HLD Right MCA ischemic stroke on 01/2023 during admission for hypertensive urgency.  -continue home Liipitor 40 mg and ASA 81 mg daily   *** ***  *** ***   Diet: renal VTE: {NAMES:3044014::"Heparin","Enoxaparin","SCDs","DOAC","None"} IVF: {NAMES:3044014::"None","NS","1/2  NS","LR","D5","D10"},{NAMES:3044014::"None","10cc/hr","25cc/hr","50cc/hr","75cc/hr","100cc/hr","110cc/hr","125cc/hr","Bolus"} Code: {NAMES:3044014::"Full","DNR","DNI","DNR/DNI","Comfort Care","Unknown"} Surrogate Decision Maker: ***  Prior to Admission Living Arrangement: {NAMES:3044014::"Home, living ***","SNF, ***","Homeless","***"} Anticipated Discharge Location: {NAMES:3044014::"Home","SNF","CIR","***"} Barriers to Discharge: ***  Dispo: Admit patient to Observation with expected length of stay less than 2 midnights.  Signed: Manuela Neptune, MD Internal Medicine Resident PGY-1 09/17/2023, 11:14 PM   Please contact IM Residency On-Call Pager at: [phone] or [phone].

## 2023-09-17 NOTE — Anesthesia Preprocedure Evaluation (Addendum)
 Anesthesia Evaluation  Patient identified by MRN, date of birth, ID band Patient awake    Reviewed: Allergy & Precautions, NPO status , Patient's Chart, lab work & pertinent test results, reviewed documented beta blocker date and time   Airway Mallampati: II  TM Distance: >3 FB Neck ROM: Full    Dental  (+) Teeth Intact, Dental Advisory Given   Pulmonary sleep apnea (2L O2 [place]), Continuous Positive Airway Pressure Ventilation and Oxygen sleep apnea    Pulmonary exam normal breath sounds clear to auscultation       Cardiovascular hypertension, Pt. on medications and Pt. on home beta blockers + CAD and +CHF  Normal cardiovascular exam Rhythm:Regular Rate:Normal     Neuro/Psych  PSYCHIATRIC DISORDERS  Depression    negative neurological ROS     GI/Hepatic negative GI ROS, Neg liver ROS,,,  Endo/Other  diabetes, Type 2  Obesity   Renal/GU ESRFRenal disease     Musculoskeletal negative musculoskeletal ROS (+)    Abdominal   Peds  Hematology  (+) Blood dyscrasia, anemia   Anesthesia Other Findings Day of surgery medications reviewed with the patient.  Reproductive/Obstetrics                             Anesthesia Physical Anesthesia Plan  ASA: 3  Anesthesia Plan: Regional   Post-op Pain Management: Tylenol PO (pre-op)*   Induction: Intravenous  PONV Risk Score and Plan: 2 and TIVA, Midazolam, Dexamethasone and Ondansetron  Airway Management Planned: Natural Airway and Simple Face Mask  Additional Equipment:   Intra-op Plan:   Post-operative Plan:   Informed Consent: I have reviewed the patients History and Physical, chart, labs and discussed the procedure including the risks, benefits and alternatives for the proposed anesthesia with the patient or authorized representative who has indicated his/her understanding and acceptance.     Dental advisory given  Plan Discussed with:  CRNA  Anesthesia Plan Comments:        Anesthesia Quick Evaluation

## 2023-09-17 NOTE — ED Provider Notes (Signed)
 [place] EMERGENCY DEPARTMENT AT [facility] Provider Note   CSN: [id number] Arrival date & time: 09/17/23  1400     History  Chief Complaint  Patient presents with   Seizures    Joanna [name] is a 53 y.o. female.  53 yo F with a cc of an episode of loss of consciousness.  She had just had her fistula placed today for dialysis in her left arm.  She said that after the procedure she actually had an event where she thinks that she had a seizure she was evaluated and she was doing better and went home.  She then had another event.  She said has been having issues because she feels like she cannot feel with her left arm after the procedure.  She felt like both of her arms got numb at 1 point she felt a bit panicked and then woke up later.  She said she was confused about how it happened.  She denies headache denies neck pain denies chest pain denies difficulty breathing.   Seizures      Home Medications Prior to Admission medications   Medication Sig Start Date End Date Taking? Authorizing Provider  acetaminophen (TYLENOL) 325 MG tablet Take 650 mg by mouth every 6 (six) hours as needed for moderate pain (pain score 4-6).    [provider]  allopurinol (ZYLOPRIM) 100 MG tablet Take 50 mg by mouth in the morning. 06/07/23   [provider]  amLODipine (NORVASC) 10 MG tablet Take 1 tablet (10 mg total) by mouth daily. 02/05/23 04/25/24  Dorcas Carrow, MD  aspirin EC 81 MG EC tablet Take 1 tablet (81 mg total) by mouth daily. Swallow whole. 12/23/19   Jerald Kief, MD  atorvastatin (LIPITOR) 40 MG tablet Take 1 tablet (40 mg total) by mouth daily. Patient taking differently: Take 40 mg by mouth every evening. 02/05/23 04/25/24  Dorcas Carrow, MD  calcitRIOL (ROCALTROL) 0.5 MCG capsule Take 0.5 mcg by mouth in the morning. 07/08/23   [provider]  carvedilol (COREG) 12.5 MG tablet Take 1 tablet (12.5 mg total) by mouth 2 (two) times daily with a meal.  02/04/23 04/25/24  Dorcas Carrow, MD  hydrALAZINE (APRESOLINE) 100 MG tablet Take 100 mg by mouth 2 (two) times daily.    [provider]  isosorbide mononitrate (IMDUR) 120 MG 24 hr tablet Take 1 tablet (120 mg total) by mouth daily. 12/23/19 04/25/24  Jerald Kief, MD  MOUNJARO 2.5 MG/0.5ML Pen Inject 2.5 mg into the skin every Tuesday. 08/13/23   [provider]  oxyCODONE-acetaminophen (PERCOCET) 5-325 MG tablet Take 1 tablet by mouth every 6 (six) hours as needed for severe pain (pain score 7-10). 09/17/23 09/16/24  Baglia, Corrina, PA-C  torsemide (DEMADEX) 20 MG tablet Take 60 mg by mouth in the morning.    [provider]      Allergies    Patient has no known allergies.    Review of Systems   Review of Systems  Neurological:  Positive for seizures.    Physical Exam Updated Vital Signs BP (!) 113/93   Pulse 76   Temp 97.8 F (36.6 C)   Resp 20   LMP 09/06/2023 (Approximate)   SpO2 100%  Physical Exam Vitals and nursing note reviewed.  Constitutional:      General: She is not in acute distress.    Appearance: She is well-developed. She is not diaphoretic.  HENT:     Head: Normocephalic and  atraumatic.  Eyes:     Pupils: Pupils are equal, round, and reactive to light.  Cardiovascular:     Rate and Rhythm: Normal rate and regular rhythm.     Heart sounds: No murmur heard.    No friction rub. No gallop.  Pulmonary:     Effort: Pulmonary effort is normal.     Breath sounds: No wheezing or rales.  Abdominal:     General: There is no distension.     Palpations: Abdomen is soft.     Tenderness: There is no abdominal tenderness.  Musculoskeletal:        General: No tenderness.     Cervical back: Normal range of motion and neck supple.     Comments: Incision to the left upper arm.  Dermabond in place.  No obvious drainage.  Skin:    General: Skin is warm and dry.  Neurological:     Mental Status: She is alert and oriented to person, place, and  time.  Psychiatric:        Behavior: Behavior normal.     ED Results / Procedures / Treatments   Labs (all labs ordered are listed, but only abnormal results are displayed) Labs Reviewed  CBC WITH DIFFERENTIAL/PLATELET - Abnormal; Notable for the following components:      Result Value   RBC 2.76 (*)    Hemoglobin 8.0 (*)    HCT 25.6 (*)    RDW 17.2 (*)    Lymphs Abs 0.3 (*)    All other components within normal limits  COMPREHENSIVE METABOLIC PANEL WITH GFR - Abnormal; Notable for the following components:   CO2 18 (*)    Glucose, Bld 138 (*)    BUN 98 (*)    Creatinine, Ser 5.05 (*)    Total Protein 6.1 (*)    Albumin 3.4 (*)    Alkaline Phosphatase 34 (*)    GFR, Estimated 10 (*)    All other components within normal limits  MAGNESIUM - Abnormal; Notable for the following components:   Magnesium 2.5 (*)    All other components within normal limits    EKG EKG Interpretation Date/Time:  Tuesday September 17 2023 14:27:12 EDT Ventricular Rate:  70 PR Interval:  231 QRS Duration:  107 QT Interval:  456 QTC Calculation: 493 R Axis:   51  Text Interpretation: Sinus rhythm Prolonged PR interval Probable left atrial enlargement Nonspecific T abnormalities, lateral leads Borderline prolonged QT interval Otherwise no significant change Confirmed by Melene Plan [phone]) on 09/17/2023 3:33:22 PM  Radiology CT Head Wo Contrast Result Date: 09/17/2023 CLINICAL DATA:  Seizure EXAM: CT HEAD WITHOUT CONTRAST TECHNIQUE: Contiguous axial images were obtained from the base of the skull through the vertex without intravenous contrast. RADIATION DOSE REDUCTION: This exam was performed according to the departmental dose-optimization program which includes automated exposure control, adjustment of the mA and/or kV according to patient size and/or use of iterative reconstruction technique. COMPARISON:  MRI brain 01/30/2023.  CT head 01/30/2023. FINDINGS: Brain: No evidence of acute infarction,  hemorrhage, hydrocephalus, extra-axial collection or mass lesion/mass effect. There stable mild periventricular white matter hypodensity compatible with chronic small vessel ischemic change. Vascular: Atherosclerotic calcifications are present within the cavernous internal carotid arteries. Skull: Normal. Negative for fracture or focal lesion. Sinuses/Orbits: No acute finding. Other: None. IMPRESSION: 1. No acute intracranial process. 2. Stable mild chronic small vessel ischemic change. Electronically Signed   By: Darliss Cheney M.D.   On: 09/17/2023 18:38  Procedures Procedures    Medications Ordered in ED Medications - No data to display  ED Course/ Medical Decision Making/ A&P                                 Medical Decision Making Amount and/or Complexity of Data Reviewed Labs: ordered. Radiology: ordered.   53 yo F with a chief complaints of a event where she lost consciousness.  Patient just had a fistula placed today.  She said after the procedure she thinks she had an event where she had a seizure and then ended up having reportedly to do more while at [facility] and then had an episode with EMS.  She now feels okay but is hungry.  She said before the procedure she was doing okay.  Patient's blood work is resulted with out significant finding.  Her hemoglobin is about a gram less then from earlier today.  No significant electrolyte abnormalities.  I did discuss the case with Dr. Randie Heinz, vascular surgery.  He did not think that the procedure would cause the patient to lose consciousness or have a seizure.  He did comment that sometimes the nerve block can cause some phrenic nerve involvement.  I discussed case with Dr. Wilford Corner neurology.  With multiple possible seizure events he recommended an MRI and an EEG.  Discussed with medicine for admission.  The patients results and plan were reviewed and discussed.   Any x-rays performed were independently reviewed by myself.   Differential  diagnosis were considered with the presenting HPI.  Medications - No data to display  Vitals:   09/17/23 1430 09/17/23 1645 09/17/23 1715 09/17/23 1730  BP: 120/72 130/75 132/71 (!) 113/93  Pulse: 73 67 70 76  Resp: 16 16 15 20   Temp: (!) 97.5 F (36.4 C)   97.8 F (36.6 C)  TempSrc: Oral     SpO2: 100% 100% 100% 100%    Final diagnoses:  Seizure-like activity (HCC)    Admission/ observation were discussed with the admitting physician, patient and/or family and they are comfortable with the plan.          Final Clinical Impression(s) / ED Diagnoses Final diagnoses:  Seizure-like activity [facility])    Rx / DC Orders ED Discharge Orders     None         Melene Plan, DO 09/17/23 1958

## 2023-09-17 NOTE — Interval H&P Note (Signed)
 History and Physical Interval Note:  09/17/2023 8:07 AM  Joanna [name]  has presented today for surgery, with the diagnosis of ESRD.  The various methods of treatment have been discussed with the patient and family. After consideration of risks, benefits and other options for treatment, the patient has consented to  Procedure(s): FISTULA SUPERFICIALIZATION (Left) as a surgical intervention.  The patient's history has been reviewed, patient examined, no change in status, stable for surgery.  I have reviewed the patient's chart and labs.  Questions were answered to the patient's satisfaction.     Lemar Livings

## 2023-09-17 NOTE — H&P (Incomplete)
 Date: 09/17/2023               Patient Name:  Joanna [name] MRN: [id number]  DOB: September 13, 1970 Age / Sex: 53 y.o., female   PCP: Hillery Aldo, NP         Medical Service: Internal Medicine Teaching Service         Attending Physician: Dr. Ginnie Smart, MD      First Contact: Kathleen Lime, MD  Pager:  [phone]  Second Contact: Marrianne Mood, MD  Pager:  [phone]       After Hours  (After 5pm / First Contact Pager: [phone]  weekends / holidays): Second Contact Pager: [phone]   SUBJECTIVE   Chief Complaint: seizure like activity   History of Present Illness: Joanna [name] is a 53 y.o. female with PMH of CKD Stage V, T2DM, right MCA ischemic stroke, intellectual disability, coming in with complaints of seizure like activity. She reports that she was in the car after being at [employer] with her sister. She reported feeling hungry although not feeling cold, clammy, or dizzy prior to the event and then having generalized body shaking. No tongue biting or incontinence during this time. No falls. Regained consciousness and was then groggy. She is fully alert and oriented x3 now.  She had a revision of LUE AVF this morning with [facility]. Reports stress/anxiety for her recent procedure. She had endorsed left arm numbness/heaviness and tingling post procedure. Did have nerve block. Able to move LUE but limited due to pain after the procedure. Reports hx of depression, denies any SI/HI. On chart review, she is getting a fistula as her ESRD is progressing.   Collateral obtained from sister (Joanna [name]). Joanna [name] was in the car and she wanted to go to the bathroom. She was talking and suddenly started shaking. Sister tried to get her attention and she would not talk to her. Had two episodes that lasted  2 minutes each with a 15 second lapse in between. Legs were kicking and arms were shaking.  Was not conscious when she had the shaking. Her upper body slid out of the seat. No incontinence after. Did have an  episode of stool incontinence yesterday which has never happened before. She remembered going to the bathroom but did not remember anything after that. Slowly she was able to remember things after. No tongue biting.  No seizures in the past, first time this happens. No family history of seizures. No fevers, chills, sick contacts. Has had headaches for past few days. No falls or head injury. She does use O2 at home (2L) 24/7 due to her CHF, and is getting fitted for a CPAP machine. She still produces urine.   ED Course: Vitals were 113/93, NSR, RR 20 saturating at 100%.  Labs significant for negative UPR, Glucose of 95, WBC 4.6, Hgb 8.0 (around baseline ~8-9), Sodium 139, K 4.2, Cr 5.05, BUN 98 Imaging: CT head unremarkable, chronic ischemic changes.EKG NSR, Prolonged Qtc ( )  Consulted Neurology - recommended MRI and EEG  Meds:   Current Meds  Medication Sig   allopurinol (ZYLOPRIM) 100 MG tablet Take 50 mg by mouth in the morning.   amLODipine (NORVASC) 10 MG tablet Take 1 tablet (10 mg total) by mouth daily.   aspirin EC 81 MG EC tablet Take 1 tablet (81 mg total) by mouth daily. Swallow whole.   atorvastatin (LIPITOR) 40 MG tablet Take 1 tablet (40 mg total) by mouth daily. (Patient taking differently: Take 40 mg by mouth  every evening.)   calcitRIOL (ROCALTROL) 0.5 MCG capsule Take 0.5 mcg by mouth in the morning.   carvedilol (COREG) 12.5 MG tablet Take 1 tablet (12.5 mg total) by mouth 2 (two) times daily with a meal.   hydrALAZINE (APRESOLINE) 100 MG tablet Take 100 mg by mouth 2 (two) times daily.   isosorbide mononitrate (IMDUR) 120 MG 24 hr tablet Take 1 tablet (120 mg total) by mouth daily.   torsemide (DEMADEX) 20 MG tablet Take 60 mg by mouth in the morning.    Past Medical History Past Medical History:  Diagnosis Date   Acute diastolic heart failure (HCC) 12/16/2019   Acute respiratory failure with hypoxia (HCC) 12/16/2019   AKI (acute kidney injury) (HCC) 12/16/2019    Anasarca 12/16/2019   Anemia    CAD (coronary artery disease)    Depression    Diabetes mellitus without complication (HCC)    HTN (hypertension) 12/16/2019   Hypertension    Hypertensive urgency 12/16/2019   Intellectual disability 12/16/2019   Morbid obesity (HCC)    New onset of congestive heart failure (HCC) 12/16/2019   Obesity, Class III, BMI 40-49.9 (morbid obesity) (HCC) 12/16/2019   Oxygen dependent    2L at all times   Sleep apnea    getting fitted for CPAP 4/14   Type 2 diabetes mellitus without complication (HCC) 12/16/2019    Past Surgical History Past Surgical History:  Procedure Laterality Date   AV FISTULA PLACEMENT Left 07/23/2023   Procedure: LEFT ARM BRACHIOCEPHALIC ARTERIOVENOUS (AV) FISTULA CREATION;  Surgeon: Daria Pastures, MD;  Location: [facility] OR;  Service: Vascular;  Laterality: Left;   CATARACT EXTRACTION Bilateral    Social:  Lives With: sister  Support: family Level of Function: independent with ADLs  PCP: Hillery Aldo, NP Substances: -Tobacco: denies  -Alcohol: denies -Recreational Drug: denies  Family History:  Family History  Problem Relation Age of Onset   Heart disease Mother        Enlarged heart per patient   Hypertension Mother    Diabetes Mother    Hypertension Sister    Diabetes Sister    Allergies: Allergies as of 09/17/2023   (No Known Allergies)    Review of Systems: A complete ROS was negative except as per HPI.   OBJECTIVE:   Physical Exam: Blood pressure (!) 113/93, pulse 76, temperature 97.8 F (36.6 C), resp. rate 20, last menstrual period 09/06/2023, SpO2 100%.  Constitutional: well-appearing, in NAD, resting comfortably in bed  HENT: normocephalic atraumatic, mucous membranes moist Eyes: conjunctiva non-erythematous Neck: supple Cardiovascular: regular rate and rhythm, no m/r/g Pulmonary/Chest: normal work of breathing on room air, lungs clear to auscultation bilaterally Abdominal: soft, non-tender,  non-distended MSK: normal bulk and tone Neurological: alert & oriented x 3, 5/5 strength in bilateral upper and lower extremities, No cranial nerve deficits, sensation intact.  Skin: warm and dry, erythema over incision from this morning covered in dermabond. Painful to palpation, palpable thrill. Radial pulses present bilaterally.     Psych: anxious   Labs: CBC    Component Value Date/Time   WBC 4.6 09/17/2023 1535   RBC 2.76 (L) 09/17/2023 1535   HGB 8.0 (L) 09/17/2023 1535   HCT 25.6 (L) 09/17/2023 1535   PLT 223 09/17/2023 1535   MCV 92.8 09/17/2023 1535   MCH 29.0 09/17/2023 1535   MCHC 31.3 09/17/2023 1535   RDW 17.2 (H) 09/17/2023 1535   LYMPHSABS 0.3 (L) 09/17/2023 1535   MONOABS 0.4 09/17/2023 1535  EOSABS 0.1 09/17/2023 1535   BASOSABS 0.0 09/17/2023 1535     CMP     Component Value Date/Time   NA 139 09/17/2023 1535   NA 140 01/18/2020 0951   K 4.2 09/17/2023 1535   CL 110 09/17/2023 1535   CO2 18 (L) 09/17/2023 1535   GLUCOSE 138 (H) 09/17/2023 1535   BUN 98 (H) 09/17/2023 1535   BUN 25 (H) 01/18/2020 0951   CREATININE 5.05 (H) 09/17/2023 1535   CALCIUM 8.9 09/17/2023 1535   PROT 6.1 (L) 09/17/2023 1535   ALBUMIN 3.4 (L) 09/17/2023 1535   AST 16 09/17/2023 1535   ALT 12 09/17/2023 1535   ALKPHOS 34 (L) 09/17/2023 1535   BILITOT 0.6 09/17/2023 1535   GFRNONAA 10 (L) 09/17/2023 1535   GFRAA 39 (L) 01/18/2020 0951    Imaging:  CT Head Wo Contrast CLINICAL DATA:  Seizure  EXAM: CT HEAD WITHOUT CONTRAST  TECHNIQUE: Contiguous axial images were obtained from the base of the skull through the vertex without intravenous contrast.  RADIATION DOSE REDUCTION: This exam was performed according to the departmental dose-optimization program which includes automated exposure control, adjustment of the mA and/or kV according to patient size and/or use of iterative reconstruction technique.  COMPARISON:  MRI brain 01/30/2023.  CT head  01/30/2023.  FINDINGS: Brain: No evidence of acute infarction, hemorrhage, hydrocephalus, extra-axial collection or mass lesion/mass effect. There stable mild periventricular white matter hypodensity compatible with chronic small vessel ischemic change.  Vascular: Atherosclerotic calcifications are present within the cavernous internal carotid arteries.  Skull: Normal. Negative for fracture or focal lesion.  Sinuses/Orbits: No acute finding.  Other: None.  IMPRESSION: 1. No acute intracranial process. 2. Stable mild chronic small vessel ischemic change.  Electronically Signed   By: Darliss Cheney M.D.   On: 09/17/2023 18:38   EKG: NSR, prolonged Qtc.  ASSESSMENT & PLAN:   Assessment & Plan by Problem: Principal Problem:   Seizure-like activity (HCC)  Joanna [name] is a 53 y.o. female with a PMH of CKD Stage V, T2DM, intellectual disability, right MCA ischemic stroke,  recent AV fistula revision this AM presenting with seizure like activity admitted for further work-up.   Seizure like activity  Described as generalized shaking of the arms and kicking of the legs that last approximately 2 min. She had two episodes with her sister, with about 15 sec breaks in between. On history, Joanna [name] told us that she was conscious during the events, has no BM or tongue biting, but sister told us that she became unresponsive during these episodes. Joanna [name] also had an unusual episode of bowel incontinence yesterday that had never happened before.  She also had a witnessed seizure with EMS. She was confused and groggy after but is currently not post-ictal. Has no prior history of seizures and no family history of seizures. However she does have structural abnormalities secondary to her prior CVA as well as a progressing ESRD that could increase her risk of seizures. Bun is elevated at 98 although she seems to have been around this level since November last year. She did have the AV fistula revision  earlier today, which has caused significant stress for her. Appreciate Neurology, MRI and EEG are pending. Nephro will need to be contacted tomorrow in case her elevated BUN may be causing her seizures and should there be a need for HD. She currently does not have access for HD. Currently, she is stable, AAO x 3, with no neurological deficits.  Her sensation on both arms are intact and has palpable radial pulses bilaterally. Hgb is stable, and less likely due to blood loss. CT head unremarkable. Doubt that this is infectious as her WBC is stable and currently afebrile. However, she did have erythema over incision as well as warmth but she did get the procedure done earlier this morning and could possibly be due to recent incision vs infection. Area of erythema has been marked to watch for progression. Vascular did evaluate her earlier with no concerns over incision. She does have a history of T2DM, and reported being very hungry after her procedure at [employer]. She denied feeling dizzy or clammy. Hgb A1c was 6.0 Glucose was 95, unlikely that it was due to hypoglycemia.   -Appreciate Neurology and Vascular -MRI brain  -EEG pending -Consult Nephro tomorrow  -frequent Neuro checks  -monitor incision, trend fever curve, consider blood cultures if WBC increases or develops a fever    CKD V, progression to ESRD not on dialysis  LUE AVF   Likely due to uncontrolled hypertension.  -trend renal function  -consider nephrology consult in AM  HFpEF On Torsemide 60mg  every morning. Producing some urine still. Mildly volume overloaded currently, with mild pitting edema but no crackles. Last Echo was done on 01/2023 and showed an EF of 50-55%, she does have moderate left ventricular hypertrophy and Grade III diastolic dysfunction. She did have moderately elevated pulmonary artery systolic pressure and moderately increased right ventricular wall thickness. At that time she did have a small pericardial effusion.  -cw  Torsemide 60mg  daily  -cw Coreg 12.5mg  BID  -cw Imdur 120mg  daily   HTN BP currently at 127/64. On almodipine 10mg  daily and Coreg 12.5mg  BID at home. Cw same.   Hx of CVA HLD Right MCA ischemic stroke on 01/2023 during admission for hypertensive urgency.  -continue home Liipitor 40 mg and ASA 81 mg daily   Chronic problems:   Gout- On allopurinol 50mg  daily. Seems like she has been tolerating it. However, may need to decrease dose eventually due to decreased renal function.    T2DM- Holding [place], last done March 18th. Hgb A1c 6.0, 7 months ago. Glucose 138 tonight. Monitor glucose for now.   Diet: renal VTE: Heparin IVF: None,None Code: Full Surrogate Decision Maker: Sister, Joanna [name]  Prior to Admission Living Arrangement: Home, living with sister  Anticipated Discharge Location: Home Barriers to Discharge: medical work-up  Dispo: Admit patient to Observation with expected length of stay less than 2 midnights.  Signed: Manuela Neptune, MD Internal Medicine Resident PGY-1 09/17/2023, 11:14 PM   Please contact IM Residency On-Call Pager at: [phone] or [phone].

## 2023-09-17 NOTE — ED Triage Notes (Signed)
 EMS from [place] parking lot, where pt had two witnessed seizures. Pt's sister describes as full body, less than 5 minutes apart.   Pt recently seen at this facility for Dialysis fistula placement. Pt still has sling on arm, and was released earlier this morn.  EMS states that upon loading the pt into the ambulance, pt had another full body seizure lasting less than 1 minute. Ems couldn't obtain a PIV, and reports no meds given.  Upon arrival pt A&Ox4, GCS 14.  Pt has no known history of seizures.

## 2023-09-17 NOTE — Transfer of Care (Signed)
 Immediate Anesthesia Transfer of Care Note  Patient: Joanna [name]  Procedure(s) Performed: FISTULA SUPERFICIALIZATION (Left: Arm Upper)  Patient Location: PACU  Anesthesia Type:MAC  Level of Consciousness: awake, alert , and oriented  Airway & Oxygen Therapy: Patient Spontanous Breathing  Post-op Assessment: Report given to RN and Post -op Vital signs reviewed and stable  Post vital signs: Reviewed and stable  Last Vitals:  Vitals Value Taken Time  BP 108/59 09/17/23 1111  Temp    Pulse 66 09/17/23 1113  Resp 20 09/17/23 1113  SpO2 96 % 09/17/23 1113  Vitals shown include unfiled device data.  Last Pain:  Vitals:   09/17/23 0910  TempSrc:   PainSc: 0-No pain         Complications: No notable events documented.

## 2023-09-18 ENCOUNTER — Inpatient Hospital Stay (HOSPITAL_COMMUNITY)
Admission: RE | Admit: 2023-09-18 | Discharge: 2023-09-18 | Disposition: A | Discharge status: Home / Self Care | Source: Ambulatory Visit | Attending: Internal Medicine | Admitting: Internal Medicine

## 2023-09-18 ENCOUNTER — Encounter (HOSPITAL_COMMUNITY): Payer: Self-pay | Admitting: Infectious Diseases

## 2023-09-18 ENCOUNTER — Other Ambulatory Visit (HOSPITAL_COMMUNITY): Payer: Self-pay

## 2023-09-18 ENCOUNTER — Observation Stay (HOSPITAL_COMMUNITY)

## 2023-09-18 DIAGNOSIS — I5032 Chronic diastolic (congestive) heart failure: Secondary | ICD-10-CM | POA: Diagnosis not present

## 2023-09-18 DIAGNOSIS — N185 Chronic kidney disease, stage 5: Secondary | ICD-10-CM

## 2023-09-18 DIAGNOSIS — E1122 Type 2 diabetes mellitus with diabetic chronic kidney disease: Secondary | ICD-10-CM | POA: Diagnosis not present

## 2023-09-18 DIAGNOSIS — R569 Unspecified convulsions: Secondary | ICD-10-CM | POA: Diagnosis not present

## 2023-09-18 LAB — RENAL FUNCTION PANEL
Albumin: 3 g/dL — ABNORMAL LOW (ref 3.5–5.0)
Anion gap: 9 (ref 5–15)
BUN: 91 mg/dL — ABNORMAL HIGH (ref 6–20)
CO2: 21 mmol/L — ABNORMAL LOW (ref 22–32)
Calcium: 8.7 mg/dL — ABNORMAL LOW (ref 8.9–10.3)
Chloride: 110 mmol/L (ref 98–111)
Creatinine, Ser: 4.72 mg/dL — ABNORMAL HIGH (ref 0.44–1.00)
GFR, Estimated: 11 mL/min — ABNORMAL LOW (ref >60)
Glucose, Bld: 93 mg/dL (ref 70–99)
Phosphorus: 4.6 mg/dL (ref 2.5–4.6)
Potassium: 4.6 mmol/L (ref 3.5–5.1)
Sodium: 140 mmol/L (ref 135–145)

## 2023-09-18 LAB — CBC
HCT: 25.4 % — ABNORMAL LOW (ref 36.0–46.0)
Hemoglobin: 8.1 g/dL — ABNORMAL LOW (ref 12.0–15.0)
MCH: 29 pg (ref 26.0–34.0)
MCHC: 31.9 g/dL (ref 30.0–36.0)
MCV: 91 fL (ref 80.0–100.0)
Platelets: 215 10*3/uL (ref 150–400)
RBC: 2.79 MIL/uL — ABNORMAL LOW (ref 3.87–5.11)
RDW: 17.1 % — ABNORMAL HIGH (ref 11.5–15.5)
WBC: 5.2 10*3/uL (ref 4.0–10.5)
nRBC: 0 % (ref 0.0–0.2)

## 2023-09-18 MED ORDER — LEVETIRACETAM 500 MG PO TABS
500.0000 mg | ORAL_TABLET | Freq: Every day | ORAL | 5 refills | Status: AC
  Filled 2023-09-18: qty 30, 30d supply, fill #0

## 2023-09-18 MED ORDER — CHLORHEXIDINE GLUCONATE CLOTH 2 % EX PADS: 6.0000 | MEDICATED_PAD | Freq: Every day | CUTANEOUS | Status: DC

## 2023-09-18 MED ORDER — LEVETIRACETAM 500 MG PO TABS: 500.0000 mg | ORAL_TABLET | Freq: Once | ORAL | Status: DC

## 2023-09-18 MED ORDER — MUPIROCIN 2 % EX OINT
1.0000 | TOPICAL_OINTMENT | Freq: Two times a day (BID) | CUTANEOUS | Status: DC
  Administered 2023-09-18: 1 via NASAL
  Filled 2023-09-18: qty 22

## 2023-09-18 MED ORDER — LEVETIRACETAM 500 MG PO TABS
500.0000 mg | ORAL_TABLET | Freq: Every day | ORAL | Status: DC
  Administered 2023-09-18: 500 mg via ORAL
  Filled 2023-09-18: qty 1

## 2023-09-18 NOTE — Anesthesia Postprocedure Evaluation (Signed)
 Anesthesia Post Note  Patient: Joanna [name]  Procedure(s) Performed: FISTULA SUPERFICIALIZATION (Left: Arm Upper)     Patient location during evaluation: PACU Anesthesia Type: Regional Level of consciousness: awake and alert Pain management: pain level controlled Vital Signs Assessment: post-procedure vital signs reviewed and stable Respiratory status: spontaneous breathing, nonlabored ventilation and respiratory function stable Cardiovascular status: stable and blood pressure returned to baseline Postop Assessment: no apparent nausea or vomiting Anesthetic complications: no   No notable events documented.  Last Vitals:  Vitals:   09/17/23 1145 09/17/23 1155  BP: 110/79 123/69  Pulse: 67 68  Resp: (!) 22 12  Temp:  (!) 36.3 C  SpO2: 95% 95%    Last Pain:  Vitals:   09/17/23 1155  TempSrc:   PainSc: 0-No pain                 Collene Schlichter

## 2023-09-18 NOTE — Plan of Care (Signed)
  Problem: Education: Goal: Knowledge of General Education information will improve Description: Including pain rating scale, medication(s)/side effects and non-pharmacologic comfort measures Outcome: Progressing   Problem: Health Behavior/Discharge Planning: Goal: Ability to manage health-related needs will improve Outcome: Progressing   Problem: Activity: Goal: Risk for activity intolerance will decrease Outcome: Progressing   Problem: Nutrition: Goal: Adequate nutrition will be maintained Outcome: Progressing   Problem: Elimination: Goal: Will not experience complications related to bowel motility Outcome: Progressing Goal: Will not experience complications related to urinary retention Outcome: Progressing   Problem: Pain Managment: Goal: General experience of comfort will improve and/or be controlled Outcome: Progressing   Problem: Skin Integrity: Goal: Risk for impaired skin integrity will decrease Outcome: Progressing   Problem: Coping: Goal: Level of anxiety will decrease Outcome: Not Progressing

## 2023-09-18 NOTE — TOC Transition Note (Signed)
 Transition of Care [facility]) - Discharge Note   Patient Details  Name: Joanna [name] MRN: [id number] Date of Birth: 1971/05/31  Transition of Care [facility]) CM/SW Contact:  Kermit Balo, RN Phone Number: 09/18/2023, 2:05 PM   Clinical Narrative:     Pt is discharging home with self care. No needs per TOC.  Final next level of care: Home/Self Care Barriers to Discharge: No Barriers Identified   Patient Goals and CMS Choice            Discharge Placement                       Discharge Plan and Services Additional resources added to the After Visit Summary for                                       Social Drivers of Health (SDOH) Interventions SDOH Screenings   Food Insecurity: No Food Insecurity (09/18/2023)  Housing: Low Risk  (09/18/2023)  Transportation Needs: No Transportation Needs (09/18/2023)  Utilities: Not At Risk (09/18/2023)  Financial Resource Strain: Medium Risk (11/30/2022)   Received from [facility]  Physical Activity: Insufficiently Active (11/30/2022)   Received from [facility]  Social Connections: Socially Integrated (11/30/2022)   Received from [facility]  Stress: No Stress Concern Present (11/30/2022)   Received from [facility]  Tobacco Use: Low Risk  (09/18/2023)     Readmission Risk Interventions    05/01/2023    3:32 PM  Readmission Risk Prevention Plan  Transportation Screening Complete  PCP or Specialist Appt within 3-5 Days Complete  HRI or Home Care Consult Complete  Palliative Care Screening Not Applicable  Medication Review (RN Care Manager) Complete

## 2023-09-18 NOTE — Discharge Summary (Signed)
 Name: Natania Finigan MRN: [id number] DOB: 1971/05/10 53 y.o. PCP: Hillery Aldo, NP  Date of Admission: 09/17/2023  2:00 PM Date of Discharge: 09/18/2023 Attending Physician: Dr.  Ninetta Lights  Discharge Diagnosis: Principal Problem:   Seizure-like activity (HCC) Active Problems:   Type 2 diabetes mellitus with stage 5 chronic kidney disease not on chronic dialysis (HCC)   CKD (chronic kidney disease) stage 5, GFR less than 15 ml/min (HCC)   Chronic heart failure with preserved ejection fraction [facility])    Discharge Medications: Allergies as of 09/18/2023   No Known Allergies      Medication List     TAKE these medications    acetaminophen 500 MG tablet Commonly known as: TYLENOL Take 500-1,000 mg by mouth every 6 (six) hours as needed for mild pain (pain score 1-3) (back pain).   allopurinol 100 MG tablet Commonly known as: ZYLOPRIM Take 50 mg by mouth in the morning.   amLODipine 10 MG tablet Commonly known as: NORVASC Take 1 tablet (10 mg total) by mouth daily.   aspirin EC 81 MG tablet Take 1 tablet (81 mg total) by mouth daily. Swallow whole.   atorvastatin 40 MG tablet Commonly known as: LIPITOR Take 1 tablet (40 mg total) by mouth daily. What changed: when to take this   calcitRIOL 0.5 MCG capsule Commonly known as: ROCALTROL Take 0.5 mcg by mouth in the morning.   carvedilol 12.5 MG tablet Commonly known as: COREG Take 1 tablet (12.5 mg total) by mouth 2 (two) times daily with a meal.   hydrALAZINE 100 MG tablet Commonly known as: APRESOLINE Take 100 mg by mouth 2 (two) times daily.   isosorbide mononitrate 120 MG 24 hr tablet Commonly known as: IMDUR Take 1 tablet (120 mg total) by mouth daily.   levETIRAcetam 500 MG tablet Commonly known as: KEPPRA Take 1 tablet (500 mg total) by mouth daily.   Mounjaro 2.5 MG/0.5ML Pen Generic drug: tirzepatide Inject 2.5 mg into the skin every Tuesday.   oxyCODONE-acetaminophen 5-325 MG tablet Commonly known as:  Percocet Take 1 tablet by mouth every 6 (six) hours as needed for severe pain (pain score 7-10).   OXYGEN Inhale 2 L into the lungs continuous.   torsemide 20 MG tablet Commonly known as: DEMADEX Take 60 mg by mouth in the morning.        Disposition and follow-up:   Ms.Rosita Ritzel was discharged from [facility] in Good condition.  At the hospital follow up visit please address:  1.  Follow-up:  a. Presented with seizure-like activity and discharged on Keppra 500 mg daily. Please ensure compliance.   2.  Labs / imaging needed at time of follow-up: None   3.  Pending labs/ test needing follow-up: None   4.  Medication Changes  Started on Keppra 500 mg Daily   Follow-up Appointments:  Follow-up Information     Hillery Aldo, NP Follow up.   Contact information: [address] [place] [address] [phone]                 Hospital Course by problem list: Concerns for seizure  Patient presented after a seizure like activity at the grocery store. On arrival, there were no  seizure like activity and her EEG was unrevealing.  Neurology was consulted to see patient who recommended initiating 500 mg keppra daily given that the patient is not a candidate of immediate dialysis per nephrology. Her  fistula site is clean, dry and intact .  Patient is discharged home in stable condition.   Discharge Subjective: Patient seen at bedside . She reported feeling better than yesterday. Responded approprietly to questions. She denied any chest pain, shortness of breath, tremors,N/V  Discharge Exam:   BP (!) 113/57 (BP Location: Right Arm)   Pulse 76   Temp 97.7 F (36.5 C) (Oral)   Resp 16   Ht 5\' 1"  (1.549 m)   Wt 88 kg   LMP 09/06/2023 (Approximate)   SpO2 100%   BMI 36.66 kg/m   Constitutional: well-appearing woman, sitting in bed, in no acute distress HENT: normocephalic atraumatic, mucous membranes moist Eyes: conjunctiva  non-erythematous Neck: supple Cardiovascular: regular rate and rhythm, no m/r/g Pulmonary/Chest: normal work of breathing on room air, lungs clear to auscultation bilaterally Abdominal: soft, non-tender, non-distended MSK: normal bulk and tone Neurological: alert & oriented x 3, 5/5 strength in bilateral upper and lower extremities.No focal deficit Skin: warm and dry. AVF site on left arm is clean and dry and intact  Psych: Normal mood and affect    Pertinent Labs, Studies, and Procedures:     Latest Ref Rng & Units 09/18/2023    5:18 AM 09/17/2023    3:35 PM 09/17/2023    8:34 AM  CBC  WBC 4.0 - 10.5 K/uL 5.2  4.6    Hemoglobin 12.0 - 15.0 g/dL 8.1  8.0  9.5   Hematocrit 36.0 - 46.0 % 25.4  25.6  28.0   Platelets 150 - 400 K/uL 215  223         Latest Ref Rng & Units 09/18/2023    5:18 AM 09/17/2023    3:35 PM 09/17/2023    8:34 AM  CMP  Glucose 70 - 99 mg/dL 93  324  401   BUN 6 - 20 mg/dL 91  98  95   Creatinine 0.44 - 1.00 mg/dL 0.27  2.53  6.64   Sodium 135 - 145 mmol/L 140  139  139   Potassium 3.5 - 5.1 mmol/L 4.6  4.2  4.6   Chloride 98 - 111 mmol/L 110  110  111   CO2 22 - 32 mmol/L 21  18    Calcium 8.9 - 10.3 mg/dL 8.7  8.9    Total Protein 6.5 - 8.1 g/dL  6.1    Total Bilirubin 0.0 - 1.2 mg/dL  0.6    Alkaline Phos 38 - 126 U/L  34    AST 15 - 41 U/L  16    ALT 0 - 44 U/L  12      EEG adult Result Date: 09/18/2023 Charlsie Quest, MD     09/18/2023  1:13 PM Patient Name: Maylin Freeburg MRN: [id number] Epilepsy Attending: Charlsie Quest Referring Physician/Provider: Melene Plan, DO Date: 09/18/2023 Duration: 22.19 mins Patient history: 53 y.o. female with above past medical history who recently had a AV fistula placed for dialysis, with multiple episodes of full body shaking concerning for seizures. She does not remember any of these episodes and the witnessed people describe this as seizures. EEG to evaluate for seizure Level of alertness: Awake, asleep AEDs during EEG study: LEV  Technical aspects: This EEG study was done with scalp electrodes positioned according to the 10-20 International system of electrode placement. Electrical activity was reviewed with band pass filter of 1-70Hz , sensitivity of 7 uV/mm, display speed of 53mm/sec with a 60Hz  notched filter applied as appropriate. EEG data were recorded continuously and digitally stored.  Video monitoring was  available and reviewed as appropriate. Description: The posterior dominant rhythm consists of 9 Hz activity of moderate voltage (25-35 uV) seen predominantly in posterior head regions, symmetric and reactive to eye opening and eye closing. Sleep was characterized by vertex waves, sleep spindles (12 to 14 Hz), maximal frontocentral region. Hyperventilation and photic stimulation were not performed.   IMPRESSION: This study is within normal limits. No seizures or epileptiform discharges were seen throughout the recording. A normal interictal EEG does not exclude the diagnosis of epilepsy. Charlsie Quest   MR BRAIN WO CONTRAST Result Date: 09/18/2023 CLINICAL DATA:  Seizure EXAM: MRI HEAD WITHOUT CONTRAST TECHNIQUE: Multiplanar, multiecho pulse sequences of the brain and surrounding structures were obtained without intravenous contrast. COMPARISON:  01/30/2023 FINDINGS: Brain: No acute infarct, mass effect or extra-axial collection. Multiple chronic microhemorrhages in a predominantly central distribution. There is multifocal hyperintense T2-weighted signal within the white matter. Parenchymal volume and CSF spaces are normal. The midline structures are normal. Vascular: Normal flow voids. Skull and upper cervical spine: Normal calvarium and skull base. Visualized upper cervical spine and soft tissues are normal. Sinuses/Orbits:No paranasal sinus fluid levels or advanced mucosal thickening. No mastoid or middle ear effusion. Normal orbits. IMPRESSION: 1. No acute intracranial abnormality. 2. Multiple chronic microhemorrhages in a  predominantly central distribution, which may be due to hypertensive angiopathy. 3. Findings of chronic small vessel ischemia. Electronically Signed   By: Deatra Robinson M.D.   On: 09/18/2023 00:19   CT Head Wo Contrast Result Date: 09/17/2023 CLINICAL DATA:  Seizure EXAM: CT HEAD WITHOUT CONTRAST TECHNIQUE: Contiguous axial images were obtained from the base of the skull through the vertex without intravenous contrast. RADIATION DOSE REDUCTION: This exam was performed according to the departmental dose-optimization program which includes automated exposure control, adjustment of the mA and/or kV according to patient size and/or use of iterative reconstruction technique. COMPARISON:  MRI brain 01/30/2023.  CT head 01/30/2023. FINDINGS: Brain: No evidence of acute infarction, hemorrhage, hydrocephalus, extra-axial collection or mass lesion/mass effect. There stable mild periventricular white matter hypodensity compatible with chronic small vessel ischemic change. Vascular: Atherosclerotic calcifications are present within the cavernous internal carotid arteries. Skull: Normal. Negative for fracture or focal lesion. Sinuses/Orbits: No acute finding. Other: None. IMPRESSION: 1. No acute intracranial process. 2. Stable mild chronic small vessel ischemic change. Electronically Signed   By: Darliss Cheney M.D.   On: 09/17/2023 18:38     Discharge Instructions: They were admitted to [facility] on 09/17/2023 for evaluation and treatment of:  Principal Problem:   Seizure-like activity (HCC) Active Problems:   Type 2 diabetes mellitus with stage 5 chronic kidney disease not on chronic dialysis (HCC)   CKD (chronic kidney disease) stage 5, GFR less than 15 ml/min (HCC)   Chronic heart failure with preserved ejection fraction (HCC)  Resolved Problems:   * No resolved hospital problems. *  The evaluation suggested possible seizures . They were treated for seizure work up.  They were discharged  from the hospital on 09/18/23. I recommend the following after leaving the hospital:   Please take keppra 500 mg daily moving forward.  For questions about your care plan, until you are able to see your primary doctor: Call [phone]. Dial 0 for the operator. Ask for the internal medicine resident on call.   Signed: Kathleen Lime, MD Internal Medicine Teaching Service Pager [phone]

## 2023-09-18 NOTE — Discharge Instructions (Signed)
 To Ms. Joanna [name] or their caretakers,  They were admitted to [facility] on 09/17/2023 for evaluation and treatment of:  Principal Problem:   Seizure-like activity [facility]) Active Problems:   Type 2 diabetes mellitus with stage 5 chronic kidney disease not on chronic dialysis (HCC)   CKD (chronic kidney disease) stage 5, GFR less than 15 ml/min (HCC)   Chronic heart failure with preserved ejection fraction (HCC)  Resolved Problems:   * No resolved hospital problems. *  The evaluation suggested possible seizures . They were treated for seizure work up.  They were discharged from the hospital on 09/18/23. I recommend the following after leaving the hospital:   Please take keppra 500 mg daily moving forward   For questions about your care plan, until you are able to see your primary doctor: Call [phone]. Dial 0 for the operator. Ask for the internal medicine resident on call.  Kathleen Lime, MD 09/18/2023, 1:37 PM

## 2023-09-18 NOTE — Progress Notes (Signed)
 Pt admitted from ED with diagnosis of Seizures, pt alert and oriented. C/o of pain in left arm, settled in bed with call light within pt's reach, tele monitor put and verified on pt, safety concern initiated, was however reassured and will continue to monitor. [place]-Asidi, Joanna [name]

## 2023-09-18 NOTE — Progress Notes (Signed)
 EEG complete - results pending

## 2023-09-18 NOTE — Progress Notes (Addendum)
 NEUROLOGY CONSULT FOLLOW UP NOTE   Date of service: September 18, 2023 Patient Name: Joanna [name] MRN:  [id number] DOB:  1971-06-18  Interval Hx/subjective   No family at bedside. Patient sitting up in bed, NAD. Denies headache, SOB, numbness, seizure-like episodes over night. Does c/o continues soreness to LUE.  MRI negative, Pending EEG.   Vitals   Vitals:   09/18/23 0057 09/18/23 0200 09/18/23 0417 09/18/23 0828  BP: 135/63 (!) 144/66 (!) 141/66 127/65  Pulse: 79 80 81 81  Resp: 16     Temp:   99.1 F (37.3 C) 98.1 F (36.7 C)  TempSrc:   Oral Oral  SpO2: 95% (!) 89% 100% 100%     There is no height or weight on file to calculate BMI.  Physical Exam   Constitutional: Appears well-developed and well-nourished.  Psych: Affect appropriate to situation.  Cardiovascular: Normal rate and regular rhythm.  Vascular: LUE bruit has strong bruit and thrill.  Respiratory: Effort normal, non-labored breathing. 2L [place] GI: Soft.  No distension. There is no tenderness.  Skin: WDI. LUE fistula  Neurologic Examination   Neuro: Mental Status: Patient is awake, alert, oriented to person, place, month, year, and situation. Patient is able to give coherent history with baseline intellectual disabilities.  No signs of aphasia or neglect. Baseline stuttering present. Naming and repetition intact.  Cranial Nerves: II to XII grossly intact Motor: Tone is normal. Bulk is normal.  Good overall strength with no drifts. LUE is limited by pain.  Sensory: Sensation is symmetric to light touch  in the arms and legs. Cerebellar: FNF and HKS are intact bilaterally. LUE limited due to pain.    Medications  Current Facility-Administered Medications:    acetaminophen (TYLENOL) tablet 650 mg, 650 mg, Oral, Q6H PRN **OR** acetaminophen (TYLENOL) suppository 650 mg, 650 mg, Rectal, Q6H PRN, Rana Snare, DO   allopurinol (ZYLOPRIM) tablet 50 mg, 50 mg, Oral, Daily, Rana Snare, DO   amLODipine  (NORVASC) tablet 10 mg, 10 mg, Oral, Daily, Rana Snare, DO   aspirin EC tablet 81 mg, 81 mg, Oral, Daily, Rana Snare, DO   atorvastatin (LIPITOR) tablet 40 mg, 40 mg, Oral, Daily, Rana Snare, DO   carvedilol (COREG) tablet 12.5 mg, 12.5 mg, Oral, BID WC, Rana Snare, DO   Chlorhexidine Gluconate Cloth 2 % PADS 6 each, 6 each, Topical, Daily, Ninetta Lights, Lacretia Leigh, MD   heparin injection 5,000 Units, 5,000 Units, Subcutaneous, Q8H, Rana Snare, DO, 5,000 Units at 09/18/23 7829   isosorbide mononitrate (IMDUR) 24 hr tablet 120 mg, 120 mg, Oral, Daily, Rana Snare, DO   mupirocin ointment (BACTROBAN) 2 % 1 Application, 1 Application, Nasal, BID, Hatcher, Lacretia Leigh, MD   senna-docusate (Senokot-S) tablet 1 tablet, 1 tablet, Oral, QHS PRN, Rana Snare, DO   torsemide (DEMADEX) tablet 60 mg, 60 mg, Oral, Daily, Rana Snare, DO  Labs and Diagnostic Imaging   CBC:  Recent Labs  Lab 09/17/23 1535 09/18/23 0518  WBC 4.6 5.2  NEUTROABS 3.7  --   HGB 8.0* 8.1*  HCT 25.6* 25.4*  MCV 92.8 91.0  PLT 223 215    Basic Metabolic Panel:  Lab Results  Component Value Date   NA 140 09/18/2023   K 4.6 09/18/2023   CO2 21 (L) 09/18/2023   GLUCOSE 93 09/18/2023   BUN 91 (H) 09/18/2023   CREATININE 4.72 (H) 09/18/2023   CALCIUM 8.7 (L) 09/18/2023   GFRNONAA 11 (L) 09/18/2023   GFRAA 39 (L) 01/18/2020  Lipid Panel: No results found for: "LDLCALC" HgbA1c:  Lab Results  Component Value Date   HGBA1C 6.0 (H) 01/29/2023   Urine Drug Screen: No results found for: "LABOPIA", "COCAINSCRNUR", "LABBENZ", "AMPHETMU", "THCU", "LABBARB"  Alcohol Level No results found for: "ETH" INR  Lab Results  Component Value Date   INR 1.3 (H) 04/27/2023   APTT  Lab Results  Component Value Date   APTT 36 04/27/2023   AED levels: No results found for: "PHENYTOIN", "ZONISAMIDE", "LAMOTRIGINE", "LEVETIRACETA"  CT Head without contrast(Personally reviewed): No acute intracranial  process Stable chronic mild small vessel ischemic changes   MRI Brain(Personally reviewed): No acute abnormality Multiple chronic microhemorrhages, ?hypertensive angiopathy Chronic small vessel ischemia  rEEG:  PENDING  Assessment   Joanna [name] is a 53 y.o. female with above past medical history who recently had a AV fistula placed for dialysis, with multiple episodes of full body shaking concerning for seizures.  She does not remember any of these episodes and the witnessed people describe this as seizures.  Given her history of ESRD, metabolic derangements could have lowered seizure threshold-she also has a history of intellectual disability.  Given this is a cluster of seizures all together- consider this as 1 episode and would wait to start ASD unless the EEG or MRI show any abnormality or if the seizures recur. MRI brain negative, shows no acute changes or any reason for seizures.   However, patient continues to have evidence of toxic-metabolic encephalopathy which increases her risk of additional seizures. Will obtain EEG and start on maintenance dose of Keppra 500mg  daily due to Cr clearance until she is able to have dialysis.  Recommendations  - Continue frequent neuro checks - Pending EEG read - Start Keppra 500mg  Daily, continue until able to have dialysis  If EEG read is negative. Neurology will sign off.   ______________________________________________________________________    Pt seen by Neuro NP/APP and later by MD. Note/plan to be edited by MD as needed.    Lynnae January, DNP, AGACNP-BC [facility] Please use AMION for contact information & EPIC for messaging.  I evaluated patient independently and agree with A/P with the following addition.  Patient who presents with 3 consecutive seizures. No Hx of childhood, febrile seizures, no Hx of brain tumor, trauma or Fhx of seizures. She does have Hx of intellectual disability. My suspicion is that these are  provoked seizures given her toxic-metabolic derangements due to ESRD (BUN 91, Cr 4.2). I spoke with primary team about dialysis, which will not be started during this admission. I recommend starting patient on Keppra 500mg  daily (ESRD dose) UNTIL she is able to start dialysis. Keppra can be discontinued at that time. We will get rEEG for any focal epileptiform discharges. She should also follow up with Neurology outpatient.    Anibal Henderson, MD [facility]

## 2023-09-18 NOTE — Evaluation (Signed)
 Physical Therapy Brief Evaluation and Discharge Note Patient Details Name: Joanna [name] MRN: [id number] DOB: [birth year] Today's Date: 09/18/2023   History of Present Illness  Pt is a 53 y.o. F who presents 09/17/2023 with concern for seizure. MRI brain negative. EEG within normal limits. Significant PMH: recent AV fistula placed for dialysis, intellectual disability.  Clinical Impression  Pt admitted with above. PTA, pt lives with her sister in an apartment and reports she is independent with ambulation. Pt appears to be close to her functional baseline. Ambulating hallway distances ~200 ft with and without a Rollator at a supervision level, with good cadence and no gross instability. Negotiated 2 steps with railings to simulate apartment entrance. SpO2 97% on 3L O2, HR 79-83 bpm. Set pt up in chair with coloring sheets for engagement at end of session. No follow up PT recommended. Thank you for this consult.     PT Assessment Patient does not need any further PT services  Assistance Needed at Discharge  PRN    Equipment Recommendations None recommended by PT  Recommendations for Other Services       Precautions/Restrictions Precautions Precautions: Fall Restrictions Weight Bearing Restrictions Per Provider Order: No        Mobility  Bed Mobility   Supine/Sidelying to sit: Modified independent (Device/Increased time)      Transfers Overall transfer level: Independent Equipment used: None                    Ambulation/Gait Ambulation/Gait assistance: Supervision Gait Distance (Feet): 200 Feet Assistive device: Rollator (4 wheels) Gait Pattern/deviations: Step-through pattern   General Gait Details: Good cadence, steady pace with and without Rollator  Home Activity Instructions    Stairs Stairs: Yes Stairs assistance: Supervision Stair Management: Two rails Number of Stairs: 2    Modified Rankin (Stroke Patients Only)        Balance Overall balance  assessment: Mild deficits observed, not formally tested                        Pertinent Vitals/Pain   Pain Assessment Pain Assessment: No/denies pain     Home Living Family/patient expects to be discharged to:: Private residence Living Arrangements: Other relatives (sister) Available Help at Discharge: Family Home Environment: Stairs to enter  Landscape architect of Steps: 3 Home Equipment: Rollator (4 wheels)        Prior Function Level of Independence: Independent      UE/LE Assessment   UE ROM/Strength/Tone/Coordination: WFL    LE ROM/Strength/Tone/Coordination: [facility]      Communication   Communication Communication: No apparent difficulties     Cognition Overall Cognitive Status:  (Hx intellectual disability)       General Comments      Exercises     Assessment/Plan    PT Problem List         PT Visit Diagnosis Unsteadiness on feet (R26.81)    No Skilled PT All education completed;Patient at baseline level of functioning;Patient is supervision for all activity/mobility   Co-evaluation                AMPAC 6 Clicks Help needed turning from your back to your side while in a flat bed without using bedrails?: None Help needed moving from lying on your back to sitting on the side of a flat bed without using bedrails?: None Help needed moving to and from a bed to a chair (including a wheelchair)?: None Help needed standing up  from a chair using your arms (e.g., wheelchair or bedside chair)?: None Help needed to walk in hospital room?: A Little Help needed climbing 3-5 steps with a railing? : A Little 6 Click Score: 22      End of Session   Activity Tolerance: Patient tolerated treatment well Patient left: in chair;with call bell/phone within reach;with chair alarm set Nurse Communication: Mobility status PT Visit Diagnosis: Unsteadiness on feet (R26.81)     Time: 8469-6295 PT Time Calculation (min) (ACUTE ONLY): 22 min  Charges:    PT Evaluation $PT Eval Low Complexity: 1 Low      Lillia Pauls, PT, DPT Acute Rehabilitation Services Office [phone]   Norval Morton  09/18/2023, 2:31 PM

## 2023-09-18 NOTE — Procedures (Signed)
 Patient Name: Joanna [name]  MRN: [id number]  Epilepsy Attending: Charlsie Quest  Referring Physician/Provider: Melene Plan, DO  Date: 09/18/2023 Duration: 22.19 mins  Patient history: 53 y.o. female with above past medical history who recently had a AV fistula placed for dialysis, with multiple episodes of full body shaking concerning for seizures. She does not remember any of these episodes and the witnessed people describe this as seizures. EEG to evaluate for seizure  Level of alertness: Awake, asleep  AEDs during EEG study: LEV  Technical aspects: This EEG study was done with scalp electrodes positioned according to the 10-20 International system of electrode placement. Electrical activity was reviewed with band pass filter of 1-70Hz , sensitivity of 7 uV/mm, display speed of 72mm/sec with a 60Hz  notched filter applied as appropriate. EEG data were recorded continuously and digitally stored.  Video monitoring was available and reviewed as appropriate.  Description: The posterior dominant rhythm consists of 9 Hz activity of moderate voltage (25-35 uV) seen predominantly in posterior head regions, symmetric and reactive to eye opening and eye closing. Sleep was characterized by vertex waves, sleep spindles (12 to 14 Hz), maximal frontocentral region. Hyperventilation and photic stimulation were not performed.     IMPRESSION: This study is within normal limits. No seizures or epileptiform discharges were seen throughout the recording.  A normal interictal EEG does not exclude the diagnosis of epilepsy.  Joanna [name]

## 2023-09-18 NOTE — Care Management Obs Status (Signed)
 MEDICARE OBSERVATION STATUS NOTIFICATION   Patient Details  Name: Joanna [name] MRN: [id number] Date of Birth: 12-Feb-1971   Medicare Observation Status Notification Given:  Yes  MoomLetter Given  Dorena Bodo 09/18/2023, 2:04 PM

## 2023-09-27 ENCOUNTER — Encounter (HOSPITAL_COMMUNITY)
Admission: RE | Admit: 2023-09-27 | Discharge: 2023-09-27 | Disposition: A | Discharge status: Home / Self Care | Source: Ambulatory Visit | Attending: Internal Medicine | Admitting: Internal Medicine

## 2023-09-27 VITALS — BP 138/72 | HR 77 | Temp 97.2°F | Resp 16

## 2023-09-27 DIAGNOSIS — N184 Chronic kidney disease, stage 4 (severe): Secondary | ICD-10-CM | POA: Insufficient documentation

## 2023-09-27 LAB — RENAL FUNCTION PANEL
Albumin: 3.4 g/dL — ABNORMAL LOW (ref 3.5–5.0)
Anion gap: 12 (ref 5–15)
BUN: 86 mg/dL — ABNORMAL HIGH (ref 6–20)
CO2: 19 mmol/L — ABNORMAL LOW (ref 22–32)
Calcium: 9.2 mg/dL (ref 8.9–10.3)
Chloride: 106 mmol/L (ref 98–111)
Creatinine, Ser: 4.85 mg/dL — ABNORMAL HIGH (ref 0.44–1.00)
GFR, Estimated: 10 mL/min — ABNORMAL LOW (ref >60)
Glucose, Bld: 95 mg/dL (ref 70–99)
Phosphorus: 5.2 mg/dL — ABNORMAL HIGH (ref 2.5–4.6)
Potassium: 5.1 mmol/L (ref 3.5–5.1)
Sodium: 137 mmol/L (ref 135–145)

## 2023-09-27 LAB — POCT HEMOGLOBIN-HEMACUE: Hemoglobin: 7.4 g/dL — ABNORMAL LOW (ref 12.0–15.0)

## 2023-09-27 LAB — IRON AND TIBC
Iron: 36 ug/dL (ref 28–170)
Saturation Ratios: 13 % (ref 10.4–31.8)
TIBC: 286 ug/dL (ref 250–450)
UIBC: 250 ug/dL

## 2023-09-27 LAB — FERRITIN: Ferritin: 119 ng/mL (ref 11–307)

## 2023-09-27 MED ORDER — EPOETIN ALFA-EPBX 40000 UNIT/ML IJ SOLN
INTRAMUSCULAR | Status: AC
  Administered 2023-09-27: 20000 [IU] via SUBCUTANEOUS
  Filled 2023-09-27: qty 1

## 2023-09-27 MED ORDER — EPOETIN ALFA-EPBX 10000 UNIT/ML IJ SOLN: 20000.0000 [IU] | INTRAMUSCULAR | Status: DC

## 2023-09-27 NOTE — Progress Notes (Signed)
 Pt here for retacrit today, hgb 7.4, denies any chest pain, shortness of breath, or seeing any bleeding. Pt says they are having no symptoms.  Pt stated she was in the hospital last week for having a seizure and noted she was D/C on 09/17/23, and we have not seen her since 09/04/23.  Reported the above to [place] at [facility], no changes made and orders given to continue with current treatment plan.

## 2023-09-28 LAB — PTH, INTACT AND CALCIUM
Calcium, Total (PTH): 9.1 mg/dL (ref 8.7–10.2)
PTH: 189 pg/mL — ABNORMAL HIGH (ref 15–65)

## 2023-10-02 ENCOUNTER — Encounter (HOSPITAL_COMMUNITY)

## 2023-10-11 ENCOUNTER — Encounter (HOSPITAL_COMMUNITY)

## 2023-10-14 ENCOUNTER — Inpatient Hospital Stay (HOSPITAL_COMMUNITY): Admission: RE | Admit: 2023-10-14 | Source: Ambulatory Visit

## 2023-10-16 ENCOUNTER — Encounter

## 2023-10-23 ENCOUNTER — Encounter (HOSPITAL_COMMUNITY)
Admission: RE | Admit: 2023-10-23 | Discharge: 2023-10-23 | Disposition: A | Discharge status: Home / Self Care | Source: Ambulatory Visit | Attending: Internal Medicine | Admitting: Internal Medicine

## 2023-10-23 ENCOUNTER — Ambulatory Visit: Attending: Vascular Surgery | Admitting: Physician Assistant

## 2023-10-23 ENCOUNTER — Encounter: Payer: Self-pay | Admitting: Physician Assistant

## 2023-10-23 VITALS — BP 124/78 | HR 70 | Temp 97.8°F | Ht 61.0 in | Wt 197.0 lb

## 2023-10-23 VITALS — BP 114/56 | HR 71 | Temp 97.3°F | Resp 17

## 2023-10-23 DIAGNOSIS — N184 Chronic kidney disease, stage 4 (severe): Secondary | ICD-10-CM

## 2023-10-23 LAB — RENAL FUNCTION PANEL
Albumin: 3.5 g/dL (ref 3.5–5.0)
Anion gap: 10 (ref 5–15)
BUN: 94 mg/dL — ABNORMAL HIGH (ref 6–20)
CO2: 22 mmol/L (ref 22–32)
Calcium: 9.1 mg/dL (ref 8.9–10.3)
Chloride: 106 mmol/L (ref 98–111)
Creatinine, Ser: 4.69 mg/dL — ABNORMAL HIGH (ref 0.44–1.00)
GFR, Estimated: 11 mL/min — ABNORMAL LOW (ref >60)
Glucose, Bld: 110 mg/dL — ABNORMAL HIGH (ref 70–99)
Phosphorus: 5.1 mg/dL — ABNORMAL HIGH (ref 2.5–4.6)
Potassium: 5.3 mmol/L — ABNORMAL HIGH (ref 3.5–5.1)
Sodium: 138 mmol/L (ref 135–145)

## 2023-10-23 LAB — POCT HEMOGLOBIN-HEMACUE: Hemoglobin: 8.2 g/dL — ABNORMAL LOW (ref 12.0–15.0)

## 2023-10-23 LAB — FERRITIN: Ferritin: 131 ng/mL (ref 11–307)

## 2023-10-23 LAB — IRON AND TIBC
Iron: 60 ug/dL (ref 28–170)
Saturation Ratios: 21 % (ref 10.4–31.8)
TIBC: 283 ug/dL (ref 250–450)
UIBC: 223 ug/dL

## 2023-10-23 MED ORDER — EPOETIN ALFA-EPBX 10000 UNIT/ML IJ SOLN: 20000.0000 [IU] | Freq: Once | INTRAMUSCULAR | Status: AC

## 2023-10-23 MED ORDER — EPOETIN ALFA-EPBX 10000 UNIT/ML IJ SOLN
INTRAMUSCULAR | Status: AC
  Administered 2023-10-23: 20000 [IU] via SUBCUTANEOUS
  Filled 2023-10-23: qty 2

## 2023-10-23 NOTE — Progress Notes (Signed)
 POST OPERATIVE OFFICE NOTE    CC:  F/u for surgery  HPI:  This is a 53 y.o. female who is s/p revision of left arm cephalic vein fistula with transposition on 09/17/2023 by Dr. Vikki Graves.  Creation of left BC AVF was 07/23/2023 by Dr. Susi Eric.  She states after she left the hospital after surgery she had a seizure but doing ok now.    Pt states she does not have pain/numbness in the left hand.    The pt is not yet on dialysis.  Her nephrologist is Dr. Higinio Love.   No Known Allergies  Current Outpatient Medications  Medication Sig Dispense Refill   acetaminophen  (TYLENOL ) 500 MG tablet Take 500-1,000 mg by mouth every 6 (six) hours as needed for mild pain (pain score 1-3) (back pain).     allopurinol  (ZYLOPRIM ) 100 MG tablet Take 50 mg by mouth in the morning.     amLODipine  (NORVASC ) 10 MG tablet Take 1 tablet (10 mg total) by mouth daily. 30 tablet 0   aspirin  EC 81 MG EC tablet Take 1 tablet (81 mg total) by mouth daily. Swallow whole. 30 tablet 11   atorvastatin  (LIPITOR) 40 MG tablet Take 1 tablet (40 mg total) by mouth daily. (Patient taking differently: Take 40 mg by mouth every evening.) 30 tablet 0   calcitRIOL (ROCALTROL) 0.5 MCG capsule Take 0.5 mcg by mouth in the morning.     carvedilol  (COREG ) 12.5 MG tablet Take 1 tablet (12.5 mg total) by mouth 2 (two) times daily with a meal. 60 tablet 0   hydrALAZINE  (APRESOLINE ) 100 MG tablet Take 100 mg by mouth 2 (two) times daily.     isosorbide  mononitrate (IMDUR ) 120 MG 24 hr tablet Take 1 tablet (120 mg total) by mouth daily. 30 tablet 0   levETIRAcetam  (KEPPRA ) 500 MG tablet Take 1 tablet (500 mg total) by mouth daily. 30 tablet 5   MOUNJARO 2.5 MG/0.5ML Pen Inject 2.5 mg into the skin every Tuesday.     oxyCODONE -acetaminophen  (PERCOCET) 5-325 MG tablet Take 1 tablet by mouth every 6 (six) hours as needed for severe pain (pain score 7-10). 20 tablet 0   OXYGEN Inhale 2 L into the lungs continuous.     torsemide  (DEMADEX ) 20 MG tablet  Take 60 mg by mouth in the morning.     No current facility-administered medications for this visit.     ROS:  See HPI  Physical Exam:  Today's Vitals   10/23/23 0948  BP: 124/78  Pulse: 70  Temp: 97.8 F (36.6 C)  TempSrc: Temporal  SpO2: 96%  Weight: 197 lb (89.4 kg)  Height: 5\' 1"  (1.549 m)   Body mass index is 37.22 kg/m.   Incision:  healed nicely Extremities:   There is a palpable left radial pulse.   Motor and sensory are in tact.   There is a thrill/bruit present.  Access is  easily palpable    Assessment/Plan:  This is a 53 y.o. female who is s/p: revision of left arm cephalic vein fistula with transposition on 09/17/2023 by Dr. Vikki Graves.  Creation of left BC AVF was 07/23/2023 by Dr. Susi Eric.  -the pt does not have evidence of steal. -pt's access can be used now if needed. -discussed with pt that access does not last forever and will need intervention or even new access at some point.  -the pt will follow up as needed.    Maryanna Smart, [facility] [phone]  Clinic MD:  Vikki Graves

## 2023-10-25 ENCOUNTER — Encounter (HOSPITAL_COMMUNITY)

## 2023-10-28 ENCOUNTER — Encounter (HOSPITAL_COMMUNITY)

## 2023-11-06 ENCOUNTER — Encounter (HOSPITAL_COMMUNITY)
Admission: RE | Admit: 2023-11-06 | Discharge: 2023-11-06 | Disposition: A | Discharge status: Home / Self Care | Source: Ambulatory Visit | Attending: Internal Medicine | Admitting: Internal Medicine

## 2023-11-06 VITALS — BP 141/68 | HR 71 | Temp 97.7°F | Resp 17

## 2023-11-06 DIAGNOSIS — N184 Chronic kidney disease, stage 4 (severe): Secondary | ICD-10-CM | POA: Diagnosis not present

## 2023-11-06 LAB — POCT HEMOGLOBIN-HEMACUE: Hemoglobin: 8.3 g/dL — ABNORMAL LOW (ref 12.0–15.0)

## 2023-11-06 MED ORDER — EPOETIN ALFA-EPBX 10000 UNIT/ML IJ SOLN
20000.0000 [IU] | Freq: Once | INTRAMUSCULAR | Status: AC
  Administered 2023-11-06: 20000 [IU] via SUBCUTANEOUS

## 2023-11-06 MED ORDER — EPOETIN ALFA-EPBX 10000 UNIT/ML IJ SOLN
INTRAMUSCULAR | Status: AC
  Filled 2023-11-06: qty 2

## 2023-11-18 ENCOUNTER — Telehealth: Payer: Self-pay

## 2023-11-18 NOTE — Telephone Encounter (Signed)
 Auth Submission: NO AUTH NEEDED Site of care: Site of care: [facility] Payer: [employer] dual complete Medication & CPT/J Code(s) submitted: Retacrit  Q5106 Route of submission (phone, fax, portal): portal Phone # Fax # Auth type: Buy/Bill PB Units/visits requested: 20,000u, q2weeks Reference number: [id number] Approval from: 11/18/23 to 06/17/24

## 2023-11-20 ENCOUNTER — Inpatient Hospital Stay (HOSPITAL_COMMUNITY): Admission: RE | Admit: 2023-11-20 | Source: Ambulatory Visit

## 2023-11-21 ENCOUNTER — Encounter (HOSPITAL_COMMUNITY)
Admission: RE | Admit: 2023-11-21 | Discharge: 2023-11-21 | Disposition: A | Discharge status: Home / Self Care | Source: Ambulatory Visit | Attending: Internal Medicine | Admitting: Internal Medicine

## 2023-11-21 VITALS — BP 118/64 | HR 70 | Temp 97.0°F | Resp 17

## 2023-11-21 DIAGNOSIS — N184 Chronic kidney disease, stage 4 (severe): Secondary | ICD-10-CM | POA: Diagnosis present

## 2023-11-21 LAB — IRON AND TIBC
Iron: 54 ug/dL (ref 28–170)
Saturation Ratios: 17 % (ref 10.4–31.8)
TIBC: 312 ug/dL (ref 250–450)
UIBC: 258 ug/dL

## 2023-11-21 LAB — RENAL FUNCTION PANEL
Albumin: 3.7 g/dL (ref 3.5–5.0)
Anion gap: 8 (ref 5–15)
BUN: 87 mg/dL — ABNORMAL HIGH (ref 6–20)
CO2: 21 mmol/L — ABNORMAL LOW (ref 22–32)
Calcium: 9.5 mg/dL (ref 8.9–10.3)
Chloride: 109 mmol/L (ref 98–111)
Creatinine, Ser: 4.73 mg/dL — ABNORMAL HIGH (ref 0.44–1.00)
GFR, Estimated: 11 mL/min — ABNORMAL LOW (ref >60)
Glucose, Bld: 123 mg/dL — ABNORMAL HIGH (ref 70–99)
Phosphorus: 5.1 mg/dL — ABNORMAL HIGH (ref 2.5–4.6)
Potassium: 4.1 mmol/L (ref 3.5–5.1)
Sodium: 138 mmol/L (ref 135–145)

## 2023-11-21 LAB — POCT HEMOGLOBIN-HEMACUE: Hemoglobin: 9.6 g/dL — ABNORMAL LOW (ref 12.0–15.0)

## 2023-11-21 LAB — FERRITIN: Ferritin: 131 ng/mL (ref 11–307)

## 2023-11-21 MED ORDER — EPOETIN ALFA-EPBX 40000 UNIT/ML IJ SOLN
40000.0000 [IU] | INTRAMUSCULAR | Status: DC
  Administered 2023-11-21: 40000 [IU] via SUBCUTANEOUS

## 2023-11-21 MED ORDER — EPOETIN ALFA-EPBX 40000 UNIT/ML IJ SOLN
INTRAMUSCULAR | Status: AC
  Filled 2023-11-21: qty 1

## 2023-12-03 ENCOUNTER — Telehealth: Payer: Self-pay | Admitting: Pharmacy Technician

## 2023-12-03 NOTE — Telephone Encounter (Signed)
 Auth Submission: NO AUTH NEEDED Site of care: Site of care: [facility] Payer: [employer] DUAL Medication & CPT/J Code(s) submitted:  RETACRIT  Q5106 Diagnosis Code:  Route of submission (phone, fax, portal): PORTAL Phone # Fax # Auth type: Buy/Bill HB Units/visits requested: 20,000u q2wks Reference number: [id number] Approval from: 12/03/23 to 12/02/24

## 2023-12-04 ENCOUNTER — Encounter (HOSPITAL_COMMUNITY)
Admission: RE | Admit: 2023-12-04 | Discharge: 2023-12-04 | Disposition: A | Discharge status: Home / Self Care | Source: Ambulatory Visit | Attending: Internal Medicine | Admitting: Internal Medicine

## 2023-12-04 VITALS — BP 110/52 | HR 71 | Temp 97.0°F | Resp 17

## 2023-12-04 DIAGNOSIS — N184 Chronic kidney disease, stage 4 (severe): Secondary | ICD-10-CM

## 2023-12-04 LAB — POCT HEMOGLOBIN-HEMACUE: Hemoglobin: 9.2 g/dL — ABNORMAL LOW (ref 12.0–15.0)

## 2023-12-04 MED ORDER — EPOETIN ALFA-EPBX 40000 UNIT/ML IJ SOLN
40000.0000 [IU] | INTRAMUSCULAR | Status: DC
  Administered 2023-12-04: 40000 [IU] via SUBCUTANEOUS

## 2023-12-04 MED ORDER — EPOETIN ALFA-EPBX 40000 UNIT/ML IJ SOLN
INTRAMUSCULAR | Status: AC
  Filled 2023-12-04: qty 1

## 2023-12-04 MED ORDER — EPOETIN ALFA-EPBX 10000 UNIT/ML IJ SOLN: 40000.0000 [IU] | INTRAMUSCULAR | Status: DC

## 2023-12-18 ENCOUNTER — Encounter (HOSPITAL_COMMUNITY)
Admission: RE | Admit: 2023-12-18 | Discharge: 2023-12-18 | Disposition: A | Discharge status: Home / Self Care | Source: Ambulatory Visit | Attending: Internal Medicine | Admitting: Internal Medicine

## 2023-12-18 VITALS — BP 113/55 | HR 70 | Temp 97.7°F | Resp 17

## 2023-12-18 DIAGNOSIS — N184 Chronic kidney disease, stage 4 (severe): Secondary | ICD-10-CM | POA: Diagnosis present

## 2023-12-18 LAB — RENAL FUNCTION PANEL
Albumin: 3.8 g/dL (ref 3.5–5.0)
Anion gap: 12 (ref 5–15)
BUN: 82 mg/dL — ABNORMAL HIGH (ref 6–20)
CO2: 19 mmol/L — ABNORMAL LOW (ref 22–32)
Calcium: 9.9 mg/dL (ref 8.9–10.3)
Chloride: 107 mmol/L (ref 98–111)
Creatinine, Ser: 5.1 mg/dL — ABNORMAL HIGH (ref 0.44–1.00)
GFR, Estimated: 10 mL/min — ABNORMAL LOW (ref >60)
Glucose, Bld: 97 mg/dL (ref 70–99)
Phosphorus: 5.8 mg/dL — ABNORMAL HIGH (ref 2.5–4.6)
Potassium: 5.1 mmol/L (ref 3.5–5.1)
Sodium: 138 mmol/L (ref 135–145)

## 2023-12-18 LAB — FERRITIN: Ferritin: 123 ng/mL (ref 11–307)

## 2023-12-18 LAB — POCT HEMOGLOBIN-HEMACUE: Hemoglobin: 10.1 g/dL — ABNORMAL LOW (ref 12.0–15.0)

## 2023-12-18 LAB — IRON AND TIBC
Iron: 44 ug/dL (ref 28–170)
Saturation Ratios: 16 % (ref 10.4–31.8)
TIBC: 280 ug/dL (ref 250–450)
UIBC: 236 ug/dL

## 2023-12-18 MED ORDER — EPOETIN ALFA-EPBX 40000 UNIT/ML IJ SOLN
40000.0000 [IU] | INTRAMUSCULAR | Status: DC
  Administered 2023-12-18: 40000 [IU] via SUBCUTANEOUS

## 2023-12-18 MED ORDER — EPOETIN ALFA-EPBX 40000 UNIT/ML IJ SOLN
INTRAMUSCULAR | Status: AC
  Filled 2023-12-18: qty 1

## 2023-12-19 LAB — PTH, INTACT AND CALCIUM
Calcium, Total (PTH): 9.4 mg/dL (ref 8.7–10.2)
PTH: 83 pg/mL — ABNORMAL HIGH (ref 15–65)

## 2024-01-01 ENCOUNTER — Encounter (HOSPITAL_COMMUNITY)
Admission: RE | Admit: 2024-01-01 | Discharge: 2024-01-01 | Disposition: A | Discharge status: Home / Self Care | Source: Ambulatory Visit | Attending: Internal Medicine | Admitting: Internal Medicine

## 2024-01-01 VITALS — BP 138/70 | HR 76 | Temp 97.6°F | Resp 16

## 2024-01-01 DIAGNOSIS — N184 Chronic kidney disease, stage 4 (severe): Secondary | ICD-10-CM

## 2024-01-01 LAB — POCT HEMOGLOBIN-HEMACUE: Hemoglobin: 10.1 g/dL — ABNORMAL LOW (ref 12.0–15.0)

## 2024-01-01 MED ORDER — EPOETIN ALFA-EPBX 10000 UNIT/ML IJ SOLN
40000.0000 [IU] | INTRAMUSCULAR | Status: DC
  Administered 2024-01-01: 40000 [IU] via SUBCUTANEOUS

## 2024-01-01 MED ORDER — EPOETIN ALFA-EPBX 40000 UNIT/ML IJ SOLN
INTRAMUSCULAR | Status: AC
  Filled 2024-01-01: qty 1

## 2024-01-15 ENCOUNTER — Encounter (HOSPITAL_COMMUNITY)

## 2024-01-29 ENCOUNTER — Encounter (HOSPITAL_COMMUNITY)

## 2024-01-31 ENCOUNTER — Encounter (HOSPITAL_COMMUNITY)
Admission: RE | Admit: 2024-01-31 | Discharge: 2024-01-31 | Disposition: A | Discharge status: Home / Self Care | Source: Ambulatory Visit | Attending: Internal Medicine | Admitting: Internal Medicine

## 2024-01-31 VITALS — BP 120/62 | HR 70 | Temp 97.6°F | Resp 16 | Wt 123.1 lb

## 2024-01-31 DIAGNOSIS — D631 Anemia in chronic kidney disease: Secondary | ICD-10-CM | POA: Insufficient documentation

## 2024-01-31 DIAGNOSIS — N184 Chronic kidney disease, stage 4 (severe): Secondary | ICD-10-CM | POA: Insufficient documentation

## 2024-01-31 LAB — RENAL FUNCTION PANEL
Albumin: 4.1 g/dL (ref 3.5–5.0)
Anion gap: 9 (ref 5–15)
BUN: 97 mg/dL — ABNORMAL HIGH (ref 6–20)
CO2: 22 mmol/L (ref 22–32)
Calcium: 9.5 mg/dL (ref 8.9–10.3)
Chloride: 105 mmol/L (ref 98–111)
Creatinine, Ser: 5.44 mg/dL — ABNORMAL HIGH (ref 0.44–1.00)
GFR, Estimated: 9 mL/min — ABNORMAL LOW (ref >60)
Glucose, Bld: 107 mg/dL — ABNORMAL HIGH (ref 70–99)
Phosphorus: 7 mg/dL — ABNORMAL HIGH (ref 2.5–4.6)
Potassium: 4.8 mmol/L (ref 3.5–5.1)
Sodium: 136 mmol/L (ref 135–145)

## 2024-01-31 LAB — IRON AND TIBC
Iron: 53 ug/dL (ref 28–170)
Saturation Ratios: 16 % (ref 10.4–31.8)
TIBC: 326 ug/dL (ref 250–450)
UIBC: 273 ug/dL

## 2024-01-31 LAB — FERRITIN: Ferritin: 146 ng/mL (ref 11–307)

## 2024-01-31 LAB — POCT HEMOGLOBIN-HEMACUE: Hemoglobin: 9.2 g/dL — ABNORMAL LOW (ref 12.0–15.0)

## 2024-01-31 MED ORDER — EPOETIN ALFA-EPBX 40000 UNIT/ML IJ SOLN
INTRAMUSCULAR | Status: AC
  Filled 2024-01-31: qty 1

## 2024-01-31 MED ORDER — EPOETIN ALFA-EPBX 40000 UNIT/ML IJ SOLN
40000.0000 [IU] | INTRAMUSCULAR | Status: DC
  Administered 2024-01-31: 40000 [IU] via SUBCUTANEOUS

## 2024-01-31 MED ORDER — EPOETIN ALFA-EPBX 10000 UNIT/ML IJ SOLN: 40000.0000 [IU] | INTRAMUSCULAR | Status: DC

## 2024-02-04 ENCOUNTER — Telehealth (HOSPITAL_COMMUNITY): Payer: Self-pay | Admitting: Pharmacy Technician

## 2024-02-04 NOTE — Telephone Encounter (Signed)
 Auth Submission: NO AUTH NEEDED Site of care: [facility] Payer: [employer] Medicare, [place] Medicaid Medication & CPT/J Code(s) submitted: Feraheme (ferumoxytol ) [id number] Diagnosis Code: D63.1 Route of submission (phone, fax, portal): portal Phone # Fax # Auth type: Buy/Bill HB Units/visits requested: 510mg  x 2 doses Reference number: [id number] Approval from: 02/04/24 to 06/05/24      Dagoberto Armour, CPhT [facility] Phone: [phone] 02/04/2024

## 2024-02-13 ENCOUNTER — Other Ambulatory Visit (HOSPITAL_COMMUNITY): Payer: Self-pay

## 2024-02-14 ENCOUNTER — Encounter (HOSPITAL_COMMUNITY)
Admission: RE | Admit: 2024-02-14 | Discharge: 2024-02-14 | Disposition: A | Discharge status: Home / Self Care | Source: Ambulatory Visit | Attending: Internal Medicine | Admitting: Internal Medicine

## 2024-02-14 VITALS — BP 109/55 | HR 71 | Temp 97.6°F | Resp 16

## 2024-02-14 DIAGNOSIS — N184 Chronic kidney disease, stage 4 (severe): Secondary | ICD-10-CM

## 2024-02-14 MED ORDER — EPOETIN ALFA-EPBX 20000 UNIT/ML IJ SOLN: 60000.0000 [IU] | INTRAMUSCULAR | Status: DC

## 2024-02-14 MED ORDER — EPOETIN ALFA-EPBX 40000 UNIT/ML IJ SOLN
60000.0000 [IU] | INTRAMUSCULAR | Status: DC
  Administered 2024-02-14: 60000 [IU] via SUBCUTANEOUS

## 2024-02-14 MED ORDER — EPOETIN ALFA-EPBX 40000 UNIT/ML IJ SOLN: 60000.0000 [IU] | INTRAMUSCULAR | Status: DC

## 2024-02-18 LAB — POCT HEMOGLOBIN-HEMACUE: Hemoglobin: 8.8 g/dL — ABNORMAL LOW (ref 12.0–15.0)

## 2024-02-25 DIAGNOSIS — D509 Iron deficiency anemia, unspecified: Secondary | ICD-10-CM | POA: Insufficient documentation

## 2024-02-25 DIAGNOSIS — D631 Anemia in chronic kidney disease: Secondary | ICD-10-CM | POA: Insufficient documentation

## 2024-02-28 ENCOUNTER — Inpatient Hospital Stay (HOSPITAL_COMMUNITY): Admission: RE | Admit: 2024-02-28 | Source: Ambulatory Visit

## 2024-03-02 ENCOUNTER — Inpatient Hospital Stay (HOSPITAL_COMMUNITY): Admission: RE | Admit: 2024-03-02 | Source: Ambulatory Visit

## 2024-03-02 ENCOUNTER — Encounter (HOSPITAL_COMMUNITY)

## 2024-03-02 DIAGNOSIS — D508 Other iron deficiency anemias: Secondary | ICD-10-CM

## 2024-03-02 DIAGNOSIS — D631 Anemia in chronic kidney disease: Secondary | ICD-10-CM

## 2024-03-16 ENCOUNTER — Ambulatory Visit (HOSPITAL_COMMUNITY)

## 2024-03-16 ENCOUNTER — Ambulatory Visit (HOSPITAL_COMMUNITY)
Admission: RE | Admit: 2024-03-16 | Discharge: 2024-03-16 | Disposition: A | Discharge status: Home / Self Care | Source: Ambulatory Visit | Attending: Internal Medicine | Admitting: Internal Medicine

## 2024-03-16 ENCOUNTER — Encounter (HOSPITAL_COMMUNITY)

## 2024-03-16 VITALS — BP 116/58 | HR 65 | Temp 97.5°F | Resp 16

## 2024-03-16 DIAGNOSIS — D631 Anemia in chronic kidney disease: Secondary | ICD-10-CM | POA: Insufficient documentation

## 2024-03-16 DIAGNOSIS — D508 Other iron deficiency anemias: Secondary | ICD-10-CM | POA: Diagnosis present

## 2024-03-16 DIAGNOSIS — N185 Chronic kidney disease, stage 5: Secondary | ICD-10-CM | POA: Diagnosis present

## 2024-03-16 LAB — POCT HEMOGLOBIN-HEMACUE: Hemoglobin: 10.6 g/dL — ABNORMAL LOW (ref 12.0–15.0)

## 2024-03-16 MED ORDER — EPOETIN ALFA-EPBX 20000 UNIT/ML IJ SOLN
20000.0000 [IU] | Freq: Once | INTRAMUSCULAR | Status: AC
  Administered 2024-03-16: 20000 [IU] via SUBCUTANEOUS

## 2024-03-16 MED ORDER — SODIUM CHLORIDE 0.9 % IV SOLN
510.0000 mg | Freq: Once | INTRAVENOUS | Status: AC
  Administered 2024-03-16: 510 mg via INTRAVENOUS
  Filled 2024-03-16: qty 510

## 2024-03-16 MED ORDER — EPOETIN ALFA-EPBX 40000 UNIT/ML IJ SOLN
INTRAMUSCULAR | Status: AC
  Filled 2024-03-16: qty 1

## 2024-03-16 MED ORDER — EPOETIN ALFA-EPBX 20000 UNIT/ML IJ SOLN
INTRAMUSCULAR | Status: AC
  Filled 2024-03-16: qty 1

## 2024-03-16 MED ORDER — EPOETIN ALFA-EPBX 40000 UNIT/ML IJ SOLN
40000.0000 [IU] | Freq: Once | INTRAMUSCULAR | Status: AC
  Administered 2024-03-16: 40000 [IU] via SUBCUTANEOUS

## 2024-03-30 ENCOUNTER — Inpatient Hospital Stay (HOSPITAL_COMMUNITY): Admission: RE | Admit: 2024-03-30 | Source: Ambulatory Visit

## 2024-03-30 ENCOUNTER — Encounter (HOSPITAL_COMMUNITY)

## 2024-04-07 ENCOUNTER — Encounter (HOSPITAL_COMMUNITY)
Admission: RE | Admit: 2024-04-07 | Discharge: 2024-04-07 | Disposition: A | Discharge status: Home / Self Care | Source: Ambulatory Visit | Attending: Internal Medicine | Admitting: Internal Medicine

## 2024-04-07 VITALS — BP 131/70 | HR 66 | Temp 97.7°F | Resp 16

## 2024-04-07 DIAGNOSIS — N185 Chronic kidney disease, stage 5: Secondary | ICD-10-CM | POA: Insufficient documentation

## 2024-04-07 DIAGNOSIS — D508 Other iron deficiency anemias: Secondary | ICD-10-CM | POA: Insufficient documentation

## 2024-04-07 DIAGNOSIS — D631 Anemia in chronic kidney disease: Secondary | ICD-10-CM | POA: Insufficient documentation

## 2024-04-07 LAB — POCT HEMOGLOBIN-HEMACUE: Hemoglobin: 9.9 g/dL — ABNORMAL LOW (ref 12.0–15.0)

## 2024-04-07 MED ORDER — EPOETIN ALFA-EPBX 40000 UNIT/ML IJ SOLN
INTRAMUSCULAR | Status: AC
  Filled 2024-04-07: qty 1

## 2024-04-07 MED ORDER — SODIUM CHLORIDE 0.9 % IV SOLN
510.0000 mg | Freq: Once | INTRAVENOUS | Status: AC
  Administered 2024-04-07: 510 mg via INTRAVENOUS
  Filled 2024-04-07: qty 510

## 2024-04-07 MED ORDER — EPOETIN ALFA-EPBX 20000 UNIT/ML IJ SOLN
INTRAMUSCULAR | Status: AC
  Filled 2024-04-07: qty 1

## 2024-04-07 MED ORDER — EPOETIN ALFA-EPBX 20000 UNIT/ML IJ SOLN
20000.0000 [IU] | Freq: Once | INTRAMUSCULAR | Status: AC
  Administered 2024-04-07: 20000 [IU] via SUBCUTANEOUS

## 2024-04-07 MED ORDER — EPOETIN ALFA-EPBX 40000 UNIT/ML IJ SOLN
40000.0000 [IU] | Freq: Once | INTRAMUSCULAR | Status: AC
  Administered 2024-04-07: 40000 [IU] via SUBCUTANEOUS

## 2024-04-13 ENCOUNTER — Encounter (HOSPITAL_COMMUNITY)

## 2024-04-18 ENCOUNTER — Emergency Department (HOSPITAL_COMMUNITY)
Admission: EM | Admit: 2024-04-18 | Discharge: 2024-04-19 | Disposition: A | Discharge status: Home / Self Care | Attending: Emergency Medicine | Admitting: Emergency Medicine

## 2024-04-18 ENCOUNTER — Encounter (HOSPITAL_COMMUNITY): Payer: Self-pay

## 2024-04-18 ENCOUNTER — Other Ambulatory Visit: Payer: Self-pay

## 2024-04-18 ENCOUNTER — Emergency Department (HOSPITAL_COMMUNITY)

## 2024-04-18 DIAGNOSIS — M25532 Pain in left wrist: Secondary | ICD-10-CM | POA: Diagnosis present

## 2024-04-18 DIAGNOSIS — M7989 Other specified soft tissue disorders: Secondary | ICD-10-CM | POA: Diagnosis not present

## 2024-04-18 DIAGNOSIS — R569 Unspecified convulsions: Secondary | ICD-10-CM | POA: Insufficient documentation

## 2024-04-18 DIAGNOSIS — N186 End stage renal disease: Secondary | ICD-10-CM | POA: Diagnosis not present

## 2024-04-18 MED ORDER — OXYCODONE HCL 5 MG PO TABS
5.0000 mg | ORAL_TABLET | ORAL | Status: DC | PRN
  Administered 2024-04-18: 5 mg via ORAL
  Filled 2024-04-18: qty 1

## 2024-04-18 MED ORDER — LEVETIRACETAM 500 MG PO TABS
1000.0000 mg | ORAL_TABLET | Freq: Once | ORAL | Status: AC
  Administered 2024-04-18: 1000 mg via ORAL
  Filled 2024-04-18: qty 2

## 2024-04-18 MED ORDER — ACETAMINOPHEN 500 MG PO TABS
1000.0000 mg | ORAL_TABLET | Freq: Once | ORAL | Status: AC
Start: 2024-04-18 — End: 2024-04-18
  Administered 2024-04-18: 1000 mg via ORAL
  Filled 2024-04-18: qty 2

## 2024-04-18 NOTE — ED Triage Notes (Signed)
 Pt arrives for complaints of left wrist pain that she reports is possibly gout. While in triage room pt had seizure like activity that lasted approx 30 sec. Pt was jerking and drooling. After approx 30 sec pt was able to follow commands and hold conversation. Pt reports one seizure in the past after a procedure at the hospital. Pt was placed on seizure medications but has been off of them for about a month due to having no more refills. Pt alert and oriented x4

## 2024-04-18 NOTE — ED Provider Notes (Signed)
 [place] EMERGENCY DEPARTMENT AT [facility] Provider Note   CSN: [id number] Arrival date & time: 04/18/24  2232     History Chief Complaint  Patient presents with   Wrist Pain   Seizures    HPI Joanna [name] is a 53 y.o. female presenting for chief complaint of substantial left wrist pain.  States that her left arm is been swelling up and giving her pain over the last 72 hours.  Did not want to come in.  Cannot think of any injury.  Denies fevers chills nausea vomiting syncope or shortness of breath.  No medications prior to arrival.  History of ESRD on dialysis.  Her fistula is proximal to the left wrist swelling. Very tender all throughout her left wrist.   Patient's recorded medical, surgical, social, medication list and allergies were reviewed in the Snapshot window as part of the initial history.   Review of Systems   Review of Systems  Constitutional:  Negative for chills and fever.  HENT:  Negative for ear pain and sore throat.   Eyes:  Negative for pain and visual disturbance.  Respiratory:  Negative for cough and shortness of breath.   Cardiovascular:  Negative for chest pain and palpitations.  Gastrointestinal:  Negative for abdominal pain and vomiting.  Genitourinary:  Negative for dysuria and hematuria.  Musculoskeletal:  Negative for arthralgias and back pain.  Skin:  Negative for color change and rash.  Neurological:  Negative for seizures and syncope.  All other systems reviewed and are negative.   Physical Exam Updated Vital Signs BP (!) 146/90   Pulse 85   Temp 97.8 F (36.6 C) (Oral)   Resp 16   SpO2 100%  Physical Exam Vitals and nursing note reviewed.  Constitutional:      General: She is not in acute distress.    Appearance: She is well-developed.  HENT:     Head: Normocephalic and atraumatic.  Eyes:     Conjunctiva/sclera: Conjunctivae normal.  Cardiovascular:     Rate and Rhythm: Normal rate and regular rhythm.     Heart sounds:  No murmur heard. Pulmonary:     Effort: Pulmonary effort is normal. No respiratory distress.     Breath sounds: Normal breath sounds.  Abdominal:     General: There is no distension.     Palpations: Abdomen is soft.     Tenderness: There is no abdominal tenderness. There is no right CVA tenderness or left CVA tenderness.  Musculoskeletal:        General: Swelling and tenderness present. No deformity. Normal range of motion.     Cervical back: Neck supple.     Comments: Substantial left arm swelling proximal to the left wrist and all throughout the left hand.   Skin:    General: Skin is warm and dry.  Neurological:     General: No focal deficit present.     Mental Status: She is alert and oriented to person, place, and time. Mental status is at baseline.     Cranial Nerves: No cranial nerve deficit.      ED Course/ Medical Decision Making/ A&P Clinical Course as of 04/19/24 9348  [facility] Apr 19, 2024  0046 Acute Gout Diagnosis Rule  RESULT SUMMARY: 8.5 points 82.5% prevalence of gout in original study.  Gout is very likely.   INPUTS: Female sex -> 0 = No Previous patient-reported arthritis attack -> 2 = Yes Onset within 1 day -> 0.5 = Yes Joint redness ->  1 = Yes 1st metatarsophalangeal joint involvement -> 0 = No Hypertension or >=1 cardiac diseases -> 1.5 = Yes Serum uric acid > 5.88 mg/dL (9.64 mmol/L) -> 3.5 = Yes   [CC]    Clinical Course User Index [CC] Jerral Meth, MD    Procedures Procedures   Medications Ordered in ED Medications  oxyCODONE  (Oxy IR/ROXICODONE ) immediate release tablet 5 mg (5 mg Oral Given 04/18/24 2326)  levETIRAcetam  (KEPPRA ) tablet 1,000 mg (1,000 mg Oral Given 04/18/24 2312)  acetaminophen  (TYLENOL ) tablet 1,000 mg (1,000 mg Oral Given 04/18/24 2326)  predniSONE (DELTASONE) tablet 40 mg (40 mg Oral Given 04/19/24 0308)    Medical Decision Making:   53 year old female presenting with a chief complaint of left wrist pain and  swelling.  No acute distress.  Ambulatory tolerating p.o. intake. Some redness and swelling from the left wrist all the way down to the left thumb.  Substantial soft tissue swelling.  This is new over the past week.  Worsening in this time.  She states she thinks it is her gout but is uncertain.  She does have a left arm AV fistula. No known sick contacts. Lab work performed showing elevated uric acid level consistent with likely gout episode.  See ED course for calculation of gout risk. However DVT would be in the differential, graft failure would be in the differential given duration of symptoms.  She has improved with steroid and pain control.  Discussed arthrocentesis for evaluation for septic arthritis and patient declined.  Stated that she would [name] treat as gout and reassess if Clinically worsening. DVT study ordered for a.m.  Patient in no acute distress, medication sent to pharmacy for management of likely gout flare pending negative DVT/fistula study in the a.m.  Clinical Impression:  1. Left wrist pain   2. Seizure [facility])      Data Unavailable   Final Clinical Impression(s) / ED Diagnoses Final diagnoses:  Left wrist pain  Seizure (HCC)    Rx / DC Orders ED Discharge Orders          Ordered    levETIRAcetam  (KEPPRA ) 500 MG tablet  2 times daily        04/19/24 0405    predniSONE (DELTASONE) 20 MG tablet  Daily        04/19/24 0405    oxyCODONE -acetaminophen  (PERCOCET/ROXICET) 5-325 MG tablet  Every 6 hours PRN        04/19/24 0405              Pharell Rolfson, MD 04/19/24 [phone]

## 2024-04-18 NOTE — ED Provider Notes (Incomplete)
  [place] EMERGENCY DEPARTMENT AT [facility] Provider Note   CSN: [id number] Arrival date & time: 04/18/24  2232     History Chief Complaint  Patient presents with  . Wrist Pain  . Seizures    HPI Joanna [name] is a 53 y.o. female presenting for ***.   Patient's recorded medical, surgical, social, medication list and allergies were reviewed in the Snapshot window as part of the initial history.   Review of Systems   Review of Systems  Physical Exam Updated Vital Signs BP (!) 146/80 (BP Location: Right Arm)   Pulse 90   Temp 98.8 F (37.1 C) (Oral)   Resp 18   SpO2 97%  Physical Exam   ED Course/ Medical Decision Making/ A&P    Procedures Procedures   Medications Ordered in ED Medications - No data to display  Medical Decision Making:   Joanna [name] is a 53 y.o. female who presented to the ED today with *** detailed above.    {crccomplexity:27900} Complete initial physical exam performed, notably the patient  was ***.    Reviewed and confirmed nursing documentation for past medical history, family history, social history.    Initial Assessment:   With the patient's presentation of ***, most likely diagnosis is ***. Other diagnoses were considered including (but not limited to) ***. These are considered less likely due to history of present illness and physical exam findings.   {crccopa:27899}  Initial Plan:  ***  ***Screening labs including CBC and Metabolic panel to evaluate for infectious or metabolic etiology of disease.  ***Urinalysis with reflex culture ordered to evaluate for UTI or relevant urologic/nephrologic pathology.  ***CXR to evaluate for structural/infectious intrathoracic pathology.  {crccardiactesting:32591::EKG to evaluate for cardiac pathology} Objective evaluation as below reviewed   Initial Study Results:   Laboratory  All laboratory results reviewed without evidence of clinically relevant pathology.   ***Exceptions include:  ***   ***EKG EKG was reviewed independently. Rate, rhythm, axis, intervals all examined and without medically relevant abnormality. ST segments without concerns for elevations.    Radiology:  All images reviewed independently. ***Agree with radiology report at this time.   No results found.    Consults: Case discussed with ***.   Reassessment and Plan:   ***    ***  Clinical Impression: No diagnosis found.   Data Unavailable   Final Clinical Impression(s) / ED Diagnoses Final diagnoses:  None    Rx / DC Orders ED Discharge Orders     None

## 2024-04-19 ENCOUNTER — Emergency Department (HOSPITAL_COMMUNITY)

## 2024-04-19 DIAGNOSIS — M7989 Other specified soft tissue disorders: Secondary | ICD-10-CM | POA: Diagnosis not present

## 2024-04-19 DIAGNOSIS — N186 End stage renal disease: Secondary | ICD-10-CM | POA: Diagnosis not present

## 2024-04-19 LAB — CBC WITH DIFFERENTIAL/PLATELET
Abs Immature Granulocytes: 0.03 K/uL (ref 0.00–0.07)
Basophils Absolute: 0 K/uL (ref 0.0–0.1)
Basophils Relative: 1 %
Eosinophils Absolute: 0.1 K/uL (ref 0.0–0.5)
Eosinophils Relative: 1 %
HCT: 31.8 % — ABNORMAL LOW (ref 36.0–46.0)
Hemoglobin: 9.8 g/dL — ABNORMAL LOW (ref 12.0–15.0)
Immature Granulocytes: 0 %
Lymphocytes Relative: 5 %
Lymphs Abs: 0.5 K/uL — ABNORMAL LOW (ref 0.7–4.0)
MCH: 29.2 pg (ref 26.0–34.0)
MCHC: 30.8 g/dL (ref 30.0–36.0)
MCV: 94.6 fL (ref 80.0–100.0)
Monocytes Absolute: 1.1 K/uL — ABNORMAL HIGH (ref 0.1–1.0)
Monocytes Relative: 12 %
Neutro Abs: 7 K/uL (ref 1.7–7.7)
Neutrophils Relative %: 81 %
Platelets: 274 K/uL (ref 150–400)
RBC: 3.36 MIL/uL — ABNORMAL LOW (ref 3.87–5.11)
RDW: 16.7 % — ABNORMAL HIGH (ref 11.5–15.5)
WBC: 8.7 K/uL (ref 4.0–10.5)
nRBC: 0 % (ref 0.0–0.2)

## 2024-04-19 LAB — URIC ACID: Uric Acid, Serum: 8.7 mg/dL — ABNORMAL HIGH (ref 2.5–7.1)

## 2024-04-19 LAB — COMPREHENSIVE METABOLIC PANEL WITH GFR
ALT: 10 U/L (ref 0–44)
AST: 17 U/L (ref 15–41)
Albumin: 3.9 g/dL (ref 3.5–5.0)
Alkaline Phosphatase: 43 U/L (ref 38–126)
Anion gap: 13 (ref 5–15)
BUN: 84 mg/dL — ABNORMAL HIGH (ref 6–20)
CO2: 20 mmol/L — ABNORMAL LOW (ref 22–32)
Calcium: 9.2 mg/dL (ref 8.9–10.3)
Chloride: 102 mmol/L (ref 98–111)
Creatinine, Ser: 5.09 mg/dL — ABNORMAL HIGH (ref 0.44–1.00)
GFR, Estimated: 10 mL/min — ABNORMAL LOW (ref >60)
Glucose, Bld: 107 mg/dL — ABNORMAL HIGH (ref 70–99)
Potassium: 5.1 mmol/L (ref 3.5–5.1)
Sodium: 135 mmol/L (ref 135–145)
Total Bilirubin: 0.4 mg/dL (ref 0.0–1.2)
Total Protein: 7.1 g/dL (ref 6.5–8.1)

## 2024-04-19 MED ORDER — PREDNISONE 20 MG PO TABS: 20.0000 mg | ORAL_TABLET | Freq: Every day | ORAL | 0 refills | Status: AC

## 2024-04-19 MED ORDER — OXYCODONE-ACETAMINOPHEN 5-325 MG PO TABS: 1.0000 | ORAL_TABLET | Freq: Four times a day (QID) | ORAL | 0 refills | Status: AC | PRN

## 2024-04-19 MED ORDER — LEVETIRACETAM 500 MG PO TABS
500.0000 mg | ORAL_TABLET | Freq: Two times a day (BID) | ORAL | 0 refills | Status: AC
Start: 2024-04-19 — End: ?

## 2024-04-19 MED ORDER — PREDNISONE 20 MG PO TABS
40.0000 mg | ORAL_TABLET | Freq: Once | ORAL | Status: AC
  Administered 2024-04-19: 40 mg via ORAL
  Filled 2024-04-19: qty 2

## 2024-04-19 NOTE — Discharge Instructions (Addendum)
 Joanna [name]  Thank you for allowing us  to take care of you today.  You came to the Emergency Department today because of pain and swelling in your left arm and breast.  Here in the emergency department your symptoms are concerning for gout.  We offered to take some fluid off of your wrist and tested, however you do not want this to be done, which is reasonable.  We are sending medications for gout into your pharmacy.  We also got a ultrasound to look for any complications with your fistula or any blood clots.  There is no evidence of any blood clots.  Part of your fistula is a little bit more narrow than it used to be, therefore you do need to follow-up with your vascular surgeon (Dr. Pearline) to make sure that this does not worsen, it is possible that you may need a procedure on it in the future, however he did not need any procedure around now.  We are also refilling your Keppra , and you should take this as prescribed.  To-Do: 1. Please follow-up with your primary doctor within 1 - 2 weeks / as soon as possible.   Please return to the Emergency Department or call 911 if you experience have worsening of your symptoms, or do not get better, chest pain, shortness of breath, severe or significantly worsening pain, high fever, severe confusion, pass out or have any reason to think that you need emergency medical care.   We hope you feel better soon.   Mitzie Later, MD Department of Emergency Medicine [facility] [place]

## 2024-04-19 NOTE — ED Notes (Addendum)
 RN gave patient food and fluids, she tolerated well. Per her request RN calle pateint nephew Lynwood for transportation. Unaboe to make contact, RN called patient sister and was able to make contact. She is on her way to pick patient up.

## 2024-04-19 NOTE — Progress Notes (Signed)
 LUE venous duplex and dialysis duplex have been completed.  Preliminary results given to Dr. Rogelia.   Results can be found under chart review under CV PROC. 04/19/2024 11:49 AM Avea Mcgowen RVT, RDMS

## 2024-04-19 NOTE — ED Provider Notes (Signed)
  [place] EMERGENCY DEPARTMENT AT [facility] Provider Assume Care Note I assumed care of Joanna [name] on 04/19/2024 at 7 AM from Dr. Jerral.   Briefly, Joanna [name] is a 53 y.o. female who: PMHx: Seizure disorder on Keppra , ESRD on HD with LUE fistula P/w pain and swelling in left wrist with acute on chronic swelling in the left upper extremity, has maximum possible acute gout diagnosis rule, offered arthrocentesis, however patient declined, medication sent to preferred pharmacy Additionally patient had spell of abnormal behavior most consistent with nonepileptic spell in triage, now back to baseline, however has been out of Keppra  for 1 month, therefore Keppra  being refilled While symptoms of left upper extremity are likely due to gout, given swelling, and fistula presence in same extremity, patient does require a fistulogram and DVT ultrasound of the left upper extremity, which will be performed when ultrasound is available at 9 AM   Plan at the time of handoff: Follow-up DVT ultrasound and fistulogram, if reassuring, discharge with therapy for gout and refill on Keppra  (already sent to preferred pharmacy)   Please refer to the original provider's note for additional information regarding the care of Joanna [name].  Reassessment: I personally reassessed the patient: Patient without any acute complaints or additional questions.  Vital Signs:  ED Triage Vitals  Encounter Vitals Group     BP 04/18/24 2237 (!) 146/80     Girls Systolic BP Percentile --      Girls Diastolic BP Percentile --      Boys Systolic BP Percentile --      Boys Diastolic BP Percentile --      Pulse Rate 04/18/24 2237 90     Resp 04/18/24 2237 18     Temp 04/18/24 2237 98.8 F (37.1 C)     Temp Source 04/18/24 2237 Oral     SpO2 04/18/24 2237 97 %     Weight --      Height --      Head Circumference --      Peak Flow --      Pain Score 04/18/24 2244 9     Pain Loc --      Pain Education --       Exclude from Growth Chart --      Hemodynamics:  The patient is hemodynamically stable. Mental Status:  The patient is alert  Additional MDM: Ultrasounds do not reveal DVT, however do reveal mild narrowing of patient's AV fistula, discussed with patient, discussed need for vascular surgery follow-up, referred back to Dr. Pearline who has previously manage patient's vascular surgery needs.  No additional acute events, discharged in stable condition.  Disposition: DISCHARGE: I believe that the patient is safe for discharge home with outpatient follow-up. Patient was informed of all pertinent physical exam, laboratory, and imaging findings including narrowing of her AV fistula that requires vascular surgery follow-up.  Patient's suspected etiology of their symptom presentation was discussed with the patient and all questions were answered. We discussed following up with PCP, vascular surgery, nephrology. I provided thorough ED return precautions. The patient feels safe and comfortable with this plan.   FREDRIK CANDIE Later, MD Emergency Medicine    Later Joanna RAMAN, MD 04/19/24 [phone]

## 2024-04-21 ENCOUNTER — Inpatient Hospital Stay (HOSPITAL_COMMUNITY): Admission: RE | Admit: 2024-04-21 | Source: Ambulatory Visit

## 2024-04-28 ENCOUNTER — Ambulatory Visit: Attending: Vascular Surgery | Admitting: Physician Assistant

## 2024-04-28 VITALS — BP 129/80 | HR 92 | Temp 97.9°F | Wt 193.9 lb

## 2024-04-28 DIAGNOSIS — N184 Chronic kidney disease, stage 4 (severe): Secondary | ICD-10-CM | POA: Diagnosis not present

## 2024-04-28 NOTE — Progress Notes (Signed)
 Office Note   History of Present Illness   Joanna [name] is a 53 y.o. (June 13, 1971) female who presents for evaluation of left hand swelling.  She has a history of left brachiocephalic AV fistula creation on 07/23/2023 by Dr. Pearline.  This fistula was superficialized by Dr. Sheree on 09/17/2023.  She returns to clinic today for evaluation of new onset left hand swelling.  She went to the emergency room about 10 days ago with intermittent left wrist and hand swelling, pain and redness.  It was felt that her symptoms were likely due to her history of gout.  A left upper extremity DVT ultrasound was performed, which was negative.  Duplex of her fistula was also performed to ensure that this was not contributing to her swelling.  She states that her swelling and pain has significantly improved after being placed on a course of steroids.  She denies any pain or swelling in the left upper arm.  She is still not on dialysis yet.  Current Outpatient Medications  Medication Sig Dispense Refill   acetaminophen  (TYLENOL ) 500 MG tablet Take 500-1,000 mg by mouth every 6 (six) hours as needed for mild pain (pain score 1-3) (back pain).     allopurinol  (ZYLOPRIM ) 100 MG tablet Take 50 mg by mouth in the morning.     amLODipine  (NORVASC ) 10 MG tablet Take 1 tablet (10 mg total) by mouth daily. 30 tablet 0   aspirin  EC 81 MG EC tablet Take 1 tablet (81 mg total) by mouth daily. Swallow whole. 30 tablet 11   atorvastatin  (LIPITOR) 40 MG tablet Take 1 tablet (40 mg total) by mouth daily. (Patient taking differently: Take 40 mg by mouth every evening.) 30 tablet 0   calcitRIOL (ROCALTROL) 0.5 MCG capsule Take 0.5 mcg by mouth in the morning.     carvedilol  (COREG ) 12.5 MG tablet Take 1 tablet (12.5 mg total) by mouth 2 (two) times daily with a meal. 60 tablet 0   hydrALAZINE  (APRESOLINE ) 100 MG tablet Take 100 mg by mouth 2 (two) times daily.     isosorbide  mononitrate (IMDUR ) 120 MG 24 hr tablet Take 1 tablet (120 mg  total) by mouth daily. 30 tablet 0   levETIRAcetam  (KEPPRA ) 500 MG tablet Take 1 tablet (500 mg total) by mouth daily. 30 tablet 5   levETIRAcetam  (KEPPRA ) 500 MG tablet Take 1 tablet (500 mg total) by mouth 2 (two) times daily. 60 tablet 0   MOUNJARO 2.5 MG/0.5ML Pen Inject 2.5 mg into the skin every Tuesday.     oxyCODONE -acetaminophen  (PERCOCET/ROXICET) 5-325 MG tablet Take 1 tablet by mouth every 6 (six) hours as needed for severe pain (pain score 7-10). 10 tablet 0   OXYGEN Inhale 2 L into the lungs continuous.     torsemide  (DEMADEX ) 20 MG tablet Take 60 mg by mouth in the morning.     No current facility-administered medications for this visit.    REVIEW OF SYSTEMS (negative unless checked):   Cardiac:  []  Chest pain or chest pressure? []  Shortness of breath upon activity? []  Shortness of breath when lying flat? []  Irregular heart rhythm?  Vascular:  []  Pain in calf, thigh, or hip brought on by walking? []  Pain in feet at night that wakes you up from your sleep? []  Blood clot in your veins? []  Leg swelling?  Pulmonary:  []  Oxygen at home? []  Productive cough? []  Wheezing?  Neurologic:  []  Sudden weakness in arms or legs? []  Sudden numbness in  arms or legs? []  Sudden onset of difficult speaking or slurred speech? []  Temporary loss of vision in one eye? []  Problems with dizziness?  Gastrointestinal:  []  Blood in stool? []  Vomited blood?  Genitourinary:  []  Burning when urinating? []  Blood in urine?  Psychiatric:  []  Major depression  Hematologic:  []  Bleeding problems? []  Problems with blood clotting?  Dermatologic:  []  Rashes or ulcers?  Constitutional:  []  Fever or chills?  Ear/Nose/Throat:  []  Change in hearing? []  Nose bleeds? []  Sore throat?  Musculoskeletal:  []  Back pain? []  Joint pain? []  Muscle pain?   Physical Examination   Vitals:   04/28/24 0854  BP: 129/80  Pulse: 92  Temp: 97.9 F (36.6 C)  TempSrc: Temporal  Weight: 193  lb 14.4 oz (88 kg)   Body mass index is 36.64 kg/m.  General:  WDWN in NAD; vital signs documented above Gait: Not observed HENT: WNL, normocephalic Pulmonary: normal non-labored breathing Cardiac: Regular Abdomen: soft, NT, no masses Skin: without rashes Vascular Exam/Pulses: Palpable left radial pulse Extremities: Mild swelling around the left thumb and distal radius.  Left brachiocephalic fistula with great thrill Musculoskeletal: no muscle wasting or atrophy  Neurologic: A&O X 3;  No focal weakness or paresthesias are detected Psychiatric:  The pt has Normal affect.   Non-invasive Vascular Imaging   Left Arm Access Duplex  (04/19/2024):  +--------------------+----------+-----------------+--------+  AVF                PSV (cm/s)Flow Vol (mL/min)Comments  +--------------------+----------+-----------------+--------+  Native artery inflow   316          1403                 +--------------------+----------+-----------------+--------+  AVF Anastomosis        349                               +--------------------+----------+-----------------+--------+     +---------------+----------+-------------+----------+------------------+  OUTFLOW VEIN   PSV (cm/s)Diameter (cm)Depth (cm)     Describe       +---------------+----------+-------------+----------+------------------+  Subclavian vein   199                                               +---------------+----------+-------------+----------+------------------+  Clavicle         250                                               +---------------+----------+-------------+----------+------------------+  Shoulder          98                                               +---------------+----------+-------------+----------+------------------+  Prox UA           126        0.80                                   +---------------+----------+-------------+----------+------------------+  Mid UA  115        0.90                                   +---------------+----------+-------------+----------+------------------+  Dist UA           753        1.39               1.39 cm to 0.46 cm  +---------------+----------+-------------+----------+------------------+  [facility]          112        1.81                   aneurysmal        Medical Decision Making   Joanna [name] is a 53 y.o. female who presents for evaluation of left wrist swelling and pain  The patient has a history of left brachiocephalic AV fistula creation in February of this year with superficialization in April.  She is currently not on dialysis yet She recently went to the emergency room for new onset left wrist and thumb swelling, pain, and erythema.  Left upper extremity duplex was negative for DVT.  A dialysis duplex was obtained which demonstrated a patent fistula with some stenosis in the distal upper arm.  Ultimately it was felt that her swelling and pain was due to a gout flare, however she was sent to our office for further evaluation of her fistula The patient states that her pain and swelling has gotten significantly better after starting a course of steroids for gout.  She now only has minimal swelling around her thumb.  She denies any swelling in her forearm or upper arm.  She denies any pain in her upper arm. On exam she has a palpable left radial pulse.  Her left brachiocephalic fistula has a great thrill.  There is some pulsatility in the distal upper arm. I have explained to the patient that I do not think her fistula stenosis is contributing to her left hand swelling or pain.  Her fistula does appear patent and ready for future use whenever she needs dialysis.  Whenever she starts dialysis if her fistula has any issues such as prolonged bleeding, malfunctioning, or infiltration, she can return to our clinic to plan for fistulogram She can follow-up with our office as needed   Ahmed Holster  PA-C [facility] Office: [phone]  Clinic MD: Miguel

## 2024-05-01 ENCOUNTER — Ambulatory Visit (HOSPITAL_COMMUNITY)
Admission: RE | Admit: 2024-05-01 | Discharge: 2024-05-01 | Disposition: A | Discharge status: Home / Self Care | Source: Ambulatory Visit | Attending: Internal Medicine | Admitting: Internal Medicine

## 2024-05-01 VITALS — BP 139/72 | HR 94 | Temp 97.2°F | Resp 17

## 2024-05-01 DIAGNOSIS — N185 Chronic kidney disease, stage 5: Secondary | ICD-10-CM | POA: Insufficient documentation

## 2024-05-01 DIAGNOSIS — D631 Anemia in chronic kidney disease: Secondary | ICD-10-CM | POA: Diagnosis not present

## 2024-05-01 LAB — RENAL FUNCTION PANEL
Albumin: 3.8 g/dL (ref 3.5–5.0)
Anion gap: 16 — ABNORMAL HIGH (ref 5–15)
BUN: 91 mg/dL — ABNORMAL HIGH (ref 6–20)
CO2: 22 mmol/L (ref 22–32)
Calcium: 9.9 mg/dL (ref 8.9–10.3)
Chloride: 102 mmol/L (ref 98–111)
Creatinine, Ser: 4.77 mg/dL — ABNORMAL HIGH (ref 0.44–1.00)
GFR, Estimated: 10 mL/min — ABNORMAL LOW (ref >60)
Glucose, Bld: 91 mg/dL (ref 70–99)
Phosphorus: 4.5 mg/dL (ref 2.5–4.6)
Potassium: 3.8 mmol/L (ref 3.5–5.1)
Sodium: 140 mmol/L (ref 135–145)

## 2024-05-01 LAB — FERRITIN: Ferritin: 464 ng/mL — ABNORMAL HIGH (ref 11–307)

## 2024-05-01 LAB — IRON AND TIBC
Iron: 63 ug/dL (ref 28–170)
Saturation Ratios: 27 % (ref 10.4–31.8)
TIBC: 237 ug/dL — ABNORMAL LOW (ref 250–450)
UIBC: 174 ug/dL

## 2024-05-01 LAB — POCT HEMOGLOBIN-HEMACUE: Hemoglobin: 10.6 g/dL — ABNORMAL LOW (ref 12.0–15.0)

## 2024-05-01 MED ORDER — EPOETIN ALFA-EPBX 40000 UNIT/ML IJ SOLN
INTRAMUSCULAR | Status: AC
  Filled 2024-05-01: qty 1

## 2024-05-01 MED ORDER — EPOETIN ALFA-EPBX 20000 UNIT/ML IJ SOLN
20000.0000 [IU] | Freq: Once | INTRAMUSCULAR | Status: AC
  Administered 2024-05-01: 20000 [IU] via SUBCUTANEOUS

## 2024-05-01 MED ORDER — EPOETIN ALFA-EPBX 20000 UNIT/ML IJ SOLN
INTRAMUSCULAR | Status: AC
  Filled 2024-05-01: qty 1

## 2024-05-01 MED ORDER — EPOETIN ALFA-EPBX 40000 UNIT/ML IJ SOLN
40000.0000 [IU] | Freq: Once | INTRAMUSCULAR | Status: AC
  Administered 2024-05-01: 40000 [IU] via SUBCUTANEOUS

## 2024-05-02 LAB — PTH, INTACT AND CALCIUM
Calcium, Total (PTH): 10.1 mg/dL (ref 8.7–10.2)
PTH: 148 pg/mL — ABNORMAL HIGH (ref 15–65)

## 2024-05-05 ENCOUNTER — Encounter (HOSPITAL_COMMUNITY)

## 2024-05-07 ENCOUNTER — Ambulatory Visit: Attending: Internal Medicine | Admitting: Internal Medicine

## 2024-05-07 ENCOUNTER — Encounter: Payer: Self-pay | Admitting: Internal Medicine

## 2024-05-07 VITALS — BP 132/84 | HR 91 | Ht 60.0 in | Wt 191.8 lb

## 2024-05-07 DIAGNOSIS — E119 Type 2 diabetes mellitus without complications: Secondary | ICD-10-CM

## 2024-05-07 DIAGNOSIS — I159 Secondary hypertension, unspecified: Secondary | ICD-10-CM

## 2024-05-07 DIAGNOSIS — I5033 Acute on chronic diastolic (congestive) heart failure: Secondary | ICD-10-CM

## 2024-05-07 DIAGNOSIS — I5030 Unspecified diastolic (congestive) heart failure: Secondary | ICD-10-CM | POA: Diagnosis not present

## 2024-05-07 DIAGNOSIS — N186 End stage renal disease: Secondary | ICD-10-CM | POA: Diagnosis not present

## 2024-05-07 NOTE — Progress Notes (Signed)
 OFFICE NOTE  Chief Complaint:  Reestablish cardiology  Primary Care Physician: Campbell Reynolds, NP  HPI:  Joanna [name] is a 53 y.o. female with a past medial history significant for diastolic heart failure, type 2 diabetes, hypertension, intellectual disability, seizures and morbid obesity along with end-stage renal disease with recent placement of a fistula on hemodialysis.  I first met her in 2021 when she presented to hospital with volume overload and congestive heart failure.  She was hypoxemic with elevated creatinine and BNP of 1300.  Echo showed normal LVEF 60 to 65% with moderate pulmonary hypertension and a small pericardial effusion.  She was diuresed and her medications were adjusted.  Subsequently she did get some cardiology care in [place] where she was living for a while however her relatives there passed away and then she moved back to [place].  She is now considered a new patient.  She presents to reestablish cardiology care today.  She was hospitalized earlier this year with seizures.  She had a fistula placed and is undergoing dialysis.  She denies any symptomatic heart failure.  Her last echo was in 2024 at the time there was concern for stroke.  Echo showed LVEF 50 to 55%, with moderate LVH and grade 3 diastolic dysfunction, RV was moderately reduced with moderately elevated pulmonary artery pressure at 40 mmHg.  There is severe left atrial enlargement and moderate right atrial enlargement.  A small pericardial effusion was noted.  These features could possibly consistent with amyloidosis.  There also was a small PFO noted.  PMHx:  Past Medical History:  Diagnosis Date   Acute diastolic heart failure (HCC) 12/16/2019   Acute respiratory failure with hypoxia (HCC) 12/16/2019   AKI (acute kidney injury) 12/16/2019   Anasarca 12/16/2019   Anemia    CAD (coronary artery disease)    Depression    Diabetes mellitus without complication (HCC)    ESRD (end stage renal  disease) (HCC) 07/23/2023   HTN (hypertension) 12/16/2019   Hypertension    Hypertensive urgency 12/16/2019   Intellectual disability 12/16/2019   Morbid obesity (HCC)    New onset of congestive heart failure (HCC) 12/16/2019   Obesity, Class III, BMI 40-49.9 (morbid obesity) (HCC) 12/16/2019   Oxygen dependent    2L at all times   Sleep apnea    getting fitted for CPAP 4/14   Type 2 diabetes mellitus without complication 12/16/2019    Past Surgical History:  Procedure Laterality Date   AV FISTULA PLACEMENT Left 07/23/2023   Procedure: LEFT ARM BRACHIOCEPHALIC ARTERIOVENOUS (AV) FISTULA CREATION;  Surgeon: Pearline Norman RAMAN, MD;  Location: [facility] OR;  Service: Vascular;  Laterality: Left;   CATARACT EXTRACTION Bilateral    FISTULA SUPERFICIALIZATION Left 09/17/2023   Procedure: FISTULA SUPERFICIALIZATION;  Surgeon: Sheree Penne Bruckner, MD;  Location: [facility] OR;  Service: Vascular;  Laterality: Left;    FAMHx:  Family History  Problem Relation Age of Onset   Heart disease Mother        Enlarged heart per patient   Hypertension Mother    Diabetes Mother    Hypertension Sister    Diabetes Sister     SOCHx:   reports that she has never smoked. She has never used smokeless tobacco. She reports that she does not currently use alcohol. She reports that she does not use drugs.  ALLERGIES:  No Known Allergies  ROS: Pertinent items noted in HPI and remainder of comprehensive ROS otherwise negative.  HOME MEDS: Current Outpatient Medications on  File Prior to Visit  Medication Sig Dispense Refill   acetaminophen  (TYLENOL ) 500 MG tablet Take 500-1,000 mg by mouth every 6 (six) hours as needed for mild pain (pain score 1-3) (back pain).     allopurinol  (ZYLOPRIM ) 100 MG tablet Take 50 mg by mouth in the morning.     amLODipine  (NORVASC ) 10 MG tablet Take 1 tablet (10 mg total) by mouth daily. 30 tablet 0   aspirin  EC 81 MG EC tablet Take 1 tablet (81 mg total) by mouth daily. Swallow  whole. 30 tablet 11   atorvastatin  (LIPITOR) 40 MG tablet Take 1 tablet (40 mg total) by mouth daily. (Patient taking differently: Take 40 mg by mouth every evening.) 30 tablet 0   calcitRIOL (ROCALTROL) 0.5 MCG capsule Take 0.5 mcg by mouth in the morning.     carvedilol  (COREG ) 12.5 MG tablet Take 1 tablet (12.5 mg total) by mouth 2 (two) times daily with a meal. 60 tablet 0   hydrALAZINE  (APRESOLINE ) 100 MG tablet Take 100 mg by mouth 2 (two) times daily.     isosorbide  mononitrate (IMDUR ) 120 MG 24 hr tablet Take 1 tablet (120 mg total) by mouth daily. 30 tablet 0   levETIRAcetam  (KEPPRA ) 500 MG tablet Take 1 tablet (500 mg total) by mouth daily. 30 tablet 5   MOUNJARO 2.5 MG/0.5ML Pen Inject 2.5 mg into the skin every Tuesday.     oxyCODONE -acetaminophen  (PERCOCET/ROXICET) 5-325 MG tablet Take 1 tablet by mouth every 6 (six) hours as needed for severe pain (pain score 7-10). 10 tablet 0   OXYGEN Inhale 2 L into the lungs continuous.     torsemide  (DEMADEX ) 20 MG tablet Take 60 mg by mouth in the morning.     levETIRAcetam  (KEPPRA ) 500 MG tablet Take 1 tablet (500 mg total) by mouth 2 (two) times daily. (Patient not taking: Reported on 05/07/2024) 60 tablet 0   No current facility-administered medications on file prior to visit.    LABS/IMAGING: No results found for this or any previous visit (from the past 48 hours). No results found.  LIPID PANEL: No results found for: CHOL, TRIG, HDL, CHOLHDL, VLDL, LDLCALC, LDLDIRECT  No results found for: LIPOA     WEIGHTS: Wt Readings from Last 3 Encounters:  05/07/24 191 lb 12.8 oz (87 kg)  04/28/24 193 lb 14.4 oz (88 kg)  10/23/23 197 lb (89.4 kg)    VITALS: BP 132/84   Pulse 91   Ht 5' (1.524 m)   Wt 191 lb 12.8 oz (87 kg)   SpO2 97%   BMI 37.46 kg/m   EXAM: General appearance: alert, no distress, and moderately obese Neck: no carotid bruit, no JVD, and thyroid not enlarged, symmetric, no  tenderness/mass/nodules Lungs: clear to auscultation bilaterally Heart: regular rate and rhythm Abdomen: soft, non-tender; bowel sounds normal; no masses,  no organomegaly and obese Extremities: extremities normal, atraumatic, no cyanosis or edema Pulses: 2+ and symmetric Left upper extremity fistula with positive thrill Skin: Skin color, texture, turgor normal. No rashes or lesions Neurologic: Grossly normal Psych: Slowness of thought, impaired hearing   EKG: EKG Interpretation Date/Time:  Thursday May 07 2024 09:07:58 EST Ventricular Rate:  91 PR Interval:  206 QRS Duration:  92 QT Interval:  378 QTC Calculation: 464 R Axis:   67  Text Interpretation: Normal sinus rhythm ST & T wave abnormality, consider lateral ischemia Prolonged QT When compared with ECG of 17-Sep-2023 14:27, Compared to previous tracing there are more pronounced lateral  ST and T wave changes Confirmed by Mona Kent [phone]) on 05/07/2024 9:15:54 AM - personally reviewed  ASSESSMENT: Chronic HFpEF, moderate RV dysfunction, NYHA class II symptoms ESRD on HD with left upper extremity fistula History of seizures Intellectual disability History of stroke with small PFO History of pericardial effusion Hypertension Type 2 diabetes  PLAN: 1.   Joanna [name] is seen today to reestablish care after coming back to [facility].  I had seen her in the hospital back in 2021 however she was living in [place] and was seen at least once over there by cardiology.  She has now progressed onto end-stage renal disease with a fistula and is on hemodialysis.  She had a stroke last year and echo showed small PFO, pericardial effusion and low normal LVEF with moderate RV dysfunction and restrictive filling.  Since being on dialysis she reports her symptoms have improved.  I would like to repeat an echo to compare to the prior year's echo.  There were some features concerning for possible amyloidosis including moderate LVH,  pericardial effusion, biatrial enlargement, etc.  If this persists or is worsening we may need to consider workup for this.  Plan follow-up with APP in 6 months or sooner as necessary.  Kent KYM Mona, MD, [facility], FNLA, FACP  [facility]  Medical Director of the Advanced Lipid Disorders &  Cardiovascular Risk Reduction Clinic Diplomate of the American Board of Clinical Lipidology Attending Cardiologist  Direct Dial : [phone]  Fax: [phone]  Website:  [email or web address] Kent BROCKS Lysa Livengood 05/07/2024, 9:28 AM

## 2024-05-07 NOTE — Patient Instructions (Addendum)
 Medication Instructions:   NO CHANGES  *If you need a refill on your cardiac medications before your next appointment, please call your pharmacy*  Testing/Procedures:  Your physician has requested that you have an echocardiogram. Echocardiography is a painless test that uses sound waves to create images of your heart. It provides your doctor with information about the size and shape of your heart and how well your heart's chambers and valves are working. This procedure takes approximately one hour. There are no restrictions for this procedure. Please do NOT wear cologne, perfume, aftershave, or lotions (deodorant is allowed). Please arrive 15 minutes prior to your appointment time.  Please note: We ask at that you not bring children with you during ultrasound (echo/ vascular) testing. Due to room size and safety concerns, children are not allowed in the ultrasound rooms during exams. Our front office staff cannot provide observation of children in our lobby area while testing is being conducted. An adult accompanying a patient to their appointment will only be allowed in the ultrasound room at the discretion of the ultrasound technician under special circumstances. We apologize for any inconvenience.   Follow-Up: At [facility], you and your health needs are our priority.  As part of our continuing mission to provide you with exceptional heart care, our providers are all part of one team.  This team includes your primary Cardiologist (physician) and Advanced Practice Providers or APPs (Physician Assistants and Nurse Practitioners) who all work together to provide you with the care you need, when you need it.  Your next appointment:    6 months with PA or NP  We recommend signing up for the patient portal called MyChart.  Sign up information is provided on this After Visit Summary.  MyChart is used to connect with patients for Virtual Visits (Telemedicine).  Patients are able to view  lab/test results, encounter notes, upcoming appointments, etc.  Non-urgent messages can be sent to your provider as well.   To learn more about what you can do with MyChart, go to [email or web address].

## 2024-05-14 ENCOUNTER — Emergency Department (HOSPITAL_COMMUNITY)
Admission: EM | Admit: 2024-05-14 | Discharge: 2024-05-14 | Disposition: A | Discharge status: Home / Self Care | Attending: Emergency Medicine | Admitting: Emergency Medicine

## 2024-05-14 ENCOUNTER — Encounter (HOSPITAL_COMMUNITY): Payer: Self-pay | Admitting: Emergency Medicine

## 2024-05-14 ENCOUNTER — Other Ambulatory Visit: Payer: Self-pay

## 2024-05-14 ENCOUNTER — Emergency Department (HOSPITAL_COMMUNITY)

## 2024-05-14 DIAGNOSIS — M545 Low back pain, unspecified: Secondary | ICD-10-CM | POA: Insufficient documentation

## 2024-05-14 DIAGNOSIS — N186 End stage renal disease: Secondary | ICD-10-CM | POA: Insufficient documentation

## 2024-05-14 DIAGNOSIS — Z7982 Long term (current) use of aspirin: Secondary | ICD-10-CM | POA: Insufficient documentation

## 2024-05-14 DIAGNOSIS — R569 Unspecified convulsions: Secondary | ICD-10-CM | POA: Diagnosis present

## 2024-05-14 LAB — COMPREHENSIVE METABOLIC PANEL WITH GFR
ALT: 16 U/L (ref 0–44)
AST: 22 U/L (ref 15–41)
Albumin: 4.5 g/dL (ref 3.5–5.0)
Alkaline Phosphatase: 46 U/L (ref 38–126)
Anion gap: 17 — ABNORMAL HIGH (ref 5–15)
BUN: 104 mg/dL — ABNORMAL HIGH (ref 6–20)
CO2: 23 mmol/L (ref 22–32)
Calcium: 11 mg/dL — ABNORMAL HIGH (ref 8.9–10.3)
Chloride: 96 mmol/L — ABNORMAL LOW (ref 98–111)
Creatinine, Ser: 5.47 mg/dL — ABNORMAL HIGH (ref 0.44–1.00)
GFR, Estimated: 9 mL/min — ABNORMAL LOW (ref >60)
Glucose, Bld: 128 mg/dL — ABNORMAL HIGH (ref 70–99)
Potassium: 4 mmol/L (ref 3.5–5.1)
Sodium: 136 mmol/L (ref 135–145)
Total Bilirubin: 0.4 mg/dL (ref 0.0–1.2)
Total Protein: 8.1 g/dL (ref 6.5–8.1)

## 2024-05-14 LAB — I-STAT CHEM 8, ED
BUN: 104 mg/dL — ABNORMAL HIGH (ref 6–20)
Calcium, Ion: 1.27 mmol/L (ref 1.15–1.40)
Chloride: 100 mmol/L (ref 98–111)
Creatinine, Ser: 5.9 mg/dL — ABNORMAL HIGH (ref 0.44–1.00)
Glucose, Bld: 127 mg/dL — ABNORMAL HIGH (ref 70–99)
HCT: 37 % (ref 36.0–46.0)
Hemoglobin: 12.6 g/dL (ref 12.0–15.0)
Potassium: 3.9 mmol/L (ref 3.5–5.1)
Sodium: 135 mmol/L (ref 135–145)
TCO2: 25 mmol/L (ref 22–32)

## 2024-05-14 LAB — CBC WITH DIFFERENTIAL/PLATELET
Abs Immature Granulocytes: 0.01 K/uL (ref 0.00–0.07)
Basophils Absolute: 0 K/uL (ref 0.0–0.1)
Basophils Relative: 1 %
Eosinophils Absolute: 0 K/uL (ref 0.0–0.5)
Eosinophils Relative: 1 %
HCT: 35.4 % — ABNORMAL LOW (ref 36.0–46.0)
Hemoglobin: 11.3 g/dL — ABNORMAL LOW (ref 12.0–15.0)
Immature Granulocytes: 0 %
Lymphocytes Relative: 10 %
Lymphs Abs: 0.5 K/uL — ABNORMAL LOW (ref 0.7–4.0)
MCH: 30.1 pg (ref 26.0–34.0)
MCHC: 31.9 g/dL (ref 30.0–36.0)
MCV: 94.1 fL (ref 80.0–100.0)
Monocytes Absolute: 0.5 K/uL (ref 0.1–1.0)
Monocytes Relative: 11 %
Neutro Abs: 3.9 K/uL (ref 1.7–7.7)
Neutrophils Relative %: 77 %
Platelets: 250 K/uL (ref 150–400)
RBC: 3.76 MIL/uL — ABNORMAL LOW (ref 3.87–5.11)
RDW: 17.9 % — ABNORMAL HIGH (ref 11.5–15.5)
WBC: 5 K/uL (ref 4.0–10.5)
nRBC: 0 % (ref 0.0–0.2)

## 2024-05-14 LAB — URINALYSIS, W/ REFLEX TO CULTURE (INFECTION SUSPECTED)
Bacteria, UA: NONE SEEN
Bilirubin Urine: NEGATIVE
Glucose, UA: NEGATIVE mg/dL
Hgb urine dipstick: NEGATIVE
Ketones, ur: NEGATIVE mg/dL
Leukocytes,Ua: NEGATIVE
Nitrite: NEGATIVE
Protein, ur: 100 mg/dL — AB
Specific Gravity, Urine: 1.008 (ref 1.005–1.030)
pH: 5 (ref 5.0–8.0)

## 2024-05-14 LAB — URINE DRUG SCREEN
Amphetamines: NEGATIVE
Barbiturates: NEGATIVE
Benzodiazepines: NEGATIVE
Cocaine: NEGATIVE
Fentanyl: NEGATIVE
Methadone Scn, Ur: NEGATIVE
Opiates: NEGATIVE
Tetrahydrocannabinol: NEGATIVE

## 2024-05-14 LAB — I-STAT CG4 LACTIC ACID, ED: Lactic Acid, Venous: 0.8 mmol/L (ref 0.5–1.9)

## 2024-05-14 LAB — CBG MONITORING, ED: Glucose-Capillary: 123 mg/dL — ABNORMAL HIGH (ref 70–99)

## 2024-05-14 LAB — ETHANOL: Alcohol, Ethyl (B): <15 mg/dL (ref <15)

## 2024-05-14 MED ORDER — LORAZEPAM 2 MG/ML IJ SOLN
1.0000 mg | Freq: Once | INTRAMUSCULAR | Status: AC
  Administered 2024-05-14: 1 mg via INTRAVENOUS
  Filled 2024-05-14: qty 1

## 2024-05-14 MED ORDER — LACTATED RINGERS IV SOLN: INTRAVENOUS | Status: DC

## 2024-05-14 MED ORDER — MIDAZOLAM HCL 2 MG/2ML IJ SOLN
INTRAMUSCULAR | Status: AC
  Filled 2024-05-14: qty 6

## 2024-05-14 MED ORDER — LACTATED RINGERS IV BOLUS: 1000.0000 mL | Freq: Once | INTRAVENOUS | Status: DC

## 2024-05-14 NOTE — ED Notes (Addendum)
 Pt experienced seizure for about 1.5 minutes

## 2024-05-14 NOTE — ED Provider Notes (Signed)
 [place] EMERGENCY DEPARTMENT AT [facility] Provider Note   CSN: [id number] Arrival date & time: 05/14/24  1421     Patient presents with: Back Pain and Seizures   Joanna [name] is a 53 y.o. female.   53 year old female with history of ESRD, seizure-like activity who presents after having seizure-like activity at home today.  History obtained from patient as well as from relative at bedside.  Patient was cooking dinner and complained of having left-sided back pain.  She had been in her baseline state of health prior to this.  She takes Keppra  and has been compliant.  Relative describes approximately 2 to 3-minute history of generalized tonic-clonic activity.  Review of patient's old chart shows that back in April of this year she had a EEG which was negative for seizure activity.  No reported history of trauma from her seizure today.  Last seizure was about a week ago according to her.  No recent changes to her medications.  Relative does note patient has been under a great deal of stress recently       Prior to Admission medications   Medication Sig Start Date End Date Taking? Authorizing Provider  acetaminophen  (TYLENOL ) 500 MG tablet Take 500-1,000 mg by mouth every 6 (six) hours as needed for mild pain (pain score 1-3) (back pain).    [provider]  allopurinol  (ZYLOPRIM ) 100 MG tablet Take 50 mg by mouth in the morning. 06/07/23   [provider]  amLODipine  (NORVASC ) 10 MG tablet Take 1 tablet (10 mg total) by mouth daily. 02/05/23 05/07/24  Raenelle Coria, MD  aspirin  EC 81 MG EC tablet Take 1 tablet (81 mg total) by mouth daily. Swallow whole. 12/23/19   Cindy Garnette POUR, MD  atorvastatin  (LIPITOR) 40 MG tablet Take 1 tablet (40 mg total) by mouth daily. Patient taking differently: Take 40 mg by mouth every evening. 02/05/23 05/07/24  Ghimire, Kuber, MD  calcitRIOL (ROCALTROL) 0.5 MCG capsule Take 0.5 mcg by mouth in the morning. 07/08/23   [provider]  carvedilol  (COREG ) 12.5 MG tablet Take 1 tablet (12.5 mg total) by mouth 2 (two) times daily with a meal. 02/04/23 05/07/24  Raenelle Coria, MD  hydrALAZINE  (APRESOLINE ) 100 MG tablet Take 100 mg by mouth 2 (two) times daily.    [provider]  isosorbide  mononitrate (IMDUR ) 120 MG 24 hr tablet Take 1 tablet (120 mg total) by mouth daily. 12/23/19 05/07/24  Cindy Garnette POUR, MD  levETIRAcetam  (KEPPRA ) 500 MG tablet Take 1 tablet (500 mg total) by mouth daily. 09/18/23   Amoako, Prince, MD  levETIRAcetam  (KEPPRA ) 500 MG tablet Take 1 tablet (500 mg total) by mouth 2 (two) times daily. Patient not taking: Reported on 05/07/2024 04/19/24   Countryman, Chase, MD  MOUNJARO 2.5 MG/0.5ML Pen Inject 2.5 mg into the skin every Tuesday. 08/13/23   [provider]  oxyCODONE -acetaminophen  (PERCOCET/ROXICET) 5-325 MG tablet Take 1 tablet by mouth every 6 (six) hours as needed for severe pain (pain score 7-10). 04/19/24   Countryman, Chase, MD  OXYGEN Inhale 2 L into the lungs continuous.    [provider]  torsemide  (DEMADEX ) 20 MG tablet Take 60 mg by mouth in the morning.    [provider]    Allergies: Patient has no known allergies.    Review of Systems  All other systems reviewed and are negative.   Updated Vital Signs BP (!) 152/76   Pulse 90   Temp (!) 97.4  F (36.3 C) (Oral)   Resp 18   SpO2 97%   Physical Exam Vitals and nursing note reviewed.  Constitutional:      General: She is not in acute distress.    Appearance: Normal appearance. She is well-developed. She is not toxic-appearing.  HENT:     Head: Normocephalic and atraumatic.  Eyes:     General: Lids are normal.     Conjunctiva/sclera: Conjunctivae normal.     Pupils: Pupils are equal, round, and reactive to light.  Neck:     Thyroid: No thyroid mass.     Trachea: No tracheal deviation.  Cardiovascular:     Rate and Rhythm: Normal rate and regular rhythm.     Heart sounds:  Normal heart sounds. No murmur heard.    No gallop.  Pulmonary:     Effort: Pulmonary effort is normal. No respiratory distress.     Breath sounds: Normal breath sounds. No stridor. No decreased breath sounds, wheezing, rhonchi or rales.  Abdominal:     General: There is no distension.     Palpations: Abdomen is soft.     Tenderness: There is no abdominal tenderness. There is no rebound.  Musculoskeletal:        General: No tenderness. Normal range of motion.     Cervical back: Normal range of motion and neck supple.  Skin:    General: Skin is warm and dry.     Findings: No abrasion or rash.  Neurological:     General: No focal deficit present.     Mental Status: She is alert and oriented to person, place, and time. Mental status is at baseline.     GCS: GCS eye subscore is 4. GCS verbal subscore is 5. GCS motor subscore is 6.     Cranial Nerves: No cranial nerve deficit.     Sensory: No sensory deficit.     Motor: Tremor present.  Psychiatric:        Attention and Perception: Attention normal.        Speech: Speech normal.        Behavior: Behavior normal.     (all labs ordered are listed, but only abnormal results are displayed) Labs Reviewed  CBG MONITORING, ED - Abnormal; Notable for the following components:      Result Value   Glucose-Capillary 123 (*)    All other components within normal limits  CBC WITH DIFFERENTIAL/PLATELET  COMPREHENSIVE METABOLIC PANEL WITH GFR  URINALYSIS, W/ REFLEX TO CULTURE (INFECTION SUSPECTED)  URINE DRUG SCREEN  ETHANOL  I-STAT CHEM 8, ED  I-STAT CG4 LACTIC ACID, ED    EKG: None  Radiology: No results found.   Procedures   Medications Ordered in the ED  midazolam  (VERSED ) 2 MG/2ML injection (has no administration in time range)  LORazepam  (ATIVAN ) injection 1 mg (has no administration in time range)  lactated ringers  infusion (has no administration in time range)                                    Medical Decision  Making Amount and/or Complexity of Data Reviewed Labs: ordered. Radiology: ordered.  Risk Prescription drug management.   Patient with seizure-like activity here while in the department.  Patient was not postictal afterwards.  Will work this does show she had a normal EEG about 7 months ago.  She is on Keppra .  Head CT and labs otherwise reassuring.  Have been complaining of back pain.  Urinalysis and renal CT without acute findings.  Patient was given Ativan .  She was allowed to sleep and when she awoke she is back at her baseline.  Do not feel that patient was in status.  Drug screen was negative.  Patient with a relative who feels comfortable taking her home.  Patient instructed to follow-up with her doctor     Final diagnoses:  None    ED Discharge Orders     None          Dasie Faden, MD 05/14/24 [phone]

## 2024-05-14 NOTE — ED Notes (Addendum)
 Pt experiencing episodes of sleep apnea and de-satting to 82 for a few seconds. Placed on 2L [place] for support

## 2024-05-14 NOTE — ED Triage Notes (Signed)
 Patient presents due to back pain and a seizure. She had a seizure 45 mins ago. Family unable to provide information on post ictal phase. While in triage the patient seemed lethargic. She was A&O x3. She was unable to give the correct month, but knew today is Thanksgiving. 6 weeks ago she was seen for same issue.

## 2024-05-14 NOTE — Discharge Instructions (Signed)
 Follow up with your doctor next week

## 2024-05-14 NOTE — ED Notes (Signed)
 Assisted patient via wheelchair to restroom, urine sample collected and sent to lab.

## 2024-05-18 ENCOUNTER — Inpatient Hospital Stay (HOSPITAL_COMMUNITY): Admission: RE | Admit: 2024-05-18 | Source: Ambulatory Visit

## 2024-05-20 ENCOUNTER — Inpatient Hospital Stay (HOSPITAL_COMMUNITY)
Admission: RE | Admit: 2024-05-20 | Discharge: 2024-05-20 | Disposition: A | Source: Ambulatory Visit | Attending: Internal Medicine | Admitting: Internal Medicine

## 2024-05-20 VITALS — BP 110/72 | HR 88 | Temp 97.9°F | Resp 16

## 2024-05-20 DIAGNOSIS — N185 Chronic kidney disease, stage 5: Secondary | ICD-10-CM | POA: Insufficient documentation

## 2024-05-20 DIAGNOSIS — D631 Anemia in chronic kidney disease: Secondary | ICD-10-CM | POA: Diagnosis present

## 2024-05-20 LAB — POCT HEMOGLOBIN-HEMACUE: Hemoglobin: 10.5 g/dL — ABNORMAL LOW (ref 12.0–15.0)

## 2024-05-20 MED ORDER — EPOETIN ALFA-EPBX 40000 UNIT/ML IJ SOLN
40000.0000 [IU] | Freq: Once | INTRAMUSCULAR | Status: AC
  Administered 2024-05-20: 40000 [IU] via SUBCUTANEOUS

## 2024-05-20 MED ORDER — EPOETIN ALFA-EPBX 20000 UNIT/ML IJ SOLN
20000.0000 [IU] | Freq: Once | INTRAMUSCULAR | Status: AC
  Administered 2024-05-20: 20000 [IU] via SUBCUTANEOUS

## 2024-05-20 MED ORDER — EPOETIN ALFA-EPBX 40000 UNIT/ML IJ SOLN
INTRAMUSCULAR | Status: AC
  Filled 2024-05-20: qty 1

## 2024-05-20 MED ORDER — EPOETIN ALFA-EPBX 20000 UNIT/ML IJ SOLN
INTRAMUSCULAR | Status: AC
  Filled 2024-05-20: qty 1

## 2024-06-01 ENCOUNTER — Encounter (HOSPITAL_COMMUNITY)

## 2024-06-03 ENCOUNTER — Ambulatory Visit (HOSPITAL_COMMUNITY)

## 2024-06-03 VITALS — BP 128/69 | HR 91 | Temp 97.7°F | Resp 16

## 2024-06-03 DIAGNOSIS — D631 Anemia in chronic kidney disease: Secondary | ICD-10-CM

## 2024-06-03 DIAGNOSIS — N185 Chronic kidney disease, stage 5: Secondary | ICD-10-CM | POA: Diagnosis not present

## 2024-06-03 LAB — RENAL FUNCTION PANEL
Albumin: 4.2 g/dL (ref 3.5–5.0)
Anion gap: 19 — ABNORMAL HIGH (ref 5–15)
BUN: 93 mg/dL — ABNORMAL HIGH (ref 6–20)
CO2: 18 mmol/L — ABNORMAL LOW (ref 22–32)
Calcium: 10.1 mg/dL (ref 8.9–10.3)
Chloride: 101 mmol/L (ref 98–111)
Creatinine, Ser: 5.25 mg/dL — ABNORMAL HIGH (ref 0.44–1.00)
GFR, Estimated: 9 mL/min — ABNORMAL LOW (ref >60)
Glucose, Bld: 137 mg/dL — ABNORMAL HIGH (ref 70–99)
Phosphorus: 5.4 mg/dL — ABNORMAL HIGH (ref 2.5–4.6)
Potassium: 3.7 mmol/L (ref 3.5–5.1)
Sodium: 138 mmol/L (ref 135–145)

## 2024-06-03 LAB — IRON AND TIBC
Iron: 80 ug/dL (ref 28–170)
Saturation Ratios: 38 % — ABNORMAL HIGH (ref 10.4–31.8)
TIBC: 213 ug/dL — ABNORMAL LOW (ref 250–450)
UIBC: 133 ug/dL

## 2024-06-03 LAB — POCT HEMOGLOBIN-HEMACUE: Hemoglobin: 10.6 g/dL — ABNORMAL LOW (ref 12.0–15.0)

## 2024-06-03 LAB — FERRITIN: Ferritin: 607 ng/mL — ABNORMAL HIGH (ref 11–307)

## 2024-06-03 MED ORDER — EPOETIN ALFA-EPBX 40000 UNIT/ML IJ SOLN
INTRAMUSCULAR | Status: AC
  Filled 2024-06-03: qty 1

## 2024-06-03 MED ORDER — EPOETIN ALFA-EPBX 40000 UNIT/ML IJ SOLN
40000.0000 [IU] | Freq: Once | INTRAMUSCULAR | Status: AC
  Administered 2024-06-03: 09:00:00 40000 [IU] via SUBCUTANEOUS

## 2024-06-03 MED ORDER — EPOETIN ALFA-EPBX 20000 UNIT/ML IJ SOLN
20000.0000 [IU] | Freq: Once | INTRAMUSCULAR | Status: AC
  Administered 2024-06-03: 09:00:00 20000 [IU] via SUBCUTANEOUS

## 2024-06-03 MED ORDER — EPOETIN ALFA-EPBX 20000 UNIT/ML IJ SOLN
INTRAMUSCULAR | Status: AC
  Filled 2024-06-03: qty 1

## 2024-06-12 ENCOUNTER — Ambulatory Visit (HOSPITAL_COMMUNITY): Admission: RE | Admit: 2024-06-12 | Source: Ambulatory Visit

## 2024-06-16 ENCOUNTER — Telehealth (HOSPITAL_COMMUNITY): Payer: Self-pay

## 2024-06-16 NOTE — Telephone Encounter (Signed)
 Auth Submission: NO AUTH NEEDED Site of care: Site of care: [facility] Payer: [employer] Medicare Medication & CPT/J Code(s) submitted: Retacrit  (V4893) Diagnosis Code: N18.5/D63.1 Route of submission (phone, fax, portal): portal Phone # Fax # Auth type: Buy/Bill HB Units/visits requested: 60000 units q2weeks Reference number: [id number] Approval from: 06/18/24 to 06/17/25

## 2024-06-17 ENCOUNTER — Inpatient Hospital Stay (HOSPITAL_COMMUNITY): Admission: RE | Admit: 2024-06-17 | Source: Ambulatory Visit

## 2024-06-26 ENCOUNTER — Encounter: Payer: Self-pay | Admitting: Internal Medicine

## 2024-06-26 ENCOUNTER — Encounter (HOSPITAL_COMMUNITY)
Admission: RE | Admit: 2024-06-26 | Discharge: 2024-06-26 | Disposition: A | Discharge status: Home / Self Care | Source: Ambulatory Visit | Attending: Internal Medicine | Admitting: Internal Medicine

## 2024-06-26 VITALS — BP 127/65 | HR 95 | Temp 97.0°F | Resp 16

## 2024-06-26 DIAGNOSIS — N185 Chronic kidney disease, stage 5: Secondary | ICD-10-CM | POA: Insufficient documentation

## 2024-06-26 DIAGNOSIS — D631 Anemia in chronic kidney disease: Secondary | ICD-10-CM | POA: Insufficient documentation

## 2024-06-26 LAB — POCT HEMOGLOBIN-HEMACUE: Hemoglobin: 9.9 g/dL — ABNORMAL LOW (ref 12.0–15.0)

## 2024-06-26 MED ORDER — EPOETIN ALFA-EPBX 40000 UNIT/ML IJ SOLN
40000.0000 [IU] | Freq: Once | INTRAMUSCULAR | Status: AC
  Administered 2024-06-26: 40000 [IU] via SUBCUTANEOUS

## 2024-06-26 MED ORDER — EPOETIN ALFA-EPBX 40000 UNIT/ML IJ SOLN
INTRAMUSCULAR | Status: AC
  Filled 2024-06-26: qty 1

## 2024-06-26 MED ORDER — EPOETIN ALFA-EPBX 20000 UNIT/ML IJ SOLN
INTRAMUSCULAR | Status: AC
  Filled 2024-06-26: qty 1

## 2024-06-26 MED ORDER — EPOETIN ALFA-EPBX 20000 UNIT/ML IJ SOLN
20000.0000 [IU] | Freq: Once | INTRAMUSCULAR | Status: AC
  Administered 2024-06-26: 20000 [IU] via SUBCUTANEOUS

## 2024-07-01 ENCOUNTER — Encounter (HOSPITAL_COMMUNITY)

## 2024-07-10 ENCOUNTER — Encounter (HOSPITAL_COMMUNITY): Admission: RE | Admit: 2024-07-10 | Source: Ambulatory Visit

## 2024-07-10 VITALS — BP 127/65 | HR 87 | Temp 97.2°F | Resp 15

## 2024-07-10 DIAGNOSIS — N185 Chronic kidney disease, stage 5: Secondary | ICD-10-CM | POA: Diagnosis not present

## 2024-07-10 DIAGNOSIS — D631 Anemia in chronic kidney disease: Secondary | ICD-10-CM

## 2024-07-10 LAB — IRON AND TIBC
Iron: 70 ug/dL (ref 28–170)
Saturation Ratios: 30 % (ref 10.4–31.8)
TIBC: 235 ug/dL — ABNORMAL LOW (ref 250–450)
UIBC: 166 ug/dL

## 2024-07-10 LAB — FERRITIN: Ferritin: 447 ng/mL — ABNORMAL HIGH (ref 11–307)

## 2024-07-10 LAB — RENAL FUNCTION PANEL
Albumin: 4.4 g/dL (ref 3.5–5.0)
Anion gap: 18 — ABNORMAL HIGH (ref 5–15)
BUN: 92 mg/dL — ABNORMAL HIGH (ref 6–20)
CO2: 21 mmol/L — ABNORMAL LOW (ref 22–32)
Calcium: 10.6 mg/dL — ABNORMAL HIGH (ref 8.9–10.3)
Chloride: 97 mmol/L — ABNORMAL LOW (ref 98–111)
Creatinine, Ser: 5.86 mg/dL — ABNORMAL HIGH (ref 0.44–1.00)
GFR, Estimated: 8 mL/min — ABNORMAL LOW
Glucose, Bld: 133 mg/dL — ABNORMAL HIGH (ref 70–99)
Phosphorus: 6.8 mg/dL — ABNORMAL HIGH (ref 2.5–4.6)
Potassium: 3.9 mmol/L (ref 3.5–5.1)
Sodium: 137 mmol/L (ref 135–145)

## 2024-07-10 LAB — POCT HEMOGLOBIN-HEMACUE: Hemoglobin: 10.5 g/dL — ABNORMAL LOW (ref 12.0–15.0)

## 2024-07-10 MED ORDER — EPOETIN ALFA-EPBX 40000 UNIT/ML IJ SOLN
INTRAMUSCULAR | Status: AC
  Filled 2024-07-10: qty 1

## 2024-07-10 MED ORDER — EPOETIN ALFA-EPBX 40000 UNIT/ML IJ SOLN
40000.0000 [IU] | Freq: Once | INTRAMUSCULAR | Status: AC
  Administered 2024-07-10: 40000 [IU] via SUBCUTANEOUS

## 2024-07-10 MED ORDER — EPOETIN ALFA-EPBX 20000 UNIT/ML IJ SOLN
20000.0000 [IU] | Freq: Once | INTRAMUSCULAR | Status: AC
  Administered 2024-07-10: 20000 [IU] via SUBCUTANEOUS

## 2024-07-10 MED ORDER — EPOETIN ALFA-EPBX 20000 UNIT/ML IJ SOLN
INTRAMUSCULAR | Status: AC
  Filled 2024-07-10: qty 1

## 2024-07-24 ENCOUNTER — Inpatient Hospital Stay (HOSPITAL_COMMUNITY)
Admission: RE | Admit: 2024-07-24 | Disposition: A | Source: Ambulatory Visit | Attending: Internal Medicine | Admitting: Internal Medicine

## 2024-07-24 VITALS — BP 134/69 | HR 87 | Temp 97.8°F | Resp 16

## 2024-07-24 DIAGNOSIS — D631 Anemia in chronic kidney disease: Secondary | ICD-10-CM

## 2024-07-24 LAB — POCT HEMOGLOBIN-HEMACUE: Hemoglobin: 10.9 g/dL — ABNORMAL LOW (ref 12.0–15.0)

## 2024-07-24 MED ORDER — EPOETIN ALFA-EPBX 40000 UNIT/ML IJ SOLN
INTRAMUSCULAR | Status: AC
  Filled 2024-07-24: qty 1

## 2024-07-24 MED ORDER — EPOETIN ALFA-EPBX 40000 UNIT/ML IJ SOLN
40000.0000 [IU] | Freq: Once | INTRAMUSCULAR | Status: AC
  Administered 2024-07-24: 40000 [IU] via SUBCUTANEOUS

## 2024-07-24 MED ORDER — EPOETIN ALFA-EPBX 20000 UNIT/ML IJ SOLN
INTRAMUSCULAR | Status: AC
  Filled 2024-07-24: qty 1

## 2024-07-24 MED ORDER — EPOETIN ALFA-EPBX 20000 UNIT/ML IJ SOLN
20000.0000 [IU] | Freq: Once | INTRAMUSCULAR | Status: AC
  Administered 2024-07-24: 20000 [IU] via SUBCUTANEOUS

## 2024-08-07 ENCOUNTER — Encounter (HOSPITAL_COMMUNITY)
